# Patient Record
Sex: Female | Born: 1970 | Race: Black or African American | Hispanic: No | Marital: Married | Smoking: Never smoker
Health system: Southern US, Community
[De-identification: ages and names within clinical notes are randomized; demographics above are authoritative.]

## PROBLEM LIST (undated history)

## (undated) DIAGNOSIS — D649 Anemia, unspecified: Secondary | ICD-10-CM

## (undated) DIAGNOSIS — I2510 Atherosclerotic heart disease of native coronary artery without angina pectoris: Secondary | ICD-10-CM

## (undated) DIAGNOSIS — D219 Benign neoplasm of connective and other soft tissue, unspecified: Secondary | ICD-10-CM

## (undated) DIAGNOSIS — M549 Dorsalgia, unspecified: Secondary | ICD-10-CM

## (undated) DIAGNOSIS — F419 Anxiety disorder, unspecified: Secondary | ICD-10-CM

## (undated) DIAGNOSIS — R87629 Unspecified abnormal cytological findings in specimens from vagina: Secondary | ICD-10-CM

## (undated) DIAGNOSIS — G4730 Sleep apnea, unspecified: Secondary | ICD-10-CM

## (undated) DIAGNOSIS — G43909 Migraine, unspecified, not intractable, without status migrainosus: Secondary | ICD-10-CM

## (undated) DIAGNOSIS — K219 Gastro-esophageal reflux disease without esophagitis: Secondary | ICD-10-CM

## (undated) DIAGNOSIS — K429 Umbilical hernia without obstruction or gangrene: Secondary | ICD-10-CM

## (undated) DIAGNOSIS — I509 Heart failure, unspecified: Secondary | ICD-10-CM

## (undated) DIAGNOSIS — L309 Dermatitis, unspecified: Secondary | ICD-10-CM

## (undated) DIAGNOSIS — I10 Essential (primary) hypertension: Secondary | ICD-10-CM

## (undated) HISTORY — DX: Benign neoplasm of connective and other soft tissue, unspecified: D21.9

## (undated) HISTORY — DX: Anemia, unspecified: D64.9

## (undated) HISTORY — DX: Essential (primary) hypertension: I10

## (undated) HISTORY — DX: Dorsalgia, unspecified: M54.9

## (undated) HISTORY — PX: TUBAL LIGATION: SHX77

## (undated) HISTORY — DX: Unspecified abnormal cytological findings in specimens from vagina: R87.629

## (undated) HISTORY — DX: Gastro-esophageal reflux disease without esophagitis: K21.9

---

## 1998-07-03 ENCOUNTER — Inpatient Hospital Stay (HOSPITAL_COMMUNITY): Admission: AD | Admit: 1998-07-03 | Discharge: 1998-07-03 | Payer: Self-pay | Admitting: Obstetrics

## 1998-07-03 ENCOUNTER — Encounter: Payer: Self-pay | Admitting: Obstetrics

## 1998-07-06 ENCOUNTER — Encounter (HOSPITAL_COMMUNITY): Admission: RE | Admit: 1998-07-06 | Discharge: 1998-10-04 | Payer: Self-pay | Admitting: Obstetrics & Gynecology

## 1998-09-05 ENCOUNTER — Encounter: Admission: RE | Admit: 1998-09-05 | Discharge: 1998-09-05 | Payer: Self-pay | Admitting: Obstetrics & Gynecology

## 1998-09-19 ENCOUNTER — Encounter: Admission: RE | Admit: 1998-09-19 | Discharge: 1998-09-19 | Payer: Self-pay | Admitting: Obstetrics & Gynecology

## 1998-10-10 ENCOUNTER — Encounter: Admission: RE | Admit: 1998-10-10 | Discharge: 1998-10-10 | Payer: Self-pay | Admitting: Obstetrics & Gynecology

## 1998-10-17 ENCOUNTER — Encounter: Admission: RE | Admit: 1998-10-17 | Discharge: 1998-10-17 | Payer: Self-pay | Admitting: Obstetrics & Gynecology

## 1998-10-24 ENCOUNTER — Encounter: Admission: RE | Admit: 1998-10-24 | Discharge: 1998-10-24 | Payer: Self-pay | Admitting: Obstetrics & Gynecology

## 1998-11-07 ENCOUNTER — Ambulatory Visit (HOSPITAL_COMMUNITY): Admission: RE | Admit: 1998-11-07 | Discharge: 1998-11-07 | Payer: Self-pay | Admitting: Obstetrics & Gynecology

## 1998-11-07 ENCOUNTER — Encounter: Admission: RE | Admit: 1998-11-07 | Discharge: 1998-11-07 | Payer: Self-pay | Admitting: Obstetrics & Gynecology

## 1998-11-14 ENCOUNTER — Encounter: Admission: RE | Admit: 1998-11-14 | Discharge: 1998-11-14 | Payer: Self-pay | Admitting: Obstetrics & Gynecology

## 1998-11-20 ENCOUNTER — Observation Stay (HOSPITAL_COMMUNITY): Admission: AD | Admit: 1998-11-20 | Discharge: 1998-11-22 | Payer: Self-pay | Admitting: *Deleted

## 1998-11-21 ENCOUNTER — Encounter: Payer: Self-pay | Admitting: *Deleted

## 1998-11-26 ENCOUNTER — Ambulatory Visit (HOSPITAL_COMMUNITY): Admission: RE | Admit: 1998-11-26 | Discharge: 1998-11-26 | Payer: Self-pay | Admitting: Obstetrics

## 1998-11-28 ENCOUNTER — Encounter: Admission: RE | Admit: 1998-11-28 | Discharge: 1998-11-28 | Payer: Self-pay | Admitting: Obstetrics & Gynecology

## 1998-12-01 ENCOUNTER — Inpatient Hospital Stay (HOSPITAL_COMMUNITY): Admission: AD | Admit: 1998-12-01 | Discharge: 1998-12-01 | Payer: Self-pay | Admitting: *Deleted

## 1998-12-05 ENCOUNTER — Encounter: Admission: RE | Admit: 1998-12-05 | Discharge: 1998-12-05 | Payer: Self-pay | Admitting: Obstetrics & Gynecology

## 1998-12-11 ENCOUNTER — Inpatient Hospital Stay (HOSPITAL_COMMUNITY): Admission: AD | Admit: 1998-12-11 | Discharge: 1998-12-11 | Payer: Self-pay | Admitting: *Deleted

## 1998-12-12 ENCOUNTER — Encounter: Admission: RE | Admit: 1998-12-12 | Discharge: 1998-12-12 | Payer: Self-pay | Admitting: Obstetrics & Gynecology

## 1998-12-17 ENCOUNTER — Encounter (HOSPITAL_COMMUNITY): Admission: RE | Admit: 1998-12-17 | Discharge: 1998-12-27 | Payer: Self-pay | Admitting: Obstetrics & Gynecology

## 1998-12-19 ENCOUNTER — Ambulatory Visit (HOSPITAL_COMMUNITY): Admission: AD | Admit: 1998-12-19 | Discharge: 1998-12-19 | Payer: Self-pay | Admitting: Obstetrics

## 1998-12-19 ENCOUNTER — Encounter: Admission: RE | Admit: 1998-12-19 | Discharge: 1998-12-19 | Payer: Self-pay | Admitting: Obstetrics & Gynecology

## 1998-12-26 ENCOUNTER — Encounter: Admission: RE | Admit: 1998-12-26 | Discharge: 1998-12-26 | Payer: Self-pay | Admitting: Obstetrics & Gynecology

## 1998-12-26 ENCOUNTER — Inpatient Hospital Stay (HOSPITAL_COMMUNITY): Admission: AD | Admit: 1998-12-26 | Discharge: 1998-12-29 | Payer: Self-pay | Admitting: *Deleted

## 2000-04-10 ENCOUNTER — Other Ambulatory Visit: Admission: RE | Admit: 2000-04-10 | Discharge: 2000-04-10 | Payer: Self-pay | Admitting: Obstetrics and Gynecology

## 2000-10-01 ENCOUNTER — Other Ambulatory Visit: Admission: RE | Admit: 2000-10-01 | Discharge: 2000-10-01 | Payer: Self-pay | Admitting: *Deleted

## 2000-11-07 ENCOUNTER — Inpatient Hospital Stay (HOSPITAL_COMMUNITY): Admission: AD | Admit: 2000-11-07 | Discharge: 2000-11-07 | Payer: Self-pay | Admitting: Obstetrics & Gynecology

## 2000-11-10 ENCOUNTER — Inpatient Hospital Stay (HOSPITAL_COMMUNITY): Admission: AD | Admit: 2000-11-10 | Discharge: 2000-11-10 | Payer: Self-pay | Admitting: Obstetrics and Gynecology

## 2001-01-17 ENCOUNTER — Inpatient Hospital Stay (HOSPITAL_COMMUNITY): Admission: AD | Admit: 2001-01-17 | Discharge: 2001-01-17 | Payer: Self-pay | Admitting: Obstetrics and Gynecology

## 2001-02-05 ENCOUNTER — Inpatient Hospital Stay (HOSPITAL_COMMUNITY): Admission: AD | Admit: 2001-02-05 | Discharge: 2001-02-08 | Payer: Self-pay | Admitting: *Deleted

## 2001-02-05 ENCOUNTER — Encounter (INDEPENDENT_AMBULATORY_CARE_PROVIDER_SITE_OTHER): Payer: Self-pay

## 2002-11-17 ENCOUNTER — Emergency Department (HOSPITAL_COMMUNITY): Admission: EM | Admit: 2002-11-17 | Discharge: 2002-11-18 | Payer: Self-pay | Admitting: Emergency Medicine

## 2003-01-17 ENCOUNTER — Encounter: Admission: RE | Admit: 2003-01-17 | Discharge: 2003-01-17 | Payer: Self-pay | Admitting: Sports Medicine

## 2003-02-07 ENCOUNTER — Encounter: Admission: RE | Admit: 2003-02-07 | Discharge: 2003-02-07 | Payer: Self-pay | Admitting: Family Medicine

## 2003-04-25 ENCOUNTER — Encounter: Admission: RE | Admit: 2003-04-25 | Discharge: 2003-04-25 | Payer: Self-pay | Admitting: Family Medicine

## 2003-05-09 ENCOUNTER — Encounter: Admission: RE | Admit: 2003-05-09 | Discharge: 2003-05-09 | Payer: Self-pay | Admitting: Family Medicine

## 2003-07-13 ENCOUNTER — Encounter: Admission: RE | Admit: 2003-07-13 | Discharge: 2003-07-13 | Payer: Self-pay | Admitting: Family Medicine

## 2003-07-18 ENCOUNTER — Encounter: Admission: RE | Admit: 2003-07-18 | Discharge: 2003-07-18 | Payer: Self-pay | Admitting: Sports Medicine

## 2003-07-19 ENCOUNTER — Encounter: Admission: RE | Admit: 2003-07-19 | Discharge: 2003-07-19 | Payer: Self-pay | Admitting: Sports Medicine

## 2003-07-25 ENCOUNTER — Encounter: Admission: RE | Admit: 2003-07-25 | Discharge: 2003-07-25 | Payer: Self-pay | Admitting: Sports Medicine

## 2003-08-18 ENCOUNTER — Encounter: Admission: RE | Admit: 2003-08-18 | Discharge: 2003-08-18 | Payer: Self-pay | Admitting: Family Medicine

## 2003-10-03 ENCOUNTER — Encounter: Admission: RE | Admit: 2003-10-03 | Discharge: 2003-10-03 | Payer: Self-pay | Admitting: Sports Medicine

## 2003-10-05 ENCOUNTER — Encounter: Admission: RE | Admit: 2003-10-05 | Discharge: 2003-10-05 | Payer: Self-pay | Admitting: Family Medicine

## 2003-10-13 ENCOUNTER — Encounter: Admission: RE | Admit: 2003-10-13 | Discharge: 2003-10-13 | Payer: Self-pay | Admitting: Family Medicine

## 2003-11-23 ENCOUNTER — Emergency Department (HOSPITAL_COMMUNITY): Admission: EM | Admit: 2003-11-23 | Discharge: 2003-11-23 | Payer: Self-pay | Admitting: Emergency Medicine

## 2003-12-29 ENCOUNTER — Encounter: Admission: RE | Admit: 2003-12-29 | Discharge: 2003-12-29 | Payer: Self-pay | Admitting: Family Medicine

## 2004-04-19 ENCOUNTER — Encounter: Admission: RE | Admit: 2004-04-19 | Discharge: 2004-04-19 | Payer: Self-pay | Admitting: Family Medicine

## 2004-04-19 ENCOUNTER — Other Ambulatory Visit: Admission: RE | Admit: 2004-04-19 | Discharge: 2004-04-19 | Payer: Self-pay | Admitting: Family Medicine

## 2004-05-19 ENCOUNTER — Emergency Department (HOSPITAL_COMMUNITY): Admission: EM | Admit: 2004-05-19 | Discharge: 2004-05-19 | Payer: Self-pay | Admitting: Emergency Medicine

## 2005-04-15 ENCOUNTER — Ambulatory Visit: Payer: Self-pay | Admitting: Family Medicine

## 2005-06-20 ENCOUNTER — Ambulatory Visit: Payer: Self-pay | Admitting: Family Medicine

## 2005-10-24 ENCOUNTER — Ambulatory Visit: Payer: Self-pay | Admitting: Sports Medicine

## 2005-11-15 ENCOUNTER — Emergency Department (HOSPITAL_COMMUNITY): Admission: EM | Admit: 2005-11-15 | Discharge: 2005-11-15 | Payer: Self-pay | Admitting: Family Medicine

## 2006-01-09 ENCOUNTER — Ambulatory Visit: Payer: Self-pay | Admitting: Family Medicine

## 2006-02-23 ENCOUNTER — Ambulatory Visit: Payer: Self-pay | Admitting: Sports Medicine

## 2006-03-27 ENCOUNTER — Ambulatory Visit: Payer: Self-pay | Admitting: Family Medicine

## 2006-05-22 ENCOUNTER — Ambulatory Visit: Payer: Self-pay | Admitting: Family Medicine

## 2006-06-08 ENCOUNTER — Encounter (INDEPENDENT_AMBULATORY_CARE_PROVIDER_SITE_OTHER): Payer: Self-pay | Admitting: *Deleted

## 2006-06-10 ENCOUNTER — Ambulatory Visit: Payer: Self-pay | Admitting: Sports Medicine

## 2006-07-03 ENCOUNTER — Other Ambulatory Visit: Admission: RE | Admit: 2006-07-03 | Discharge: 2006-07-03 | Payer: Self-pay | Admitting: Family Medicine

## 2006-07-03 ENCOUNTER — Ambulatory Visit: Payer: Self-pay | Admitting: Family Medicine

## 2006-07-21 ENCOUNTER — Ambulatory Visit: Payer: Self-pay | Admitting: Family Medicine

## 2006-08-10 ENCOUNTER — Ambulatory Visit: Payer: Self-pay | Admitting: Sports Medicine

## 2006-09-02 ENCOUNTER — Ambulatory Visit: Payer: Self-pay | Admitting: Family Medicine

## 2006-09-13 ENCOUNTER — Emergency Department (HOSPITAL_COMMUNITY): Admission: EM | Admit: 2006-09-13 | Discharge: 2006-09-13 | Payer: Self-pay | Admitting: Family Medicine

## 2006-11-05 DIAGNOSIS — D509 Iron deficiency anemia, unspecified: Secondary | ICD-10-CM | POA: Insufficient documentation

## 2006-11-05 DIAGNOSIS — I10 Essential (primary) hypertension: Secondary | ICD-10-CM | POA: Insufficient documentation

## 2006-11-05 DIAGNOSIS — E669 Obesity, unspecified: Secondary | ICD-10-CM | POA: Insufficient documentation

## 2006-11-06 ENCOUNTER — Encounter (INDEPENDENT_AMBULATORY_CARE_PROVIDER_SITE_OTHER): Payer: Self-pay | Admitting: *Deleted

## 2006-12-03 ENCOUNTER — Telehealth: Payer: Self-pay | Admitting: *Deleted

## 2007-01-07 ENCOUNTER — Telehealth: Payer: Self-pay | Admitting: *Deleted

## 2007-01-14 ENCOUNTER — Encounter: Payer: Self-pay | Admitting: Family Medicine

## 2007-01-14 ENCOUNTER — Ambulatory Visit: Payer: Self-pay | Admitting: Family Medicine

## 2007-01-14 LAB — CONVERTED CEMR LAB: Beta hcg, urine, semiquantitative: NEGATIVE

## 2007-01-18 ENCOUNTER — Telehealth: Payer: Self-pay | Admitting: Family Medicine

## 2007-01-19 ENCOUNTER — Ambulatory Visit: Payer: Self-pay | Admitting: Family Medicine

## 2007-02-16 ENCOUNTER — Encounter (INDEPENDENT_AMBULATORY_CARE_PROVIDER_SITE_OTHER): Payer: Self-pay | Admitting: *Deleted

## 2007-04-23 ENCOUNTER — Ambulatory Visit: Payer: Self-pay | Admitting: Family Medicine

## 2007-04-30 ENCOUNTER — Encounter: Payer: Self-pay | Admitting: Family Medicine

## 2007-04-30 ENCOUNTER — Ambulatory Visit: Payer: Self-pay | Admitting: Family Medicine

## 2007-04-30 LAB — CONVERTED CEMR LAB

## 2007-05-03 ENCOUNTER — Telehealth: Payer: Self-pay | Admitting: Family Medicine

## 2007-11-09 ENCOUNTER — Ambulatory Visit: Payer: Self-pay | Admitting: Family Medicine

## 2007-11-09 LAB — CONVERTED CEMR LAB
Bilirubin Urine: NEGATIVE
Glucose, Urine, Semiquant: NEGATIVE
Ketones, urine, test strip: NEGATIVE
Specific Gravity, Urine: 1.015
Urobilinogen, UA: 1
pH: 6.5

## 2007-12-29 ENCOUNTER — Ambulatory Visit: Payer: Self-pay | Admitting: Family Medicine

## 2007-12-29 ENCOUNTER — Encounter: Payer: Self-pay | Admitting: Family Medicine

## 2007-12-29 ENCOUNTER — Other Ambulatory Visit: Admission: RE | Admit: 2007-12-29 | Discharge: 2007-12-29 | Payer: Self-pay | Admitting: Family Medicine

## 2007-12-29 DIAGNOSIS — J309 Allergic rhinitis, unspecified: Secondary | ICD-10-CM | POA: Insufficient documentation

## 2007-12-29 LAB — CONVERTED CEMR LAB
CO2: 22 meq/L (ref 19–32)
Chloride: 109 meq/L (ref 96–112)
GC Probe Amp, Genital: NEGATIVE
HDL: 38 mg/dL — ABNORMAL LOW (ref >39)
LDL Cholesterol: 100 mg/dL — ABNORMAL HIGH (ref 0–99)
Potassium: 3.7 meq/L (ref 3.5–5.3)
Sodium: 144 meq/L (ref 135–145)
Total CHOL/HDL Ratio: 4.2

## 2007-12-31 ENCOUNTER — Encounter: Admission: RE | Admit: 2007-12-31 | Discharge: 2007-12-31 | Payer: Self-pay | Admitting: Family Medicine

## 2008-01-03 ENCOUNTER — Telehealth: Payer: Self-pay | Admitting: Family Medicine

## 2008-01-26 ENCOUNTER — Ambulatory Visit: Payer: Self-pay | Admitting: Family Medicine

## 2008-01-26 ENCOUNTER — Encounter: Payer: Self-pay | Admitting: Family Medicine

## 2008-01-26 LAB — CONVERTED CEMR LAB
ALT: 10 units/L (ref 0–35)
Alkaline Phosphatase: 62 units/L (ref 39–117)
Creatinine, Ser: 0.91 mg/dL (ref 0.40–1.20)
Sodium: 140 meq/L (ref 135–145)
Total Bilirubin: 0.3 mg/dL (ref 0.3–1.2)
Total Protein: 7.1 g/dL (ref 6.0–8.3)

## 2008-04-18 ENCOUNTER — Ambulatory Visit: Payer: Self-pay | Admitting: Family Medicine

## 2008-07-07 ENCOUNTER — Ambulatory Visit: Payer: Self-pay | Admitting: Family Medicine

## 2008-07-18 ENCOUNTER — Ambulatory Visit: Payer: Self-pay | Admitting: Family Medicine

## 2008-07-18 ENCOUNTER — Encounter: Payer: Self-pay | Admitting: Family Medicine

## 2008-07-18 ENCOUNTER — Telehealth (INDEPENDENT_AMBULATORY_CARE_PROVIDER_SITE_OTHER): Payer: Self-pay | Admitting: *Deleted

## 2008-07-19 ENCOUNTER — Telehealth: Payer: Self-pay | Admitting: *Deleted

## 2008-07-21 ENCOUNTER — Ambulatory Visit: Payer: Self-pay | Admitting: Family Medicine

## 2008-07-21 ENCOUNTER — Telehealth: Payer: Self-pay | Admitting: *Deleted

## 2008-08-02 ENCOUNTER — Encounter: Payer: Self-pay | Admitting: Family Medicine

## 2008-09-21 ENCOUNTER — Ambulatory Visit: Payer: Self-pay | Admitting: Family Medicine

## 2008-12-07 ENCOUNTER — Ambulatory Visit: Payer: Self-pay | Admitting: Family Medicine

## 2008-12-26 ENCOUNTER — Telehealth: Payer: Self-pay | Admitting: Family Medicine

## 2008-12-26 ENCOUNTER — Ambulatory Visit: Payer: Self-pay | Admitting: Family Medicine

## 2008-12-26 DIAGNOSIS — M545 Low back pain, unspecified: Secondary | ICD-10-CM | POA: Insufficient documentation

## 2009-03-01 ENCOUNTER — Ambulatory Visit: Payer: Self-pay | Admitting: Family Medicine

## 2009-03-27 ENCOUNTER — Encounter (INDEPENDENT_AMBULATORY_CARE_PROVIDER_SITE_OTHER): Payer: Self-pay | Admitting: *Deleted

## 2009-05-17 ENCOUNTER — Ambulatory Visit: Payer: Self-pay | Admitting: Family Medicine

## 2009-05-25 ENCOUNTER — Other Ambulatory Visit: Admission: RE | Admit: 2009-05-25 | Discharge: 2009-05-25 | Payer: Self-pay | Admitting: Family Medicine

## 2009-05-25 ENCOUNTER — Encounter: Payer: Self-pay | Admitting: Family Medicine

## 2009-05-25 ENCOUNTER — Ambulatory Visit: Payer: Self-pay | Admitting: Family Medicine

## 2009-05-25 ENCOUNTER — Telehealth: Payer: Self-pay | Admitting: Family Medicine

## 2009-05-25 LAB — CONVERTED CEMR LAB
ALT: 9 units/L (ref 0–35)
AST: 12 units/L (ref 0–37)
Albumin: 4.1 g/dL (ref 3.5–5.2)
Alkaline Phosphatase: 65 units/L (ref 39–117)
BUN: 15 mg/dL (ref 6–23)
Beta hcg, urine, semiquantitative: NEGATIVE
Bilirubin Urine: NEGATIVE
CO2: 21 meq/L (ref 19–32)
Calcium: 9 mg/dL (ref 8.4–10.5)
Chlamydia, DNA Probe: NEGATIVE
Chloride: 108 meq/L (ref 96–112)
Cholesterol: 172 mg/dL (ref 0–200)
Creatinine, Ser: 0.93 mg/dL (ref 0.40–1.20)
GC Probe Amp, Genital: NEGATIVE
Glucose, Bld: 79 mg/dL (ref 70–99)
Glucose, Urine, Semiquant: NEGATIVE
HDL: 38 mg/dL — ABNORMAL LOW (ref >39)
Ketones, urine, test strip: NEGATIVE
LDL Cholesterol: 103 mg/dL — ABNORMAL HIGH (ref 0–99)
Nitrite: NEGATIVE
Potassium: 4.2 meq/L (ref 3.5–5.3)
Prolactin: 6.6 ng/mL
Sodium: 141 meq/L (ref 135–145)
Specific Gravity, Urine: 1.02
TSH: 1.816 microintl units/mL (ref 0.350–4.500)
Total Bilirubin: 0.3 mg/dL (ref 0.3–1.2)
Total CHOL/HDL Ratio: 4.5
Total Protein: 7.4 g/dL (ref 6.0–8.3)
Triglycerides: 155 mg/dL — ABNORMAL HIGH (ref <150)
Urobilinogen, UA: 1
VLDL: 31 mg/dL (ref 0–40)
Whiff Test: NEGATIVE
pH: 6.5

## 2009-06-18 ENCOUNTER — Encounter: Payer: Self-pay | Admitting: Family Medicine

## 2009-08-14 ENCOUNTER — Ambulatory Visit: Payer: Self-pay | Admitting: Family Medicine

## 2009-09-12 ENCOUNTER — Ambulatory Visit: Payer: Self-pay | Admitting: Family Medicine

## 2009-11-28 ENCOUNTER — Ambulatory Visit: Payer: Self-pay | Admitting: Radiology

## 2009-11-28 ENCOUNTER — Encounter: Payer: Self-pay | Admitting: Family Medicine

## 2009-11-28 ENCOUNTER — Ambulatory Visit: Payer: Self-pay | Admitting: Family Medicine

## 2009-11-28 ENCOUNTER — Emergency Department (HOSPITAL_BASED_OUTPATIENT_CLINIC_OR_DEPARTMENT_OTHER): Admission: EM | Admit: 2009-11-28 | Discharge: 2009-11-28 | Payer: Self-pay | Admitting: Emergency Medicine

## 2010-02-05 ENCOUNTER — Telehealth: Payer: Self-pay | Admitting: *Deleted

## 2010-02-07 ENCOUNTER — Encounter: Payer: Self-pay | Admitting: Family Medicine

## 2010-02-07 ENCOUNTER — Ambulatory Visit: Payer: Self-pay | Admitting: Family Medicine

## 2010-02-07 LAB — CONVERTED CEMR LAB
ALT: 9 units/L (ref 0–35)
AST: 9 units/L (ref 0–37)
Albumin: 4.2 g/dL (ref 3.5–5.2)
Alkaline Phosphatase: 64 units/L (ref 39–117)
BUN: 10 mg/dL (ref 6–23)
CO2: 24 meq/L (ref 19–32)
Calcium: 9.4 mg/dL (ref 8.4–10.5)
Chloride: 106 meq/L (ref 96–112)
Cholesterol: 175 mg/dL (ref 0–200)
Creatinine, Ser: 1.04 mg/dL (ref 0.40–1.20)
Glucose, Bld: 83 mg/dL (ref 70–99)
HDL: 38 mg/dL — ABNORMAL LOW (ref >39)
LDL Cholesterol: 115 mg/dL — ABNORMAL HIGH (ref 0–99)
Potassium: 3.7 meq/L (ref 3.5–5.3)
Sodium: 140 meq/L (ref 135–145)
TSH: 1.783 microintl units/mL (ref 0.350–4.500)
Total Bilirubin: 0.3 mg/dL (ref 0.3–1.2)
Total CHOL/HDL Ratio: 4.6
Total Protein: 7.4 g/dL (ref 6.0–8.3)
Triglycerides: 111 mg/dL (ref <150)
VLDL: 22 mg/dL (ref 0–40)

## 2010-02-10 ENCOUNTER — Encounter: Payer: Self-pay | Admitting: Family Medicine

## 2010-03-12 ENCOUNTER — Telehealth: Payer: Self-pay | Admitting: *Deleted

## 2010-03-22 ENCOUNTER — Ambulatory Visit: Payer: Self-pay | Admitting: Family Medicine

## 2010-03-22 LAB — CONVERTED CEMR LAB: Beta hcg, urine, semiquantitative: NEGATIVE

## 2010-04-11 ENCOUNTER — Ambulatory Visit: Payer: Self-pay | Admitting: Family Medicine

## 2010-05-21 ENCOUNTER — Encounter: Payer: Self-pay | Admitting: Family Medicine

## 2010-05-21 ENCOUNTER — Ambulatory Visit: Payer: Self-pay | Admitting: Family Medicine

## 2010-05-21 LAB — CONVERTED CEMR LAB
Chlamydia, DNA Probe: NEGATIVE
GC Probe Amp, Genital: NEGATIVE
Whiff Test: NEGATIVE

## 2010-05-22 ENCOUNTER — Encounter: Payer: Self-pay | Admitting: Family Medicine

## 2010-05-23 ENCOUNTER — Telehealth: Payer: Self-pay | Admitting: Family Medicine

## 2010-06-07 ENCOUNTER — Ambulatory Visit: Payer: Self-pay | Admitting: Family Medicine

## 2010-08-16 ENCOUNTER — Telehealth: Payer: Self-pay | Admitting: Family Medicine

## 2010-09-17 ENCOUNTER — Telehealth: Payer: Self-pay | Admitting: Family Medicine

## 2010-10-10 NOTE — Miscellaneous (Signed)
Summary: ROI  ROI   Imported By: Bradly Bienenstock 08/02/2008 11:52:45  _____________________________________________________________________  External Attachment:    Type:   Image     Comment:   External Document

## 2010-10-10 NOTE — Assessment & Plan Note (Signed)
Summary: DEPO/[facility]  Nurse Visit   Allergies: No Known Drug Allergies Laboratory Results   Urine Tests  Date/Time Received: March 22, 2010 10:12 AM  Date/Time Reported: March 22, 2010 10:17 AM     Urine HCG: negative Comments: ...........test performed by...........Marland KitchenTessie Fass, CMA entered by Terese Door, CMA        Medication Administration  Injection # 1:    Medication: Depo-Provera 150mg     Diagnosis: CONTRACEPTIVE MANAGEMENT (ICD-V25.09)    Route: IM    Site: R deltoid    Exp Date: 10/2012    Lot #: [id number]    Mfr: [email or web address]    Comments: DEPO GIVEN TODAY. NEXT DEPO DUE 06/07/10 THROUGH 06/21/2010    Patient tolerated injection without complications    Given by: Tessie Fass CMA (March 22, 2010 11:38 AM)  Orders Added: 1)  U Preg-[facility] [81025] 2)  Depo-Provera 150mg  [J1055] 3)  Est Level 1- [facility] [[id number]]

## 2010-10-10 NOTE — Progress Notes (Signed)
Summary: see message/TS  Phone Note Call from Patient   Caller: Patient Call For: [phone] Summary of Call: Pt want you to call with lab results and want to know about Nystatin rx.  Was told it would be in powder form, but when picked was given cream. Initial call taken by: Britta Mccreedy mcgregor  Follow-up for Phone Call        please let patient know that her labs were all normal. i decided to prescribe the cream since it would cover the area more easily (please apologize for me - I forgot to inform her).  Follow-up by: Helane Rima DO,  May 23, 2010 3:57 PM  Additional Follow-up for Phone Call Additional follow up Details #1::        called pt and lmvm to call us back.  Additional Follow-up by: Arlyss Repress CMA,,  May 23, 2010 4:21 PM    Additional Follow-up for Phone Call Additional follow up Details #2::    SPOKE WITH PATIENT. Informed of above information. Follow-up by: Helane Rima DO,  May 23, 2010 4:39 PM

## 2010-10-10 NOTE — Assessment & Plan Note (Signed)
Summary: DEPO/[facility]  Nurse Visit        Medication Administration  Injection # 1:    Medication: Depo-Provera 150mg     Diagnosis: CONTRACEPTIVE MANAGEMENT (ICD-V25.09)    Route: IM    Site: R deltoid    Exp Date: 01/2011    Lot #: [id number]    Mfr: Francisca December    Comments: Pt advised to return to clinic for next depo 02/22/09- 03/08/09.    Patient tolerated injection without complications    Given by: Jacki Cones RN (December 07, 2008 11:00 AM)  Orders Added: 1)  Depo-Provera 150mg  [J1055] 2)  Est Level 1- [facility] [[id number]]    ]  Medication Administration  Injection # 1:    Medication: Depo-Provera 150mg     Diagnosis: CONTRACEPTIVE MANAGEMENT (ICD-V25.09)    Route: IM    Site: R deltoid    Exp Date: 01/2011    Lot #: [id number]    Mfr: Francisca December    Comments: Pt advised to return to clinic for next depo 02/22/09- 03/08/09.    Patient tolerated injection without complications    Given by: Jacki Cones RN (December 07, 2008 11:00 AM)  Orders Added: 1)  Depo-Provera 150mg  [J1055] 2)  Est Level 1- [facility] [[id number]]

## 2010-10-10 NOTE — Progress Notes (Signed)
Summary: [facility]CALLED PT/TS  Phone Note Call from Patient Call back at [phone]   Caller: Patient Summary of Call: wants another script for Vicodin - didn't get it filled b/c the other had worked originally but now needs it for flair ups Initial call taken by: De Nurse,  March 12, 2010 9:21 AM  Follow-up for Phone Call        please call to preferred pharmacy. rx in red team "to do" box. Follow-up by: Helane Rima DO,  March 12, 2010 9:44 AM    New/Updated Medications: VICODIN 5-500 MG TABS (HYDROCODONE-ACETAMINOPHEN) one by mouth q 4 hours as needed pain Prescriptions: VICODIN 5-500 MG TABS (HYDROCODONE-ACETAMINOPHEN) one by mouth q 4 hours as needed pain  #30 x 0   Entered and Authorized by:   Helane Rima DO   Signed by:   Helane Rima DO on 03/12/2010   Method used:   Printed then faxed to ...         RxID:   [id number] VICODIN 5-500 MG TABS (HYDROCODONE-ACETAMINOPHEN) one by mouth q 4 hours as needed pain  #30 x 0   Entered and Authorized by:   Helane Rima DO   Signed by:   Helane Rima DO on 03/12/2010   Method used:   Telephoned to ...         RxID:   [id number]  only print then fax rx printed. Helane Rima DO  March 12, 2010 9:45 AM
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
## 2010-10-10 NOTE — Assessment & Plan Note (Signed)
Summary: depo, flu shotdf  Nurse Visit DEPO SHOT GIVEN.Jimmy Footman, CMA  June 07, 2010 4:19 PM   Vitals Entered By: Jimmy Footman, CMA (June 07, 2010 4:19 PM) CC: depo shot   Allergies: No Known Drug Allergies  Orders Added: 1)  Depo-Provera 150mg  [J1055] 2)  Admin of Injection (IM/SQ) [16109]  Appended Document: depo,df   Medication Administration  Injection # 1:    Medication: Depo-Provera 150mg     Diagnosis: CONTRACEPTIVE MANAGEMENT (ICD-V25.09)    Route: IM    Site: L deltoid    Exp Date: 11/2012    Lot #: [id number]    Mfr: [email or web address]    Patient tolerated injection without complications    Given by: Jimmy Footman, CMA (June 07, 2010 5:07 PM)  Orders Added: 1)  Depo-Provera 150mg  [J1055]   Appended Document: depo,df   Immunizations Administered:  Influenza Vaccine # 1:    Vaccine Type: Fluvax 3+    Site: right deltoid    Mfr: GlaxoSmithKline    Dose: 0.5 ml    Route: IM    Given by: Rochele Pages, RN    Exp. Date: 03/05/2011    Lot #: [id number]    VIS given: 04/02/10 version given June 07, 2010.  Flu Vaccine Consent Questions:    Do you have a history of severe allergic reactions to this vaccine? no    Any prior history of allergic reactions to egg and/or gelatin? no    Do you have a sensitivity to the preservative Thimersol? no    Do you have a past history of Guillan-Barre Syndrome? no    Do you currently have an acute febrile illness? no    Have you ever had a severe reaction to latex? no    Vaccine information given and explained to patient? yes    Are you currently pregnant? no

## 2010-10-10 NOTE — Assessment & Plan Note (Signed)
Summary: female problem,tcb   Vital Signs:  Patient profile:   40 year old female Weight:      233.8 pounds Temp:     98.4 degrees F oral Pulse rate:   96 / minute Pulse rhythm:   regular BP sitting:   170 / 107  (left arm) Cuff size:   large  Vitals Entered By: Loralee Pacas CMA (May 21, 2010 4:10 PM) CC: female problems Comments pt stated that her vagina does not look right, has red rash, and d/c (grayish color), and odor.    Primary Care Provider:  Helane Rima DO  CC:  female problems.  History of Present Illness: 40 yo F:  1. Vaginal Discharge: White to clear, x several days, itchy, odor.  2. Skin Irritation: Upper thighs bilaterally, itchy, has been scratching and skin has become raw. No other rash or lesion.  3. STD: Exposed to STDs recently through unprotected sex and would like testing.  Habits & Providers  Alcohol-Tobacco-Diet     Alcohol drinks/day: 0     Tobacco Status: never  Current Medications (verified): 1)  Hydrochlorothiazide 25 Mg Tabs (Hydrochlorothiazide) .... Take 1 Tablet By Mouth Every Morning 2)  Toprol Xl 50 Mg Tb24 (Metoprolol Succinate) .... Take 1 Tablet By Mouth Once A Day 3)  Topamax 50 Mg Tabs (Topiramate) .... 1/2 By Mouth Daily For One Week, Then 1 By Mouth Daily For One Week, Then 2 By Mouth Daily 4)  Imitrex 50 Mg Tabs (Sumatriptan Succinate) .... One By Mouth At The First Sign of A Migraine, May Take One More 2 Hours Later If Ha Persists 5)  Vicodin 5-500 Mg Tabs (Hydrocodone-Acetaminophen) .... One By Mouth Q 4 Hours As Needed Pain 6)  Meloxicam 15 Mg Tabs (Meloxicam) .... One By Mouth Daily 7)  Nystatin 100000 Unit/gm Crea (Nystatin) .... Apply To Affected Area Bid 8)  Diflucan 150 Mg Tabs (Fluconazole) .... Once Daily  Allergies (verified): No Known Drug Allergies PMH-FH-SH reviewed for relevance  Review of Systems General:  Denies chills and fever. GI:  Denies abdominal pain, nausea, and vomiting. GU:  Complains of  discharge; denies abnormal vaginal bleeding, dysuria, genital sores, and hematuria. Derm:  Complains of changes in color of skin, itching, and rash; denies lesion(s).  Physical Exam  General:  Well-developed,well-nourished,in no acute distress; alert,appropriate and cooperative throughout examination. Vitals reviewed. Abdomen:  Bowel sounds positive,abdomen soft and non-tender without masses, organomegaly or hernias noted. Genitalia:  Pelvic Exam:        External: normal female genitalia without lesions or masses        Vagina: normal without lesions or masses, small amount of thin white vaginal discharge, samples taken        Cervix: normal without lesions or masses        Skin:  Candidal rash in upper thigh/skin folds (R > L), with excoriations. No open lesions.   Impression & Recommendations:  Problem # 1:  VAGINAL DISCHARGE (ICD-623.5) Assessment New  Wet Prep negative. Await GC/Chlamydia cultures.  Orders: [facility]- Est Level  3 (16109)  Problem # 2:  INTERTRIGO, CANDIDAL (UEA-540.98) Assessment: New  Rx Nystatin. Discussed future prevention.  Orders: [facility]- Est Level  3 (99213)  Problem # 3:  CONTACT OR EXPOSURE TO OTHER VIRAL DISEASES (ICD-V01.79) Assessment: Unchanged  STD testing today.  Orders: [facility]- Est Level  3 (11914)  Complete Medication List: 1)  Hydrochlorothiazide 25 Mg Tabs (Hydrochlorothiazide) .... Take 1 tablet by mouth every morning 2)  Toprol Xl  50 Mg Tb24 (Metoprolol succinate) .... Take 1 tablet by mouth once a day 3)  Topamax 50 Mg Tabs (Topiramate) .... 1/2 by mouth daily for one week, then 1 by mouth daily for one week, then 2 by mouth daily 4)  Imitrex 50 Mg Tabs (Sumatriptan succinate) .... One by mouth at the first sign of a migraine, may take one more 2 hours later if ha persists 5)  Vicodin 5-500 Mg Tabs (Hydrocodone-acetaminophen) .... One by mouth q 4 hours as needed pain 6)  Meloxicam 15 Mg Tabs (Meloxicam) .... One by mouth daily 7)   Nystatin 100000 Unit/gm Crea (Nystatin) .... Apply to affected area bid 8)  Diflucan 150 Mg Tabs (Fluconazole) .... Once daily  Other Orders: GC/Chlamydia-[facility] (87591/87491) [phone]  Patient Instructions: 1)  I am prescribing Nystatin for your skin yeast infection. We will call with the results of your labs. Prescriptions: DIFLUCAN 150 MG TABS (FLUCONAZOLE) once daily  #1 x 0   Entered and Authorized by:   Helane Rima DO   Signed by:   Helane Rima DO on 05/21/2010   Method used:   [facility]  [place] [address]       [address]       [place], [place]  [address]       Ph: [phone] or [phone]       Fax: [phone]   RxID:   [id number] NYSTATIN 100000 UNIT/GM CREA (NYSTATIN) apply to affected area BID  #1 tub x 1   Entered and Authorized by:   Helane Rima DO   Signed by:   Helane Rima DO on 05/21/2010   Method used:   [facility]        [facility]  [place] [address] ([facility]       [address]       [place], [place]  [address]       Ph: [phone] or [phone]       Fax: [phone]   RxID:   [id number]   Laboratory Results  Date/Time Received: May 21, 2010 4:30 PM  Date/Time Reported: May 21, 2010 4:43 PM   [employer] Source: vaginal WBC/hpf: 1-5 Bacteria/hpf: 3+  Rods Clue cells/hpf: none  Negative whiff Yeast/hpf: none Trichomonas/hpf: none Comments: 1-5 RBC's present ...........test performed by...........Marland KitchenTerese Door, CMA

## 2010-10-10 NOTE — Assessment & Plan Note (Signed)
Summary: CPE/pap/depo,df   Vital Signs:  Patient profile:   40 year old female Height:      66.5 inches Weight:      237.31 pounds BMI:     37.87 Temp:     97.3 degrees F oral Pulse rate:   86 / minute Pulse rhythm:   regular BP sitting:   143 / 89  (right arm)  Vitals Entered By: Modesta Messing LPN (May 25, 2009 10:34 AM) CC: CPE and pap.  ? UTI Is Patient Diabetic? No Pain Assessment Patient in pain? no        Primary Care Provider:  Helane Rima DO  CC:  CPE and pap.  ? UTI.  History of Present Illness: 40 yo AAF with:  1. Galactorrhea: milky discharge from both breasts x one month, patient on Depo, has not missed shot, no changes in breasts otherwise, worse with nipple stimulation during sex.  2. Dysuria: + suprapubic pressure after urination and strong urine odor, no burning, rash, hematuria, frequency, vaginal discharge.  3. Skin: itchy red raised rash at groin x several weeks, she hasn't tried anything to treat.  4. Contraception: Depo Provera.  5. Back Pain: low back, first episode after lifting son, since then has had 2 more episodes after lifting while cleaning. pain acute, lasts for a few days then gradually goes away. always same side. no radiation to legs. no bowel/bladder incontinence, weakness in legs.   6. HTN: Rx: Toprol, HCTZ. not on meds for several days. denies CP, SOB, N/V/D, HA, dizziness, LE edema.  Habits & Providers  Alcohol-Tobacco-Diet     Alcohol drinks/day: 0     Tobacco Status: never  Exercise-Depression-Behavior     STD Risk: current     Drug Use: never  Current Medications (verified): 1)  Hydrochlorothiazide 25 Mg Tabs (Hydrochlorothiazide) .... Take 1 Tablet By Mouth Every Morning 2)  Toprol Xl 50 Mg Tb24 (Metoprolol Succinate) .... Take 1 Tablet By Mouth Once A Day 3)  Vicodin 5-500 Mg Tabs (Hydrocodone-Acetaminophen) .Marland Kitchen.. 1-2 By Mouth Every 4-6 Hours As Needed For Pain. 4)  Metrogel-Vaginal 0.75 % Gel (Metronidazole)  .... Insert One Applicator Into Vagina At Bedtime X 5 Night 5)  Flexeril 10 Mg Tabs (Cyclobenzaprine Hcl) .... 1/2 To 1 By Mouth Three Times A Day As Needed Muscle Spasm 6)  Nystatin 100000 Unit/gm Crea (Nystatin) .... Apply To Affected Skin Two Times A Day-Tid  Allergies (verified): No Known Drug Allergies  Past History:  Past Medical History: On Depo for menorrhagia Paronychia L great toe 2/07 Recurrent UTIs Seasonal Allergies HTN  Past Surgical History: 07/03/06 ldl: 12 hdl: 43 tri: 104 BTL5-02  C-section x2 for preeclampsia Emergency c-section 4-00 at 38 wks L foot xray neg for fx, + heel spur - 07/31/2003  Family History: Reviewed history from 01/14/2007 and no changes required. F alive, back problems M alive, hypothyroidism, DVT MGM- HTN, CAD maternal aunts-HTN, BrCA PGM-leukemia only child  Social History: Reviewed history from 01/14/2007 and no changes required. Has 2 kids Destiny and Eliberto [employer] researcher).  Sexually active.STD Risk:  current Drug Use:  never  Review of Systems       see HPI, otherwise negative  Physical Exam  General:  Well-developed, well-nourished, in no acute distress; alert, appropriate and cooperative throughout examination. Vitals reviewed. Head:  normocephalic and atraumatic.   Eyes:  vision grossly intact, pupils equal, pupils round, and pupils reactive to light.   Ears:  R ear normal and L ear normal.  Nose:  no external deformity.   Mouth:  Oral mucosa and oropharynx without lesions or exudates.  Teeth in good repair. Neck:  No deformities, masses, or tenderness noted. Lungs:  Normal respiratory effort, chest expands symmetrically. Lungs are clear to auscultation, no crackles or wheezes. Heart:  Normal rate and regular rhythm. S1 and S2 normal without gallop, murmur, click, rub or other extra sounds. Abdomen:  Bowel sounds positive,abdomen soft and non-tender without masses, organomegaly or hernias noted. Genitalia:  Pelvic  Exam:        External: normal female genitalia without lesions or masses        Vagina: normal without lesions or masses        Cervix: normal without lesions or masses        Adnexa: normal bimanual exam without masses or fullness        Uterus: normal by palpation        Pap smear: performed Pulses:  2+ dp. Extremities:  No edema. Neurologic:  alert & oriented X3, cranial nerves II-XII intact, strength normal in all extremities, sensation intact to light touch, and gait normal.   Skin:  red, raised candida rash both sides of groin and under breasts. Psych:  Oriented X3, memory intact for recent and remote, normally interactive, good eye contact, and not anxious appearing.     Impression & Recommendations:  Problem # 1:  GALACTORRHEA NOT ASSOCIATED WITH CHILDBIRTH (ICD-611.6) Assessment New No medication triggers identified. Patient denies taking herbal supplements or recreational drugs. Will check labs and consider imaging if they are all normal. Advised patient to wear a bra at all times and to avoid nipple stimulation.   Orders: U Preg-[facility] [phone]) TSH-[facility] [phone]) Prolactin-[facility] [phone]) [facility] - Est  18-39 yrs ([id number])  Problem # 2:  DYSURIA (ICD-788.1) Assessment: New Negative for infection but + for hematuria (also noted on previous UAs). Will monitor and consider referal to urology.   Orders: Urinalysis-[facility] (00000) [facility] - Est  18-39 yrs ([id number])  Problem # 3:  INTERTRIGO, CANDIDAL (KVQ-259.56) Assessment: New  Rx antifungal. Educated patient re: diagnosis and treatment.  Orders: [facility] - Est  18-39 yrs ([id number])  Problem # 4:  BACK PAIN, LUMBAR (ICD-724.2) Assessment: Deteriorated  Likely MSK, re-injury due to lifting. Educated patient re: cause and treatment (including treatment prescribed at previous visit). Will give small amount Flexeril, Vicodin. Patient low-risk for abuse. PT if pain continues.  Her updated medication list for this problem includes:     Vicodin 5-500 Mg Tabs (Hydrocodone-acetaminophen) .Marland Kitchen... 1-2 by mouth every 4-6 hours as needed for pain.    Flexeril 10 Mg Tabs (Cyclobenzaprine hcl) .Marland Kitchen... 1/2 to 1 by mouth three times a day as needed muscle spasm  Orders: [facility] - Est  18-39 yrs ([id number])  Problem # 5:  CONTACT OR EXPOSURE TO OTHER VIRAL DISEASES (ICD-V01.79)  Orders: GC/Chlamydia-[facility] ([id number]) Wet Prep- [facility] (51884)  Problem # 6:  ROUTINE GYNECOLOGICAL EXAMINATION (ICD-V72.31)  Orders: [facility] - Est  18-39 yrs ([id number])  Problem # 7:  HYPERTENSION, BENIGN SYSTEMIC (ICD-401.1) Assessment: Deteriorated Restart medications. Her updated medication list for this problem includes:    Hydrochlorothiazide 25 Mg Tabs (Hydrochlorothiazide) .Marland Kitchen... Take 1 tablet by mouth every morning    Toprol Xl 50 Mg Tb24 (Metoprolol succinate) .Marland Kitchen... Take 1 tablet by mouth once a day  Orders: Comp Met-[facility] ([id number]) [facility] - Est  18-39 yrs ([id number])  Complete Medication List: 1)  Hydrochlorothiazide 25 Mg Tabs (Hydrochlorothiazide) .... Take 1 tablet by mouth every  morning 2)  Toprol Xl 50 Mg Tb24 (Metoprolol succinate) .... Take 1 tablet by mouth once a day 3)  Vicodin 5-500 Mg Tabs (Hydrocodone-acetaminophen) .Marland Kitchen.. 1-2 by mouth every 4-6 hours as needed for pain. 4)  Metrogel-vaginal 0.75 % Gel (Metronidazole) .... Insert one applicator into vagina at bedtime x 5 night 5)  Flexeril 10 Mg Tabs (Cyclobenzaprine hcl) .... 1/2 to 1 by mouth three times a day as needed muscle spasm 6)  Nystatin 100000 Unit/gm Crea (Nystatin) .... Apply to affected skin two times a day-tid  Other Orders: Pap Smear-[facility] ([id number]) Lipid-[facility] ([id number])  Patient Instructions: 1)  It was nice to see you today! 2)  I will send your prescriptions to your pharmacy. 3)  I will call if any of your labs are abnormal. Prescriptions: VICODIN 5-500 MG TABS (HYDROCODONE-ACETAMINOPHEN) 1-2 by mouth every 4-6 hours as needed for pain. Brand medically necessary #30 x 0    Entered and Authorized by:   Helane Rima MD   Signed by:   Helane Rima MD on 05/25/2009   Method used:   Print then Give to Patient   RxID:   [id number] TOPROL XL 50 MG TB24 (METOPROLOL SUCCINATE) Take 1 tablet by mouth once a day  #30 x 2   Entered and Authorized by:   Helane Rima MD   Signed by:   Helane Rima MD on 05/25/2009   Method used:   [facility]  [place] [address] [facility]       [address]       [place], [place]  [address]       Ph: [phone] or [phone]       Fax: [phone]   RxID:   [id number] HYDROCHLOROTHIAZIDE 25 MG TABS (HYDROCHLOROTHIAZIDE) Take 1 tablet by mouth every morning  #30 Tablet x 2   Entered and Authorized by:   Helane Rima MD   Signed by:   Helane Rima MD on 05/25/2009   Method used:   [facility]        [facility]  [place] [address]       [address]       [place], [place]  [address]       Ph: [phone] or [phone]       Fax: [phone]   RxID:   [id number] NYSTATIN 100000 UNIT/GM CREA (NYSTATIN) apply to affected skin two times a day-tid  #15 gm tube x 3   Entered and Authorized by:   Helane Rima MD   Signed by:   Helane Rima MD on 05/25/2009   Method used:   [facility]        [facility]  [place] [address] [facility]       [address]       [place], [place]  [address]       Ph: [phone] or [phone]       Fax: [phone]   RxID:   [id number] FLEXERIL 10 MG TABS (CYCLOBENZAPRINE HCL) 1/2 to 1 by mouth three times a day as needed muscle spasm  #30 x 0   Entered and Authorized by:   Helane Rima MD   Signed by:   Helane Rima MD on 05/25/2009   Method used:   Print then Give to Patient   RxID:   [id number]   Laboratory Results   Urine Tests  Date/Time Received: May 25, 2009 10:40 AM  Date/Time Reported:  May 25, 2009 11:00 AM   Routine Urinalysis   Color: yellow Appearance: Clear Glucose: negative   (Normal Range: Negative) Bilirubin:  negative   (Normal Range: Negative) Ketone: negative   (Normal Range: Negative) Spec. Gravity: 1.020   (Normal Range: 1.003-1.035) Blood: small   (Normal Range: Negative) pH: 6.5   (Normal Range: 5.0-8.0) Protein: trace   (Normal Range: Negative) Urobilinogen: 1.0   (Normal Range: 0-1) Nitrite: negative   (Normal Range: Negative) Leukocyte Esterace: trace   (Normal Range: Negative)  Urine Microscopic WBC/HPF: 0-3 RBC/HPF: 1-5 Bacteria/HPF: 3+ Mucous/HPF: trace Epithelial/HPF: 1-5    Urine HCG: negative Comments: ...........test performed by...........Marland KitchenTerese Door, CMA  Date/Time Received: May 25, 2009 10:40 AM  Date/Time Reported: May 25, 2009 11:05 AM   [place] Source: vaginal WBC/hpf: 1-5 Bacteria/hpf: 3+  Rods Clue cells/hpf: none  Negative whiff Yeast/hpf: none Trichomonas/hpf: none Comments: ...........test performed by...........Marland KitchenTerese Door, CMA     Prevention & Chronic Care Immunizations   Influenza vaccine: Not documented    Tetanus booster: 09/09/2003: Done.   Tetanus booster due: 09/08/2013    Pneumococcal vaccine: Not documented  Other Screening   Pap smear: Done.  (06/08/2006)   Pap smear due: 06/09/2007   Smoking status: never  (05/25/2009)  Lipids   Total Cholesterol: 161  (12/29/2007)   LDL: 100  (12/29/2007)   LDL Direct: Not documented   HDL: 38  (12/29/2007)   Triglycerides: 116  (12/29/2007)  Hypertension   Last Blood Pressure: 143 / 89  (05/25/2009)   Serum creatinine: 0.91  (01/26/2008)   Serum potassium 4.0  (01/26/2008) CMP ordered     Hypertension flowsheet reviewed?: Yes   Progress toward BP goal: Unchanged  Self-Management Support :    Patient will work on the following items until the next clinic visit to reach self-care goals:     Medications and monitoring: take my medicines every day, check my blood pressure, bring all of my medications to every visit  (05/25/2009)     Eating: drink diet soda or  water instead of juice or soda, eat more vegetables, use fresh or frozen vegetables, eat foods that are low in salt, eat baked foods instead of fried foods, eat fruit for snacks and desserts  (05/25/2009)     Activity: take a 30 minute walk every day, take the stairs instead of the elevator, park at the far end of the parking lot  (05/25/2009)    Hypertension self-management support: Written self-care plan  (05/25/2009)   Hypertension self-care plan printed.
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
## 2010-10-10 NOTE — Progress Notes (Signed)
Summary: meds prob  Phone Note Call from Patient Call back at [phone]   Caller: Patient Summary of Call: pt wants the pill instead of cream [facility] [place] [facility]  Initial call taken by: De Nurse,  May 25, 2009 11:55 AM  Follow-up for Phone Call        to pcp Follow-up by: Golden Circle RN,  May 25, 2009 11:59 AM  Additional Follow-up for Phone Call Additional follow up Details #1::        pt has been waiting and needs to be able to pick up meds ASAP, pls call to let her know that it has been called in. Additional Follow-up by: De Nurse,  May 25, 2009 1:39 PM    Additional Follow-up for Phone Call Additional follow up Details #2::    states drug store did not rx for flexeril either. wants this done asap along with change of metronidazole Follow-up by: Golden Circle RN,  May 25, 2009 1:57 PM  Prescriptions: FLEXERIL 10 MG TABS (CYCLOBENZAPRINE HCL) 1/2 to 1 by mouth three times a day as needed muscle spasm  #30 x 0   Entered and Authorized by:   Helane Rima MD   Signed by:   Helane Rima MD on 05/26/2009   Method used:   [facility]  [place] [address]       [address]       [place], [place]  [address]       Ph: [phone] or [phone]       Fax: [phone]   RxID:   [id number]  I did not fill Metronidazole. I prescribed Nystatin. This must be topical for her condition.

## 2010-10-10 NOTE — Assessment & Plan Note (Signed)
Summary: DEPO/[facility]  Nurse Visit   Allergies: No Known Drug Allergies  Medication Administration  Injection # 1:    Medication: Depo-Provera 150mg     Diagnosis: CONTRACEPTIVE MANAGEMENT (ICD-V25.09)    Route: IM    Site: R deltoid    Exp Date: 12/2011    Lot #: [id number]    Mfr: [email or web address]    Comments: next depo due  Feb 22 thru November 13, 2009    Patient tolerated injection without complications    Given by: Theresia Lo RN (August 14, 2009 10:55 AM)  Orders Added: 1)  Depo-Provera 150mg  [J1055] 2)  Admin of Injection (IM/SQ) [96372] 3)  RPR-[facility] [phone] 4)  HIV-[facility] [[id number]]   Medication Administration  Injection # 1:    Medication: Depo-Provera 150mg     Diagnosis: CONTRACEPTIVE MANAGEMENT (ICD-V25.09)    Route: IM    Site: R deltoid    Exp Date: 12/2011    Lot #: [id number]    Mfr: [email or web address]    Comments: next depo due  Feb 22 thru November 13, 2009    Patient tolerated injection without complications    Given by: Theresia Lo RN (August 14, 2009 10:55 AM)  Orders Added: 1)  Depo-Provera 150mg  [J1055] 2)  Admin of Injection (IM/SQ) [95284] 3)  RPR-[facility] [phone] 4)  HIV-[facility] [[id number]]  Patient requested copy of her recent labs and this is printed and given to patient. she  thought she was to have HIV and RPR done with recent labs. she is still interested in having this done. will come back after the first of the year for this. will ask Dr. Earlene Plater to please put in order for future lab. Theresia Lo RN  August 14, 2009 10:58 AM   Future Order added. Please encourage the patient to make the next appointment an office visit for follow-up of her Galactorrhea and HTN. Helane Rima DO  August 14, 2009 6:34 PM

## 2010-10-10 NOTE — Letter (Signed)
Summary: Results Letter  [facility]  [address]   [place], [place] [address]   Phone: [phone]  Fax: [phone]    02/10/2010  Summer Brewer [address] [place], [place]  [address]  Dear [name],  I am happy to report that your recent labs were all normal.   Sincerely,   Helane Rima DO  Appended Document: Results Letter mailed letter to pt

## 2010-10-10 NOTE — Progress Notes (Signed)
Summary: UPDATE PROBLEM LIST   
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
## 2010-10-10 NOTE — Assessment & Plan Note (Signed)
Summary: depo,df  Nurse Visit        Medication Administration  Injection # 1:    Medication: Depo-Provera 150mg     Diagnosis: CONTRACEPTIVE MANAGEMENT (ICD-V25.09)    Route: IM    Site: L deltoid    Exp Date: 02/2011    Lot #: [id number]    Mfr: Francisca December    Comments: Pt advised to return to clinic for next depo 05/17/09 - 05/31/09.    Patient tolerated injection without complications    Given by: Jacki Cones RN (March 01, 2009 3:11 PM)  Orders Added: 1)  Depo-Provera 150mg  [J1055] 2)  Est Level 1- [facility] [[id number]]      Medication Administration  Injection # 1:    Medication: Depo-Provera 150mg     Diagnosis: CONTRACEPTIVE MANAGEMENT (ICD-V25.09)    Route: IM    Site: L deltoid    Exp Date: 02/2011    Lot #: [id number]    Mfr: Francisca December    Comments: Pt advised to return to clinic for next depo 05/17/09 - 05/31/09.    Patient tolerated injection without complications    Given by: Jacki Cones RN (March 01, 2009 3:11 PM)  Orders Added: 1)  Depo-Provera 150mg  [J1055] 2)  Est Level 1- [facility] [[id number]]

## 2010-10-10 NOTE — Letter (Signed)
Summary: Results Letter  [facility]  [address]   [place], [place] [address]   Phone: [phone]  Fax: [phone]    06/18/2009  Summer Brewer [address] [place], [place]  [address]  Dear [name],  I am happy to inform you that the results of your recent labs were all normal.  If you are still having nipple discharge, please make an appointment so that we can discuss options for further work-up of this issue.  If you have any questions or concerns, please don't hesitate to call.  Sincerely,   Helane Rima DO   Appended Document: Results Letter mailed.

## 2010-10-10 NOTE — Progress Notes (Signed)
Summary: FYI/TS  Phone Note Call from Patient   Summary of Call: Could not come today due to having to take mom to the emergency room this a.m. Initial call taken by: Clydell Hakim,  Feb 05, 2010 10:53 AM

## 2010-10-10 NOTE — Miscellaneous (Signed)
Summary: injured finger  Clinical Lists Changes states about 30 minutes ago, she got her left "pointer" finger caught in a part of her car for a few minutes. small cut but badly swollen & hard to move the finger. she did ice it right away. on her way here now to be seen in work in.[place] RN  November 28, 2009 1:38 PM

## 2010-10-10 NOTE — Assessment & Plan Note (Signed)
Summary: injured finger l hand/[place]   Vital Signs:  Patient profile:   40 year old female Height:      66.5 inches Weight:      237 pounds BMI:     37.82 Temp:     98.2 degrees F oral Pulse rate:   98 / minute BP sitting:   150 / 104  (right arm) Cuff size:   large  Vitals Entered By: Tessie Fass CMA (November 28, 2009 2:42 PM) CC: injured finger this afternoon Is Patient Diabetic? No Pain Assessment Patient in pain? yes     Location: finger Intensity: 10   Primary Care Provider:  Helane Rima DO  CC:  injured finger this afternoon.  History of Present Illness:   40 y/o F here with finger pain and swelling.  Pt was in [employer] parking lot when she smashed her finger in the hand-break lever in her car.  She was not able to pull her finger out.  Bystanders helped her to pull her finger out.  Finger swelled right away.  Pt now has throbbing pain.  She is not able to move it  Menstrual cycle management: depo.  missed last schedule dose between 2/28 and 3/8 because her child was sick.  Pt has BTL.    Habits & Providers  Alcohol-Tobacco-Diet     Tobacco Status: never  Current Medications (verified): 1)  Hydrochlorothiazide 25 Mg Tabs (Hydrochlorothiazide) .... Take 1 Tablet By Mouth Every Morning 2)  Toprol Xl 50 Mg Tb24 (Metoprolol Succinate) .... Take 1 Tablet By Mouth Once A Day 3)  Metrogel-Vaginal 0.75 % Gel (Metronidazole) .... Insert One Applicator Into Vagina At Bedtime X 5 Night 4)  Flexeril 10 Mg Tabs (Cyclobenzaprine Hcl) .... 1/2 To 1 By Mouth Three Times A Day As Needed Muscle Spasm 5)  Nystatin 100000 Unit/gm Crea (Nystatin) .... Apply To Affected Skin Two Times A Day-Tid 6)  Diclofenac Sodium 75 Mg Tbec (Diclofenac Sodium) .... 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  Sexually active.  Past Medical History: Reviewed history from 05/25/2009 and no changes required. On Depo for menorrhagia Paronychia L great toe 2/07 Recurrent UTIs Seasonal Allergies HTN  Past Surgical History: Reviewed history from 05/25/2009 and no changes required. 07/03/06 ldl: 12 hdl: 43 tri: 104 BTL5-02  C-section x2 for preeclampsia Emergency c-section 4-00 at 38 wks L foot xray neg for fx, + heel spur - 07/31/2003  Physical Exam  General:  Well-developed,well-nourished,in no acute distress; alert,appropriate and cooperative throughout examination. vitals reviewed Head:  Normocephalic and atraumatic without obvious abnormalities. No apparent alopecia or balding. Mouth:  Oral mucosa and oropharynx without lesions or exudates.  Teeth in good repair. Abdomen:  Bowel sounds positive,abdomen soft and non-tender without masses, organomegaly or hernias noted. Msk:  Left hand: Index finger is swollen, pt not able to flex finger.  She is able to oppose finger with tenderness.  Strength is intact.  No ecchymoses. Wrist and other fingers with full rom and strength.   Pulses:  R and L radial, pulses are full and equal bilaterally Extremities:  No clubbing, cyanosis, edema, or deformity noted with normal full range of motion of all joints.   Neurologic:  alert & oriented X3 and cranial nerves II-XII intact.   Skin:  turgor normal, color normal, and no ecchymoses.     Impression & Recommendations:  Problem # 1:  INJURY, FINGER (ICD-959.5) Assessment New Cannot fully asses rom due to swelling and pain.  Pt does not want to move finger.  I do not think there is a fracture.  Swelling has improved already from when this occurred this morning.  Will get xray to be sure.  Advised pt to continue icing it.  She can take ibuprofen for pain.    Orders: Radiology other (Radiology Other) [facility]- Est  Level 4 ([address])  Problem # 2:  HYPERTENSION, BENIGN SYSTEMIC (ICD-401.1) Assessment: Comment Only  BP elevated today.  May be secondary to pain, but there has been elevation in the past.  Advised pt to schedule appt with PCP to address this.  Her updated medication list for this problem includes:    Hydrochlorothiazide 25 Mg Tabs (Hydrochlorothiazide) .Marland Kitchen... Take 1 tablet by mouth every morning    Toprol Xl 50 Mg Tb24 (Metoprolol succinate) .Marland Kitchen... Take 1 tablet by mouth once a day  Orders: [facility]- Est  Level 4 ([address])  Problem # 3:  CONTRACEPTIVE MANAGEMENT (ICD-V25.09) Assessment: Unchanged Pt had BTL in 01/2001.  Missed Depo appt because her child was sick.  Will give Depo today without doing UPT first.   Orders: Depo-Provera 150mg  (J1055) [facility]- Est  Level 4 ([address])  Complete Medication List: 1)  Hydrochlorothiazide 25 Mg Tabs (Hydrochlorothiazide) .... Take 1 tablet by mouth every morning 2)  Toprol Xl 50 Mg Tb24 (Metoprolol succinate) .... Take 1 tablet by mouth once a day 3)  Metrogel-vaginal 0.75 % Gel (Metronidazole) .... Insert one applicator into vagina at bedtime x 5 night 4)  Flexeril 10 Mg Tabs (Cyclobenzaprine hcl) .... 1/2 to 1 by mouth three times a day as needed muscle spasm 5)  Nystatin 100000 Unit/gm Crea (Nystatin) .... Apply to affected skin two times a day-tid 6)  Diclofenac Sodium 75 Mg Tbec (Diclofenac sodium) .... Sig: take 1 tab by mouth two times a day as directed  Patient Instructions: 1)  Please schedule an appointment with your primary doctor as soon as possible for blood pressure. 2)  Your blood pressure was high today.  I would like you to see Dr Earlene Plater for this. 3)  For your finger, I will get xray today.  I will call you [phone] with the result.  In the meantime, ice for 20-30 minutes every 3-4 hrs.   4)  We have given you Depo shot today.  Please remember to come in for next injection when it is  due.   Medication Administration  Injection # 1:    Medication: Depo-Provera 150mg     Diagnosis: CONTRACEPTIVE MANAGEMENT (ICD-V25.09)    Route: IM    Site: L deltoid    Exp Date: 01/07/2012    Lot #: [id number]    Mfr: Francisca December    Comments: Pt instructed to RTC b/t 6/8 and 6/22 for next injection.    Patient tolerated injection without complications    Given by: Dennison Nancy RN (November 28, 2009 3:16 PM)  Orders Added: 1)  Radiology other [Radiology Other] 2)  Depo-Provera 150mg  [J1055] 3)  [facility]- Est  Level 4 [32440]
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
## 2010-10-10 NOTE — Assessment & Plan Note (Signed)
Summary: bp problems,tcb   Vital Signs:  Patient profile:   40 year old female Height:      66.5 inches Weight:      234 pounds BMI:     37.34 Temp:     98.2 degrees F oral Pulse rate:   88 / minute BP sitting:   134 / 92  (right arm) Cuff size:   large  Vitals Entered By: Tessie Fass CMA (February 07, 2010 10:22 AM) CC: BP check Is Patient Diabetic? No Pain Assessment Patient in pain? no        Primary Care Provider:  Kewan Mcnease DO  CC:  BP check.  History of Present Illness: 40 year old:  1. HTN: Rx Toprol, HCTZ. Denies CP, SOB, N/V/D, LE edema.  2. Headaches: Daily to every other day, generalized but > left side, throbbing, soemtimes with blurry vision, bright lights in vision before headache, + photophobia, and + phonophobia. Had eyes checked on 4/11 - "fine." No focal neurological deficits, double vision, dizziness, falls, eye tearing, or head trauma.  Habits & Providers  Alcohol-Tobacco-Diet     Tobacco Status: never  Current Medications (verified): 1)  Hydrochlorothiazide 25 Mg Tabs (Hydrochlorothiazide) .... Take 1 Tablet By Mouth Every Morning 2)  Toprol Xl 50 Mg Tb24 (Metoprolol Succinate) .... Take 1 Tablet By Mouth Once A Day 3)  Topamax 50 Mg Tabs (Topiramate) .... 1/2 By Mouth Daily For One Week, Then 1 By Mouth Daily For One Week, Then 2 By Mouth Daily 4)  Imitrex 50 Mg Tabs (Sumatriptan Succinate) .... One By Mouth At The First Sign of A Migraine, May Take One More 2 Hours Later If Ha Persists  Allergies (verified): No Known Drug Allergies PMH-FH-SH reviewed for relevance  Review of Systems      See HPI  Physical Exam  General:  Well-developed,well-nourished,in no acute distress; alert,appropriate and cooperative throughout examination. Vitals reviewed Head:  Normocephalic and atraumatic without obvious abnormalities.  Eyes:  No corneal or conjunctival inflammation noted. EOMI. Perrla. Vision grossly normal. Ears:  R ear normal and L ear  normal.   Nose:  External nasal examination shows no deformity or inflammation. Nasal mucosa are pink and moist without lesions or exudates. Mouth:  Oral mucosa and oropharynx without lesions or exudates.  Teeth in good repair. Neck:  No deformities, masses, or tenderness noted. Lungs:  Normal respiratory effort, chest expands symmetrically. Lungs are clear to auscultation, no crackles or wheezes. Heart:  Normal rate and regular rhythm. S1 and S2 normal without gallop, murmur, click, rub or other extra sounds. Pulses:  R and L carotid, radial dorsalis pedis, and posterior tibial pulses are full and equal bilaterally. Extremities:  No clubbing, cyanosis, edema, or deformity noted with normal full range of motion of all joints.   Neurologic:  Aert & oriented X3, cranial nerves II-XII intact, strength normal in all extremities, sensation intact to light touch, gait normal, and DTRs symmetrical and normal.   Skin:  Intact without suspicious lesions or rashes. Psych:  Oriented X3, memory intact for recent and remote, normally interactive, good eye contact, not anxious appearing, and not depressed appearing.     Impression & Recommendations:  Problem # 1:  HYPERTENSION, BENIGN SYSTEMIC (ICD-401.1) Assessment Unchanged  Her updated medication list for this problem includes:    Hydrochlorothiazide 25 Mg Tabs (Hydrochlorothiazide) .Marland Kitchen... Take 1 tablet by mouth every morning    Toprol Xl 50 Mg Tb24 (Metoprolol succinate) .Marland Kitchen... 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 Discussed importance of monitoring blood pressure when taking Imitrex. Advised patient not to take Imitrex if BP > 140/90. NOTE: Noted previous visit for galactorrhea while completing chart. Will  discuss with preceptor and call patient for possible head imaging to R/O mass. The following medications were removed from the medication list:    Diclofenac Sodium 75 Mg Tbec (Diclofenac sodium) ..... Sig: take 1 tab by mouth two times a day as directed Her updated medication list for this problem includes:    Toprol Xl 50 Mg Tb24 (Metoprolol succinate) .Marland Kitchen... Take 1 tablet by mouth once a day    Imitrex 50 Mg Tabs (Sumatriptan succinate) ..... One by mouth at the first sign of a migraine, may take one more 2 hours later if ha persists  Orders: [facility]- Est  Level 4 ([address])  Problem # 3:  OBESITY, NOS (ICD-278.00) Assessment: Unchanged Encourage healthy diet and exercise. Orders: Comp Met-[facility] [id number]) Lipid-[facility] [phone] [id number]) TSH-[facility] [id number]) [facility]- Est  Level 4 ([address])  Complete Medication List: 1)  Hydrochlorothiazide 25 Mg Tabs (Hydrochlorothiazide) .... Take 1 tablet by mouth every morning 2)  Toprol Xl 50 Mg Tb24 (Metoprolol succinate) .... Take 1 tablet by mouth once a day 3)  Topamax 50 Mg Tabs (Topiramate) .... 1/2 by mouth daily for one week, then 1 by mouth daily for one week, then 2 by mouth daily 4)  Imitrex 50 Mg Tabs (Sumatriptan succinate) .... One by mouth at the first sign of a migraine, may take one more 2 hours later if ha persists  Patient Instructions: 1)  It was nice to see you today! 2)  I am prescriing Topamax for you to take every day for prevention of migraines and Imitrex for when you really need to stop a migraine. 3)  Keep taking your HCTZ and Toprol. 4)  We will check labs today. 5)  Call in 2-4 weeks to let me know if the medication is working. 6)  Follow up in 2-3 months. Prescriptions: TOPROL XL 50 MG TB24 (METOPROLOL SUCCINATE) Take 1 tablet by mouth once a day  #90 x 3   Entered and Authorized by:   Helane Rima DO   Signed by:   Helane Rima DO on 02/07/2010   Method used:   [facility]  [place] [address] [facility]        [address]       [place], [place]  [address]       Ph: [phone] or [phone]       Fax: [phone]   RxID:   [id number] HYDROCHLOROTHIAZIDE 25 MG TABS (HYDROCHLOROTHIAZIDE) Take 1 tablet by mouth every morning  #90 x 3   Entered and Authorized by:   Helane Rima DO   Signed by:   Helane Rima DO on 02/07/2010   Method used:   [facility]        [facility]  [place] [address]       [address]       [place], [place]  [address]       Ph: [phone] or [phone]       Fax: [phone]   RxID:   [id number] IMITREX 50 MG TABS (SUMATRIPTAN SUCCINATE)  one by mouth at the first sign of a migraine, may take one more 2 hours later if HA persists  #4 x 0   Entered and Authorized by:   Helane Rima DO   Signed by:   Helane Rima DO on 02/07/2010   Method used:   [facility]        [facility]  [place] [address]       [address]       [place], [place]  [address]       Ph: [phone] or [phone]       Fax: [phone]   RxID:   [id number] TOPAMAX 50 MG TABS (TOPIRAMATE) 1/2 by mouth daily for one week, then 1 by mouth daily for one week, then 2 by mouth daily  #60 x 3   Entered and Authorized by:   Helane Rima DO   Signed by:   Helane Rima DO on 02/07/2010   Method used:   [facility]        [facility]  [place] [address]       [address]       [place], [place]  [address]       Ph: [phone] or [phone]       Fax: [phone]   RxID:   [id number]   Prevention & Chronic Care Immunizations   Influenza vaccine: Not documented   Influenza vaccine deferral: Not indicated  (02/07/2010)    Tetanus booster: 09/09/2003: Done.   Tetanus booster due: 09/08/2013    Pneumococcal vaccine: Not documented  Other Screening   Pap smear: NEGATIVE FOR INTRAEPITHELIAL LESIONS OR MALIGNANCY.  (05/25/2009)   Pap smear due: 06/09/2007   Smoking status: never  (02/07/2010)  Lipids   Total Cholesterol: 172  (05/25/2009)   LDL:  103  (05/25/2009)   LDL Direct: Not documented   HDL: 38  (05/25/2009)   Triglycerides: 155  (05/25/2009)  Hypertension   Last Blood Pressure: 134 / 92  (02/07/2010)   Serum creatinine: 0.93  (05/25/2009)   Serum potassium 4.2  (05/25/2009) CMP ordered     Hypertension flowsheet reviewed?: Yes   Progress toward BP goal: At goal  Self-Management Support :   Personal Goals (by the next clinic visit) :      Personal blood pressure goal: 140/90  (02/07/2010)   Patient will work on the following items until the next clinic visit to reach self-care goals:     Medications and monitoring: take my medicines every day, bring all of my medications to every visit  (02/07/2010)     Eating: drink diet soda or water instead of juice or soda, eat more vegetables, use fresh or frozen vegetables, eat foods that are low in salt, eat baked foods instead of fried foods, eat fruit for snacks and desserts, limit or avoid alcohol  (02/07/2010)     Activity: take a 30 minute walk every day, take the stairs instead of the elevator, park at the far end of the parking lot  (02/07/2010)    Hypertension self-management support: Written self-care plan  (02/07/2010)   Hypertension self-care plan printed.  Appended Document: bp problems,tcb Discussed case with preceptor, Dr. Sheffield Slider. Patient with no more galactorrhea, no peripheral vision loss, normal prolactin. Will hold on imaging and continue to monitor closely at this time.

## 2010-10-10 NOTE — Letter (Signed)
Summary: Generic Letter  [facility]  [address]   [place], [place] [address]   Phone: [phone]  Fax: [phone]    05/22/2010  Summer Brewer [address] [place], [place]  [address]  Dear Ms. Knupp,  I am happy to inform you that all of your labs are negative.  Sincerely,   Helane Rima DO  Appended Document: Generic Letter mailed

## 2010-11-18 ENCOUNTER — Ambulatory Visit: Payer: Self-pay | Admitting: Family Medicine

## 2010-11-19 ENCOUNTER — Ambulatory Visit (INDEPENDENT_AMBULATORY_CARE_PROVIDER_SITE_OTHER): Payer: Medicaid Other | Admitting: Family Medicine

## 2010-11-19 ENCOUNTER — Encounter: Payer: Self-pay | Admitting: Family Medicine

## 2010-11-19 VITALS — BP 150/98 | HR 78 | Wt 245.5 lb

## 2010-11-19 DIAGNOSIS — M545 Low back pain, unspecified: Secondary | ICD-10-CM

## 2010-11-19 DIAGNOSIS — N920 Excessive and frequent menstruation with regular cycle: Secondary | ICD-10-CM | POA: Insufficient documentation

## 2010-11-19 DIAGNOSIS — I10 Essential (primary) hypertension: Secondary | ICD-10-CM

## 2010-11-19 LAB — CONVERTED CEMR LAB
HCT: 34.3 % — ABNORMAL LOW (ref 36.0–46.0)
Hemoglobin: 10.9 g/dL — ABNORMAL LOW (ref 12.0–15.0)
RBC: 4.06 M/uL (ref 3.87–5.11)
RDW: 14.1 % (ref 11.5–15.5)

## 2010-11-19 LAB — CBC
MCV: 84.5 fL (ref 78.0–100.0)
Platelets: 170 10*3/uL (ref 150–400)
RBC: 4.06 MIL/uL (ref 3.87–5.11)
WBC: 4.8 10*3/uL (ref 4.0–10.5)

## 2010-11-19 MED ORDER — HYDROCODONE-ACETAMINOPHEN 5-500 MG PO TABS: 1.0000 | ORAL_TABLET | ORAL | Status: DC | PRN

## 2010-11-19 MED ORDER — MEDROXYPROGESTERONE ACETATE 150 MG/ML IM SUSP
150.0000 mg | Freq: Once | INTRAMUSCULAR | Status: AC
  Administered 2010-11-19: 150 mg via INTRAMUSCULAR

## 2010-11-19 MED ORDER — CYCLOBENZAPRINE HCL 5 MG PO TABS: 10.0000 mg | ORAL_TABLET | Freq: Three times a day (TID) | ORAL | Status: DC | PRN

## 2010-11-19 MED ORDER — LISINOPRIL-HYDROCHLOROTHIAZIDE 20-12.5 MG PO TABS: 1.0000 | ORAL_TABLET | Freq: Every day | ORAL | Status: DC

## 2010-11-19 NOTE — Patient Instructions (Addendum)
Return in 2 weeks for BMET and BP check    Pain meds should only be used when you are in severe pain.    Back Exercises Back exercises help treat and prevent back injuries. The goal of back exercises is to increase the strength of your abdominal and back muscles and the flexibility of your back. These exercises should be started when you no longer have back pain. Back exercises include: Pelvic Tilt - Lie on your back with your knees bent. Tilt your pelvis until the lower part of your back is against the floor. Hold this position 5-10 sec and repeat 5-10 times.  Knee to Chest - Pull first one knee up against your chest and hold for 20-30 seconds, repeat this with the other knee, and then both knees. This may be done with the other leg straight or bent, whichever feels better.  Sit-Ups or Curl-Ups - Bend your knees 90 degrees. Start with tilting your pelvis, and do a partial, slow sit-up, lifting your trunk only 30-45 degrees off the floor. Take at least 2-3 sec for each sit-up. Do not do sit-ups with your knees out straight. If partial sit-ups are difficult, simply do the above but with only tightening your abdominal muscles and holding it as directed.  Hip-Lift - Lie on your back with your knees flexed 90 degrees. Push down with your feet and shoulders as you raise your hips a couple inches off the floor; hold for 10 sec, repeat 5-10 times.  Back arches - Lie on your stomach, propping yourself up on bent elbows. Slowly press on your hands, causing an arch in your low back. Repeat 3-5 times. Any initial stiffness and discomfort should lessen with repetition over time.  Shoulder-Lifts - Lie face down with arms beside your body. Keep hips and torso pressed to floor as you slowly lift your head and shoulders off the floor.  Do not overdo your exercises, especially in the beginning. Exercises may cause you some mild back discomfort which lasts for a few minutes; however, if the pain is more severe, or  lasts for more than 15 minutes, do not continue exercises until you see your caregiver. Improvement with exercise therapy for back problems is slow.  See your caregivers for assistance with developing a proper back exercise program. Document Released: 10/02/2004 Document Re-Released: 11/21/2008 [facility] Patient Information 2011 [place], [place].Back Pain (Lumbosacral Strain) Back pain is one of the most common causes of pain. There are many causes of back pain. Most are not serious conditions.  CAUSES Your backbone (spinal column) is made up of 24 main vertebral bodies, the sacrum, and the coccyx. These are held together by muscles and tough, fibrous tissue (ligaments). Nerve roots pass through the openings between the vertebrae. A sudden move or injury to the back may cause injury to, or pressure on, these nerves. This may result in localized back pain or pain movement (radiation) into the buttocks, down the leg, and into the foot. Sharp, shooting pain from the buttock down the back of the leg (sciatica) is frequently associated with a ruptured (herniated) disc. Pain may be caused by muscle spasm alone. Your caregiver can often find the cause of your pain by the details of your symptoms and an exam. In some cases, you may need tests (such as X-rays). Your caregiver will work with you to decide if any tests are needed based on your specific exam. HOME CARE INSTRUCTIONS  Avoid an underactive lifestyle. Active exercise, as directed by your  caregiver, is your greatest weapon against back pain.   Avoid hard physical activities (tennis, racquetball, water-skiing) if you are not in proper physical condition for it. This may aggravate and/or create problems.   If you have a back problem, avoid sports requiring sudden body movements. Swimming and walking are generally safer activities.   Maintain good posture.   Avoid becoming overweight (obese).   Use bed rest for only the most extreme, sudden (acute)  episode. Your caregiver will help you determine how much bed rest is necessary.   For acute conditions, you may put ice on the injured area.   Put ice in a plastic bag.   Place a towel between your skin and the bag.   Leave the ice on for 15 minutes at a time, every 2 hours, or as needed.   After you are improved and more active, it may help to apply heat for 30 minutes before activities.  See your caregiver if you are having pain that lasts longer than expected. Your caregiver can advise appropriate exercises and/or therapy if needed. With conditioning, most back problems can be avoided. SEEK IMMEDIATE MEDICAL CARE IF:  You have numbness, tingling, weakness, or problems with the use of your arms or legs.   You experience severe back pain not relieved with medicines.   There is a change in bowel or bladder control.   You have increasing pain in any area of the body, including your belly (abdomen).   You notice shortness of breath, dizziness, or feel faint.   You feel sick to your stomach (nauseous), are throwing up (vomiting), or become sweaty.   You notice discoloration of your toes or legs, or your feet get very cold.   Your back pain is getting worse.   You have an oral temperature above 102, not controlled by medicine.  MAKE SURE YOU:   Understand these instructions.   Will watch your condition.   Will get help right away if you are not doing well or get worse.  Document Released: 06/04/2005 Document Re-Released: 11/19/2009 [facility] Patient Information 2011 [place], [place].

## 2010-11-19 NOTE — Assessment & Plan Note (Signed)
Change to ACE/thiazide combination; return in 2 weeks for labs to check renal function and K. BP check at that time.
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
## 2010-11-19 NOTE — Assessment & Plan Note (Signed)
Resume Depo-provera to control bleeding

## 2010-11-19 NOTE — Assessment & Plan Note (Signed)
Recommended PT but she does not feel that she has transportation, I did give her back exercises, and refilled her hydrocodone and cyclobenzaprine

## 2010-11-20 ENCOUNTER — Encounter: Payer: Self-pay | Admitting: Family Medicine

## 2010-12-03 ENCOUNTER — Other Ambulatory Visit: Payer: Medicaid Other

## 2011-01-07 ENCOUNTER — Ambulatory Visit (INDEPENDENT_AMBULATORY_CARE_PROVIDER_SITE_OTHER): Payer: Medicaid Other | Admitting: Family Medicine

## 2011-01-07 DIAGNOSIS — M545 Low back pain, unspecified: Secondary | ICD-10-CM

## 2011-01-07 DIAGNOSIS — L309 Dermatitis, unspecified: Secondary | ICD-10-CM | POA: Insufficient documentation

## 2011-01-07 DIAGNOSIS — L259 Unspecified contact dermatitis, unspecified cause: Secondary | ICD-10-CM | POA: Insufficient documentation

## 2011-01-07 DIAGNOSIS — K219 Gastro-esophageal reflux disease without esophagitis: Secondary | ICD-10-CM

## 2011-01-07 MED ORDER — OMEPRAZOLE 20 MG PO CPDR: 20.0000 mg | DELAYED_RELEASE_CAPSULE | Freq: Two times a day (BID) | ORAL | Status: DC

## 2011-01-07 MED ORDER — TRIAMCINOLONE ACETONIDE 0.5 % EX OINT: TOPICAL_OINTMENT | Freq: Two times a day (BID) | CUTANEOUS | Status: DC

## 2011-01-07 MED ORDER — DICLOFENAC SODIUM 75 MG PO TBEC: 75.0000 mg | DELAYED_RELEASE_TABLET | Freq: Two times a day (BID) | ORAL | Status: DC

## 2011-01-07 NOTE — Patient Instructions (Signed)
Globus Syndrome (Globus Hystericus) Globus Syndrome is a feeling of a lump or a sensation of something caught in your throat. Eating food or drinking fluids does not seem to get rid of it. Yet it is not noticeable during the actual act of swallowing food or liquids. Usually there is nothing physically wrong. It is troublesome because it is an unpleasant sensation which is sometimes difficult to ignore and at times may seem to worsen. The syndrome is quite common. It is estimated 45% of the population experiences features of the condition at some stage during their lives. The symptoms are usually temporary. The largest group of people who feel the need to seek medical treatment is females between the ages of 45 to 2.  CAUSES Globus Syndrome appears to be triggered by or aggravated by stress, anxiety and depression.  Tension related to stress could product abnormal muscle spasms in the esophagus which would account for the sensation of a lump or ball in your throat.   Frequent swallowing or drying of the throat caused by anxiety or other strong emotions can also produce this uncomfortable sensation in your throat.   Fear and sadness can be expressed by the body in many ways. For instance, if you had a relative with throat cancer you might become overly concerned about your own health and develop uncomfortable sensations in your throat.   The reaction to a crisis or a trauma event in your life can take the form of a lump in your throat. It is as if you are indirectly saying you can not handle or "swallow" one more thing.  DIAGNOSIS Usually your caregiver will know what is wrong by talking to you and examining you. If the condition persists for several days, more testing may be done to make sure there is not another problem present. This is usually not the case. TREATMENT  Reassurance is often the best treatment available. Usually the problem leaves without treatment over several days.   Sometimes  anti-anxiety medications may be prescribed.   Counseling or talk therapy can also help with strong underlying emotions.   Note that in most cases this is not something that keeps coming back and you should not be concerned or worried.  Document Released: 11/15/2003 Document Re-Released: 12/02/2007 [facility] Patient Information 2011 [place], [place].

## 2011-01-09 ENCOUNTER — Encounter: Payer: Self-pay | Admitting: Family Medicine

## 2011-01-09 NOTE — Assessment & Plan Note (Signed)
Discussed lifestyle modification. Rx Prilosec. Follow up in 2 weeks if not improving.

## 2011-01-09 NOTE — Assessment & Plan Note (Signed)
Rx Triamcinolone  
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
## 2011-01-09 NOTE — Assessment & Plan Note (Signed)
Patient tried her father's Voltaren and states that it worked "great." Will DC Vicodin and Rx Voltaren.

## 2011-01-24 NOTE — Discharge Summary (Signed)
[facility]  Patient:    Summer Brewer, Summer Brewer                     MRN: [id number] Adm. Date:  [id number] Disc. Date: [id number] Attending:  Donne Hazel Dictator:   Danie Chandler, R.N.                           Discharge Summary  ADMISSION DIAGNOSES:          1. Intrauterine pregnancy at term.                               2. Repeat cesarean section.                               3. Permanent low anterior sterilization.  DISCHARGE DIAGNOSES:          1. Intrauterine pregnancy at term.                               2. Repeat cesarean section.                               3. Permanent low anterior sterilization.  PROCEDURES:                   On Feb 05, 2001, repeat low transverse cesarean section and bilateral tubal ligation.  REASON FOR ADMISSION:         Please see H&P.  HOSPITAL COURSE:              The patient was taken to the operating room and underwent the above-named procedure without complications.  This was productive of a viable female infant with Apgars of 9 at one minute and 9 at five minutes.  Postoperatively on day #1 the patients hemoglobin was 9.7, hematocrit 29.5, and white blood cell count 8.4.  On postoperative day #2 the patient was ambulating well without difficulty and had good pain control.  She had a good return of bowel function and was tolerating a regular diet.  She was discharged home on postoperative day #3.  CONDITION ON DISCHARGE:       Good.  DIET:                         Regular as tolerated.  ACTIVITY:                     No heavy lifting, no driving, no vaginal entry.  FOLLOW-UP:                    She is to follow up in the office in one to two weeks for incision check.  DISCHARGE INSTRUCTIONS:       She is to call for temperature greater than 100 degrees, persistent nausea or vomiting, heavy vaginal bleeding, and/or redness or drainage from the incision site.  DISCHARGE MEDICATIONS:        1. Prenatal  vitamin 1 p.o. q.d.  2. Pain medications as directed by ______. DD:  02/08/01 TD:  02/08/01 Job: [id number]

## 2011-01-24 NOTE — Op Note (Signed)
[facility]  Patient:    Summer Brewer, Summer Brewer                     MRN: [id number] Proc. Date: 02/05/01 Adm. Date:  [id number] Disc. Date: [id number] Attending:  Cordelia Pen Ii                           Operative Report  PREOPERATIVE DIAGNOSES:       1. Intrauterine pregnancy at term.                               2. Repeat cesarean section.                               3. Permanent, voluntary sterilization.  POSTOPERATIVE DIAGNOSES:      1. Intrauterine pregnancy at term.                               2. Repeat cesarean section.                               3. Permanent, voluntary sterilization.  PROCEDURES:                   1. Repeat low transverse cesarean section.                               2. Bilateral tubal ligation.  SURGEON:                      Willey Blade, M.D.  ANESTHESIA:                   Spinal.  ESTIMATED BLOOD LOSS:         800 cc.  COMPLICATIONS:                None.  FINDINGS:                     At 0922, through a low transverse uterine incision, a viable female infant was delivered from the vertex presentation. Apgars were 9 and 9.  The baby was a female weighing 6 lb 11 oz.  Bilateral tubal ligation was then performed.  The pelvis was visualized at the time of surgery and noted to be normal.  DESCRIPTION OF PROCEDURE:     The patient was taken to the operating room, where a spinal anesthetic was administered.  The patient was placed on the operating table in the left lateral tilt position.  The abdomen was prepped and draped in the usual sterile fashion with Betadine and sterile drapes.  A Foley catheter was inserted.  The abdomen was then entered through a Pfannenstiel incision and carried down sharply in the usual fashion.  The peritoneum was atraumatically entered.  The vesicouterine peritoneum overlying the lower uterine segment was incised and a bladder flap was bluntly and sharply created over the lower  uterine segment.  A bladder blade was then placed behind the bladder.  The uterus was then entered through a low transverse incision and carried out laterally using the operators fingers. The membranes  were entered with clear fluid noted.  The vertex was elevated into the incision and delivered easily and promptly at 0922.  The oral and nasopharynx were thoroughly bulb suctioned, the cord doubly clamped and cut and the baby handed promptly to the pediatricians.  Apgars were 9 and 9.  The baby was a female delivered at 79.  Apgars were 9 and 9.  The placenta was then manually extracted intact with a three-vessel cord without difficulty.  The interior of the uterus was wiped clean thoroughly with a wet sponge.  The uterine incision was then closed in a two-layer fashion.  The first layer was a running interlocking suture of #1 Vicryl.  A second imbricating suture was placed across the primary suture line with a running stitch of #1 Vicryl, as well.  Good hemostasis was then noted. Attention was then turned to bilateral tubal ligation.  First, the right tube was identified and traced to its fimbriated end to ensure its positive identification.  The tube was then grasped approximately 2 cm away from the uterine fundus and, through an avascular region of the mesosalpinx, an approximate 2.5 cm segment of tube was then excised using two plain ligatures.  An approximate 1.5 cm segment of tube was then excised between the two plain ligatures.  A single ligature of 0 silk was placed on the medial tubal stump.  The same procedure was repeated on the left tube. Good hemostasis was noted from the tubal sites and the uterine incision.  The pelvis was then thoroughly irrigated with copious amounts of irrigant and, once again, hemostasis was noted.  The patient was then given an IV antibiotic.  Attention was then turned to closure.  The rectus muscle and anterior peritoneum were then closed with a  running stitch of #1 Vicryl.  The fascia was then closed with a running stitch of 0 Panacryl.  The subcutaneous tissue was irrigated and made hemostatic using the Bovie cautery.  The skin was reapproximated with staples and a sterile dressing applied.  Final sponge, needle and instrument counts were correct x 3.  There were no perioperative complications. DD:  02/05/01 TD:  02/05/01 Job: [id number]

## 2011-02-12 ENCOUNTER — Other Ambulatory Visit: Payer: Self-pay | Admitting: Family Medicine

## 2011-02-21 ENCOUNTER — Ambulatory Visit (INDEPENDENT_AMBULATORY_CARE_PROVIDER_SITE_OTHER): Payer: Medicaid Other | Admitting: *Deleted

## 2011-02-21 DIAGNOSIS — Z309 Encounter for contraceptive management, unspecified: Secondary | ICD-10-CM

## 2011-02-21 DIAGNOSIS — N920 Excessive and frequent menstruation with regular cycle: Secondary | ICD-10-CM

## 2011-02-21 MED ORDER — MEDROXYPROGESTERONE ACETATE 150 MG/ML IM SUSP
150.0000 mg | Freq: Once | INTRAMUSCULAR | Status: AC
  Administered 2011-02-21: 150 mg via INTRAMUSCULAR

## 2011-09-24 ENCOUNTER — Other Ambulatory Visit: Payer: Self-pay | Admitting: Family Medicine

## 2011-09-24 NOTE — Telephone Encounter (Signed)
Refill request

## 2011-11-05 ENCOUNTER — Emergency Department (HOSPITAL_BASED_OUTPATIENT_CLINIC_OR_DEPARTMENT_OTHER)
Admission: EM | Admit: 2011-11-05 | Discharge: 2011-11-05 | Disposition: A | Discharge status: Home / Self Care | Payer: Medicaid Other | Attending: Emergency Medicine | Admitting: Emergency Medicine

## 2011-11-05 ENCOUNTER — Encounter (HOSPITAL_BASED_OUTPATIENT_CLINIC_OR_DEPARTMENT_OTHER): Payer: Self-pay | Admitting: Family Medicine

## 2011-11-05 DIAGNOSIS — M549 Dorsalgia, unspecified: Secondary | ICD-10-CM | POA: Insufficient documentation

## 2011-11-05 DIAGNOSIS — J069 Acute upper respiratory infection, unspecified: Secondary | ICD-10-CM | POA: Insufficient documentation

## 2011-11-05 DIAGNOSIS — K219 Gastro-esophageal reflux disease without esophagitis: Secondary | ICD-10-CM | POA: Insufficient documentation

## 2011-11-05 DIAGNOSIS — R197 Diarrhea, unspecified: Secondary | ICD-10-CM | POA: Insufficient documentation

## 2011-11-05 DIAGNOSIS — I10 Essential (primary) hypertension: Secondary | ICD-10-CM | POA: Insufficient documentation

## 2011-11-05 DIAGNOSIS — R110 Nausea: Secondary | ICD-10-CM | POA: Insufficient documentation

## 2011-11-05 MED ORDER — AZITHROMYCIN 250 MG PO TABS: ORAL_TABLET | ORAL | Status: AC

## 2011-11-05 MED ORDER — HYDROCODONE-HOMATROPINE 5-1.5 MG/5ML PO SYRP: 5.0000 mL | ORAL_SOLUTION | Freq: Four times a day (QID) | ORAL | Status: AC | PRN

## 2011-11-05 NOTE — Discharge Instructions (Signed)
-  Take azithromycin (antibiotic) to help treat your lingering cough which may be due to bronchitis or slight pneumonia.  -Take cough syrup to help you get rest and feel better.

## 2011-11-05 NOTE — ED Notes (Signed)
Pt c/o nausea and cough since Friday with decreased energy and appetite. Pt also sts she had diarrhea for 1 day. Pt reports taking otc cold meds.

## 2011-11-05 NOTE — ED Provider Notes (Signed)
History     CSN: [id number]  Arrival date & time 11/05/11  1724   First MD Initiated Contact with Patient 11/05/11 1739      Chief Complaint  Patient presents with  . Nausea  . Diarrhea     HPI  Patient is a 41 year old woman who presents with nonproductive cough for about 2 weeks and fever, diarrhea and nausea that began over the weekend. Patient reports she had measured temperatures of 102-103 on Saturday-Monday with chills. Her diarrhea was watery. The fever and diarrhea have since resolved. Her remaining symptoms are nausea, anorexia, low energy and lingering cough.   Past Medical History  Diagnosis Date  . Back pain   . Hypertension   . GERD (gastroesophageal reflux disease)     Past Surgical History  Procedure Date  . Tubal ligation     No family history on file.  History  Substance Use Topics  . Smoking status: Never Smoker   . Smokeless tobacco: Not on file  . Alcohol Use: No    OB History    Grav Para Term Preterm Abortions TAB SAB Ect Mult Living                  Review of Systems Pulmonary - positive for cough Consitutional - positive for fever Allergies  Latex  Home Medications   Current Outpatient Rx  Name Route Sig Dispense Refill  . LISINOPRIL-HYDROCHLOROTHIAZIDE 20-12.5 MG PO TABS Oral Take 1 tablet by mouth daily.    Marland Kitchen PSEUDOEPHEDRINE-ACETAMINOPHEN 30-500 MG PO TABS Oral Take 2 tablets by mouth every 4 (four) hours as needed. For cold      BP 133/90  Pulse 110  Temp(Src) 98.5 F (36.9 C) (Oral)  Resp 17  SpO2 100%  LMP 11/05/2011  Physical Exam General: overweight pleasant middle aged woman sitting comfortably in bed HEENT: PERRL, EOMI, no scleral icterus Cardiac: RRR, no rubs, murmurs or gallops Pulm: clear to auscultation bilaterally, moving normal volumes of air Abd: soft, nontender, nondistended, BS present Ext: warm and well perfused, no pedal edema Neuro: alert and oriented X3, cranial nerves II-XII grossly  intact Psych - normal affect  ED Course  Procedures      MDM  Prescribed patient azithromycin X 5 days and hydrocodone cough syrup since she has lingering cough which has not improved on its own.        Margorie John, MD 11/05/11 1813  I have personally seen and examined the patient.  I have discussed the plan of care with the resident.  I have reviewed the documentation on PMH/FH/Soc. History.  I have reviewed the documentation of the resident and agree.   Joya Gaskins, MD 11/06/11 [phone]

## 2012-01-09 ENCOUNTER — Encounter: Payer: Self-pay | Admitting: Family Medicine

## 2012-01-09 ENCOUNTER — Ambulatory Visit (INDEPENDENT_AMBULATORY_CARE_PROVIDER_SITE_OTHER): Payer: Medicaid Other | Admitting: Family Medicine

## 2012-01-09 VITALS — BP 148/100 | HR 80 | Temp 98.8°F | Ht 66.5 in | Wt 250.0 lb

## 2012-01-09 DIAGNOSIS — N390 Urinary tract infection, site not specified: Secondary | ICD-10-CM | POA: Insufficient documentation

## 2012-01-09 DIAGNOSIS — M545 Low back pain, unspecified: Secondary | ICD-10-CM

## 2012-01-09 DIAGNOSIS — I10 Essential (primary) hypertension: Secondary | ICD-10-CM

## 2012-01-09 LAB — POCT URINALYSIS DIPSTICK
Bilirubin, UA: NEGATIVE
Glucose, UA: NEGATIVE
Ketones, UA: NEGATIVE
Protein, UA: NEGATIVE
Spec Grav, UA: 1.025

## 2012-01-09 LAB — POCT UA - MICROSCOPIC ONLY

## 2012-01-09 MED ORDER — CYCLOBENZAPRINE HCL 5 MG PO TABS: 10.0000 mg | ORAL_TABLET | Freq: Three times a day (TID) | ORAL | Status: DC | PRN

## 2012-01-09 MED ORDER — KETOROLAC TROMETHAMINE 60 MG/2ML IM SOLN
60.0000 mg | Freq: Once | INTRAMUSCULAR | Status: AC
  Administered 2012-01-09: 60 mg via INTRAMUSCULAR

## 2012-01-09 MED ORDER — LISINOPRIL-HYDROCHLOROTHIAZIDE 20-12.5 MG PO TABS: 1.0000 | ORAL_TABLET | Freq: Every day | ORAL | Status: DC

## 2012-01-09 MED ORDER — DEXAMETHASONE SODIUM PHOSPHATE 10 MG/ML IJ SOLN
10.0000 mg | Freq: Once | INTRAMUSCULAR | Status: AC
  Administered 2012-01-09: 10 mg via INTRAMUSCULAR

## 2012-01-09 MED ORDER — CEPHALEXIN 500 MG PO CAPS: 500.0000 mg | ORAL_CAPSULE | Freq: Two times a day (BID) | ORAL | Status: AC

## 2012-01-09 NOTE — Progress Notes (Signed)
Addended by: Garen Grams F on: 01/09/2012 09:37 AM   Modules accepted: Orders

## 2012-01-09 NOTE — Assessment & Plan Note (Signed)
Patient had no CVA tenderness but does have signs of the u urinary tract infection. At this time we will treat has used Keflex twice a day for 7 days.

## 2012-01-09 NOTE — Assessment & Plan Note (Signed)
Patient's back pain seems to be mechanical in nature. Patient likely is secondary to her obesity and deconditioning. Patient does have flat feet which I think is also contributing. Give patient exercises to do at home Acute pain treated with Toradol and Decadron. Warned of potential side effects patient given Flexeril for home use as well. Followup in 2 weeks if not better.

## 2012-01-09 NOTE — Progress Notes (Signed)
  Subjective:    Patient ID: Summer Brewer, female    DOB: 1971-04-13, 41 y.o.   MRN: [id number]  HPI 41 year old female coming in with back pain and questionable UTI. Onset patient states that this has been insidious onset over the course of months the potential years usually goes away on its own but recently has continued everyday for the last couple weeks. Patient describes it as a nagging sensation no radiation worse with changing positions improved with heat  Patient also states she's had some mild discomfort with urination still plenty of urine no hematuria no CVA tenderness denies fevers or chills  Review of SystemsDenies fever, chills, nausea vomiting, chest pain, shortness of breath dyspnea on exertion or numbness in extremities     Objective:   Physical Exam General: No apparent distress Cardiovascular: Regular in rhythm no murmur Pulmonary: Clear to auscultation bilaterally Abdomen: Bowel sounds positive nontender no CVA tenderness Extremities: Neurovascularly intact Skin: No rash     Assessment & Plan:

## 2012-01-09 NOTE — Assessment & Plan Note (Signed)
Elevated today but patient did not take her medications. Encourage her to take it on a regular daily basis and followup with PCP in 2 weeks.

## 2012-01-09 NOTE — Progress Notes (Signed)
Addended by: Garen Grams F on: 01/09/2012 04:21 PM   Modules accepted: Orders

## 2012-01-09 NOTE — Patient Instructions (Signed)
Is nice to meet you. Make sure you take your blood pressure medicine. I gave you to injections today that'll help with the back spasm. I'm giving you some medicine to try for your muscle spasms if you need them. This medicine might make you tired. I also want you to try to get Spenco orthotics. These are available at [employer] [facility]. Please try to do the below exercises 2 times a day.Back Exercises These exercises may help you when beginning to rehabilitate your injury. Your symptoms may resolve with or without further involvement from your physician, physical therapist or athletic trainer. While completing these exercises, remember:   Restoring tissue flexibility helps normal motion to return to the joints. This allows healthier, less painful movement and activity.   An effective stretch should be held for at least 30 seconds.   A stretch should never be painful. You should only feel a gentle lengthening or release in the stretched tissue.  STRETCH - Extension, Prone on Elbows   Lie on your stomach on the floor, a bed will be too soft. Place your palms about shoulder width apart and at the height of your head.   Place your elbows under your shoulders. If this is too painful, stack pillows under your chest.   Allow your body to relax so that your hips drop lower and make contact more completely with the floor.   Hold this position for __________ seconds.   Slowly return to lying flat on the floor.  Repeat __________ times. Complete this exercise __________ times per day.  RANGE OF MOTION - Extension, Prone Press Ups   Lie on your stomach on the floor, a bed will be too soft. Place your palms about shoulder width apart and at the height of your head.   Keeping your back as relaxed as possible, slowly straighten your elbows while keeping your hips on the floor. You may adjust the placement of your hands to maximize your comfort. As you gain motion, your hands will come more underneath your  shoulders.   Hold this position __________ seconds.   Slowly return to lying flat on the floor.  Repeat __________ times. Complete this exercise __________ times per day.  RANGE OF MOTION- Quadruped, Neutral Spine   Assume a hands and knees position on a firm surface. Keep your hands under your shoulders and your knees under your hips. You may place padding under your knees for comfort.   Drop your head and point your tail bone toward the ground below you. This will round out your low back like an angry cat. Hold this position for __________ seconds.   Slowly lift your head and release your tail bone so that your back sags into a large arch, like an old horse.   Hold this position for __________ seconds.   Repeat this until you feel limber in your low back.   Now, find your "sweet spot." This will be the most comfortable position somewhere between the two previous positions. This is your neutral spine. Once you have found this position, tense your stomach muscles to support your low back.   Hold this position for __________ seconds.  Repeat __________ times. Complete this exercise __________ times per day.  STRETCH - Flexion, Single Knee to Chest   Lie on a firm bed or floor with both legs extended in front of you.   Keeping one leg in contact with the floor, bring your opposite knee to your chest. Hold your leg in place by  either grabbing behind your thigh or at your knee.   Pull until you feel a gentle stretch in your low back. Hold __________ seconds.   Slowly release your grasp and repeat the exercise with the opposite side.  Repeat __________ times. Complete this exercise __________ times per day.  STRETCH - Hamstrings, Standing  Stand or sit and extend your right / left leg, placing your foot on a chair or foot stool   Keeping a slight arch in your low back and your hips straight forward.   Lead with your chest and lean forward at the waist until you feel a gentle stretch  in the back of your right / left knee or thigh. (When done correctly, this exercise requires leaning only a small distance.)   Hold this position for __________ seconds.  Repeat __________ times. Complete this stretch __________ times per day. STRENGTHENING - Deep Abdominals, Pelvic Tilt   Lie on a firm bed or floor. Keeping your legs in front of you, bend your knees so they are both pointed toward the ceiling and your feet are flat on the floor.   Tense your lower abdominal muscles to press your low back into the floor. This motion will rotate your pelvis so that your tail bone is scooping upwards rather than pointing at your feet or into the floor.   With a gentle tension and even breathing, hold this position for __________ seconds.  Repeat __________ times. Complete this exercise __________ times per day.  STRENGTHENING - Abdominals, Crunches   Lie on a firm bed or floor. Keeping your legs in front of you, bend your knees so they are both pointed toward the ceiling and your feet are flat on the floor. Cross your arms over your chest.   Slightly tip your chin down without bending your neck.   Tense your abdominals and slowly lift your trunk high enough to just clear your shoulder blades. Lifting higher can put excessive stress on the low back and does not further strengthen your abdominal muscles.   Control your return to the starting position.  Repeat __________ times. Complete this exercise __________ times per day.  STRENGTHENING - Quadruped, Opposite UE/LE Lift   Assume a hands and knees position on a firm surface. Keep your hands under your shoulders and your knees under your hips. You may place padding under your knees for comfort.   Find your neutral spine and gently tense your abdominal muscles so that you can maintain this position. Your shoulders and hips should form a rectangle that is parallel with the floor and is not twisted.   Keeping your trunk steady, lift your right  hand no higher than your shoulder and then your left leg no higher than your hip. Make sure you are not holding your breath. Hold this position __________ seconds.   Continuing to keep your abdominal muscles tense and your back steady, slowly return to your starting position. Repeat with the opposite arm and leg.  Repeat __________ times. Complete this exercise __________ times per day. Document Released: 09/12/2005 Document Revised: 08/14/2011 Document Reviewed: 12/07/2008 [facility] Patient Information 2012 [place], [place].

## 2012-01-14 ENCOUNTER — Other Ambulatory Visit: Payer: Self-pay | Admitting: Family Medicine

## 2012-01-14 MED ORDER — METRONIDAZOLE 0.75 % VA GEL: 1.0000 | Freq: Two times a day (BID) | VAGINAL | Status: AC

## 2012-01-14 MED ORDER — FLUCONAZOLE 200 MG PO TABS: 200.0000 mg | ORAL_TABLET | Freq: Every day | ORAL | Status: AC

## 2012-01-14 NOTE — Telephone Encounter (Signed)
done

## 2012-01-14 NOTE — Telephone Encounter (Signed)
Patient was in last Friday for a UTI, was given a abx which has now caused a yeast infection.  She would like some medication for that sent to [facility] not the pharmacy in [facility].  Also she needs a refill on Metrogel.

## 2012-01-16 ENCOUNTER — Encounter (HOSPITAL_BASED_OUTPATIENT_CLINIC_OR_DEPARTMENT_OTHER): Payer: Self-pay | Admitting: *Deleted

## 2012-01-16 ENCOUNTER — Emergency Department (HOSPITAL_BASED_OUTPATIENT_CLINIC_OR_DEPARTMENT_OTHER)
Admission: EM | Admit: 2012-01-16 | Discharge: 2012-01-16 | Disposition: A | Discharge status: Home / Self Care | Payer: Medicaid Other | Attending: Emergency Medicine | Admitting: Emergency Medicine

## 2012-01-16 DIAGNOSIS — Z79899 Other long term (current) drug therapy: Secondary | ICD-10-CM | POA: Insufficient documentation

## 2012-01-16 DIAGNOSIS — R202 Paresthesia of skin: Secondary | ICD-10-CM

## 2012-01-16 DIAGNOSIS — K219 Gastro-esophageal reflux disease without esophagitis: Secondary | ICD-10-CM | POA: Insufficient documentation

## 2012-01-16 DIAGNOSIS — R209 Unspecified disturbances of skin sensation: Secondary | ICD-10-CM | POA: Insufficient documentation

## 2012-01-16 DIAGNOSIS — I10 Essential (primary) hypertension: Secondary | ICD-10-CM | POA: Insufficient documentation

## 2012-01-16 LAB — BASIC METABOLIC PANEL
BUN: 16 mg/dL (ref 6–23)
CO2: 28 mEq/L (ref 19–32)
Calcium: 9.5 mg/dL (ref 8.4–10.5)
Creatinine, Ser: 1 mg/dL (ref 0.50–1.10)

## 2012-01-16 LAB — CBC
MCHC: 33.6 g/dL (ref 30.0–36.0)
MCV: 82.6 fL (ref 78.0–100.0)
Platelets: 211 10*3/uL (ref 150–400)
RDW: 14.6 % (ref 11.5–15.5)
WBC: 6.9 10*3/uL (ref 4.0–10.5)

## 2012-01-16 NOTE — ED Provider Notes (Signed)
History     CSN: [id number]  Arrival date & time 01/16/12  2025   First MD Initiated Contact with Patient 01/16/12 2027      Chief Complaint  Patient presents with  . Numbness    (Consider location/radiation/quality/duration/timing/severity/associated sxs/prior treatment) HPI Patient complaining of numbness and tingling in bilateral hands and feet for several days.  She has no lateralized numbness and no weakness.  She denies headache or visual problems.  Patient is on antihypertensives.  She has not previously had symptoms.  Numbness is nonradiating, constant , moderated and has no associated symptoms.   Past Medical History  Diagnosis Date  . Back pain   . Hypertension   . GERD (gastroesophageal reflux disease)     Past Surgical History  Procedure Date  . Tubal ligation     History reviewed. No pertinent family history.  History  Substance Use Topics  . Smoking status: Never Smoker   . Smokeless tobacco: Not on file  . Alcohol Use: No    OB History    Grav Para Term Preterm Abortions TAB SAB Ect Mult Living                  Review of Systems  HENT:       History of dysphagia  All other systems reviewed and are negative.    Allergies  Latex  Home Medications   Current Outpatient Rx  Name Route Sig Dispense Refill  . CEPHALEXIN 500 MG PO CAPS Oral Take 1 capsule (500 mg total) by mouth 2 (two) times daily. 14 capsule 0  . CYCLOBENZAPRINE HCL 5 MG PO TABS Oral Take 2 tablets (10 mg total) by mouth 3 (three) times daily as needed. For muscle spasm 90 tablet 1  . LISINOPRIL-HYDROCHLOROTHIAZIDE 20-12.5 MG PO TABS Oral Take 1 tablet by mouth daily. 30 tablet 2  . FLUCONAZOLE 200 MG PO TABS Oral Take 1 tablet (200 mg total) by mouth daily. 1 tablet 0  . METRONIDAZOLE 0.75 % VA GEL Vaginal Place 1 Applicatorful vaginally 2 (two) times daily. 70 g 0    BP 156/101  Pulse 116  Temp 98.2 F (36.8 C)  Resp 20  Ht 5\' 7"  (1.702 m)  Wt 250 lb (113.399 kg)  BMI  39.16 kg/m2  SpO2 100%  Physical Exam  Nursing note and vitals reviewed. Constitutional: She is oriented to person, place, and time. She appears well-developed and well-nourished.  HENT:  Head: Normocephalic and atraumatic.  Eyes: Conjunctivae and EOM are normal. Pupils are equal, round, and reactive to light.  Neck: Normal range of motion. Neck supple.  Cardiovascular: Normal rate and regular rhythm.   Pulmonary/Chest: Effort normal and breath sounds normal.  Abdominal: Soft.  Musculoskeletal: Normal range of motion.  Neurological: She is alert and oriented to person, place, and time. She has normal strength and normal reflexes. No sensory deficit. GCS eye subscore is 4. GCS verbal subscore is 5. GCS motor subscore is 6.       Two point discrimination intact fingers and toes. No weakness noted. Patient ambulated without difficulty.   Skin: Skin is warm, dry and intact.    ED Course  Procedures (including critical care time)  Labs Reviewed  BASIC METABOLIC PANEL - Abnormal; Notable for the following:    Glucose, Bld 100 (*)    GFR calc non Af Amer 71 (*)    GFR calc Af Amer 82 (*)    All other components within normal limits  CBC  No results found.   1. Paresthesia       MDM           Hilario Quarry, MD 01/16/12 2136

## 2012-01-16 NOTE — ED Notes (Signed)
Pt reports bilateral numbness in arms and legs. States that it started a couple of days ago. Pt also complaining of difficulty swallowing.

## 2012-01-16 NOTE — Discharge Instructions (Signed)
Paresthesia  Paresthesia is an abnormal burning or prickling sensation. This sensation is generally felt in the hands, arms, legs, or feet. However, it may occur in any part of the body. It is usually not painful. The feeling may be described as:  · Tingling or numbness.  · "Pins and needles."  · Skin crawling.  · Buzzing.  · Limbs "falling asleep."  · Itching.  Most people experience temporary (transient) paresthesia at some time in their lives.  CAUSES   Paresthesia may occur when you breathe too quickly (hyperventilation). It can also occur without any apparent cause. Commonly, paresthesia occurs when pressure is placed on a nerve. The feeling quickly goes away once the pressure is removed. For some people, however, paresthesia is a long-lasting (chronic) condition caused by an underlying disorder. The underlying disorder may be:  · A traumatic, direct injury to nerves. Examples include a:  · Broken (fractured) neck.  · Fractured skull.  · A disorder affecting the brain and spinal cord (central nervous system). Examples include:  · Transverse myelitis.  · Encephalitis.  · Transient ischemic attack.  · Multiple sclerosis.  · Stroke.  · Tumor or blood vessel problems, such as an arteriovenous malformation pressing against the brain or spinal cord.  · A condition that damages the peripheral nerves (peripheral neuropathy). Peripheral nerves are not part of the brain and spinal cord. These conditions include:  · Diabetes.  · Peripheral vascular disease.  · Nerve entrapment syndromes, such as carpal tunnel syndrome.  · Shingles.  · Hypothyroidism.  · Vitamin B12 deficiencies.  · Alcoholism.  · Heavy metal poisoning (lead, arsenic).  · Rheumatoid arthritis.  · Systemic lupus erythematosus.  DIAGNOSIS   Your caregiver will attempt to find the underlying cause of your paresthesia. Your caregiver may:  · Take your medical history.  · Perform a physical exam.  · Order various lab tests.  · Order imaging tests.  TREATMENT    Treatment for paresthesia depends on the underlying cause.  HOME CARE INSTRUCTIONS  · Avoid drinking alcohol.  · You may consider massage or acupuncture to help relieve your symptoms.  · Keep all follow-up appointments as directed by your caregiver.  SEEK IMMEDIATE MEDICAL CARE IF:   · You feel weak.  · You have trouble walking or moving.  · You have problems with speech or vision.  · You feel confused.  · You cannot control your bladder or bowel movements.  · You feel numbness after an injury.  · You faint.  · Your burning or prickling feeling gets worse when walking.  · You have pain, cramps, or dizziness.  · You develop a rash.  MAKE SURE YOU:  · Understand these instructions.  · Will watch your condition.  · Will get help right away if you are not doing well or get worse.  Document Released: 08/15/2002 Document Revised: 08/14/2011 Document Reviewed: 05/16/2011  ExitCare® Patient Information ©2012 [employer].

## 2012-01-16 NOTE — ED Notes (Signed)
Pt reports bilateral numbness and tingling in arms and legs. Pt reports chronic back pain. Pt also c/o difficulty swallowing and is being followed by [facility] for it.

## 2012-04-02 ENCOUNTER — Ambulatory Visit: Payer: Medicaid Other | Admitting: Emergency Medicine

## 2012-04-08 ENCOUNTER — Telehealth: Payer: Self-pay | Admitting: Family Medicine

## 2012-04-08 NOTE — Telephone Encounter (Signed)
Summer Brewer is requesting a refill on her Metrogel rx.  Have frequent b/v and this is the med she take for it.

## 2012-04-09 ENCOUNTER — Other Ambulatory Visit (HOSPITAL_COMMUNITY)
Admission: RE | Admit: 2012-04-09 | Discharge: 2012-04-09 | Disposition: A | Discharge status: Home / Self Care | Payer: Medicaid Other | Source: Ambulatory Visit | Attending: Family Medicine | Admitting: Family Medicine

## 2012-04-09 ENCOUNTER — Ambulatory Visit (INDEPENDENT_AMBULATORY_CARE_PROVIDER_SITE_OTHER): Payer: Medicaid Other | Admitting: Family Medicine

## 2012-04-09 VITALS — BP 140/88 | HR 92 | Ht 66.5 in | Wt 244.5 lb

## 2012-04-09 DIAGNOSIS — Z113 Encounter for screening for infections with a predominantly sexual mode of transmission: Secondary | ICD-10-CM | POA: Insufficient documentation

## 2012-04-09 DIAGNOSIS — N760 Acute vaginitis: Secondary | ICD-10-CM

## 2012-04-09 DIAGNOSIS — A499 Bacterial infection, unspecified: Secondary | ICD-10-CM

## 2012-04-09 DIAGNOSIS — B9689 Other specified bacterial agents as the cause of diseases classified elsewhere: Secondary | ICD-10-CM

## 2012-04-09 DIAGNOSIS — B372 Candidiasis of skin and nail: Secondary | ICD-10-CM

## 2012-04-09 LAB — POCT WET PREP (WET MOUNT)

## 2012-04-09 MED ORDER — NYSTATIN 100000 UNIT/GM EX POWD: Freq: Four times a day (QID) | CUTANEOUS | Status: AC

## 2012-04-09 MED ORDER — METRONIDAZOLE 0.75 % VA GEL: 1.0000 | Freq: Every day | VAGINAL | Status: AC

## 2012-04-09 NOTE — Assessment & Plan Note (Addendum)
Wet rep positive.  Will treat with metrogel.  May be exacerbating intertrigo with continued wetness.

## 2012-04-09 NOTE — Progress Notes (Signed)
  Subjective:    Patient ID: Summer Brewer, female    DOB: 07/06/1971, 41 y.o.   MRN: [id number]  HPI Here for evaluation of rash  States every summer she gets interregional rash in bilateral groin.  Sore.  Notes stays wet in panties- wears poise pads just in case.  Last intercourse 1 month ago.  Denis fever, chills, abd pain. Recent STI.   Review of Systems See HPI    Objective:   Physical Exam GEN:  NAD, obese Pelvic Exam:        External: normal female genitalia without lesions or masses        Vagina: normal without lesions or masses.  Thick white discharge noted.        Cervix: normal without lesions or masses        Adnexa: normal bimanual exam without masses or fullness        Uterus: normal by palpation        Samples for Wet prep, GC/Chlamydia obtained Skin:  Maceration of bilateral groin with no cellulitis.        Assessment & Plan:

## 2012-04-09 NOTE — Patient Instructions (Addendum)
Intertrigo Intertrigo is a skin condition that occurs in between folds of skin in places on the body that rub together a lot and do not get much ventilation. It is caused by heat, moisture, friction, sweat retention, and lack of air circulation, which produces red, irritated patches and, sometimes, scaling or drainage. People who have diabetes, who are obese, or who have treatment with antibiotics are at increased risk for intertrigo. The most common sites for intertrigo to occur include:  The groin.   The breasts.   The armpits.   Folds of abdominal skin.   Webbed spaces between the fingers or toes.  Intertrigo may be aggravated by:  Sweat.   Feces.   Yeast or bacteria that are present near skin folds.   Urine.   Vaginal discharge.  HOME CARE INSTRUCTIONS  The following steps can be taken to reduce friction and keep the affected area cool and dry:   Expose skin folds to the air.   Keep deep skin folds separated with cotton or linen cloth. Avoid tight fitting clothing that could cause chafing.   Wear open-toed shoes or sandals to help reduce moisture between the toes.   Apply absorbent powders to affected areas as directed by your caregiver.   Apply over-the-counter barrier pastes, such as zinc oxide, as directed by your caregiver.   If you develop a fungal infection in the affected area, your caregiver may have you use antifungal creams.  SEEK MEDICAL CARE IF:   The rash is not improving after 1 week of treatment.   The rash is getting worse (more red, more swollen, more painful, or spreading).   You have a fever or chills.  MAKE SURE YOU:   Understand these instructions.   Will watch your condition.   Will get help right away if you are not doing well or get worse.  Document Released: 08/25/2005 Document Revised: 08/14/2011 Document Reviewed: 02/07/2010 ExitCare Patient Information 2012 [employer]. 

## 2012-04-09 NOTE — Assessment & Plan Note (Signed)
rx nystatin powder, advsed to keep area dry.  Gave info handout.

## 2012-04-12 NOTE — Telephone Encounter (Signed)
This was addressed by Dr. Earnest Bailey during an office visit on 04/09/12. I appreciate her assistance in seeing my patient.

## 2012-04-13 ENCOUNTER — Encounter: Payer: Self-pay | Admitting: Family Medicine

## 2012-04-22 ENCOUNTER — Telehealth: Payer: Self-pay | Admitting: Family Medicine

## 2012-04-22 NOTE — Telephone Encounter (Signed)
Please call this patient regarding switching to a female provider. She said that she had requested this before but is still showing in the system Dr. Clinton Sawyer.

## 2012-04-22 NOTE — Telephone Encounter (Signed)
No record of patient completing form requesting PCP change to female.   Will call patient for more info.  Left message to call our office back.  Gaylene Brooks, RN

## 2012-04-26 ENCOUNTER — Encounter: Payer: Self-pay | Admitting: *Deleted
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
## 2012-04-26 NOTE — Telephone Encounter (Signed)
This encounter was created in error - please disregard.

## 2012-06-02 ENCOUNTER — Other Ambulatory Visit: Payer: Self-pay | Admitting: Family Medicine

## 2012-07-19 ENCOUNTER — Encounter: Payer: Self-pay | Admitting: Family Medicine

## 2012-07-19 ENCOUNTER — Ambulatory Visit (INDEPENDENT_AMBULATORY_CARE_PROVIDER_SITE_OTHER): Payer: Medicaid Other | Admitting: Family Medicine

## 2012-07-19 VITALS — BP 158/102 | HR 84 | Temp 98.1°F | Ht 66.5 in | Wt 247.0 lb

## 2012-07-19 DIAGNOSIS — D509 Iron deficiency anemia, unspecified: Secondary | ICD-10-CM

## 2012-07-19 DIAGNOSIS — N924 Excessive bleeding in the premenopausal period: Secondary | ICD-10-CM | POA: Insufficient documentation

## 2012-07-19 DIAGNOSIS — N946 Dysmenorrhea, unspecified: Secondary | ICD-10-CM

## 2012-07-19 DIAGNOSIS — Z309 Encounter for contraceptive management, unspecified: Secondary | ICD-10-CM

## 2012-07-19 DIAGNOSIS — I10 Essential (primary) hypertension: Secondary | ICD-10-CM

## 2012-07-19 DIAGNOSIS — D649 Anemia, unspecified: Secondary | ICD-10-CM

## 2012-07-19 DIAGNOSIS — N920 Excessive and frequent menstruation with regular cycle: Secondary | ICD-10-CM

## 2012-07-19 LAB — POCT URINE PREGNANCY: Preg Test, Ur: NEGATIVE

## 2012-07-19 MED ORDER — MEDROXYPROGESTERONE ACETATE 150 MG/ML IM SUSP: 150.0000 mg | INTRAMUSCULAR | Status: DC

## 2012-07-19 MED ORDER — HYDROCHLOROTHIAZIDE 25 MG PO TABS: 25.0000 mg | ORAL_TABLET | Freq: Every day | ORAL | Status: DC

## 2012-07-19 MED ORDER — MEDROXYPROGESTERONE ACETATE 150 MG/ML IM SUSP
150.0000 mg | Freq: Once | INTRAMUSCULAR | Status: AC
  Administered 2012-07-19: 150 mg via INTRAMUSCULAR

## 2012-07-19 MED ORDER — LISINOPRIL 40 MG PO TABS: 40.0000 mg | ORAL_TABLET | Freq: Every day | ORAL | Status: DC

## 2012-07-19 NOTE — Assessment & Plan Note (Signed)
Check CBC today.  

## 2012-07-19 NOTE — Assessment & Plan Note (Signed)
Naproxen at onset of period Pelvic/TV sono

## 2012-07-19 NOTE — Assessment & Plan Note (Signed)
Increase lisinopril to 40 mg daily and HCTZ to 25 mg daily. Recheck BP next visit

## 2012-07-19 NOTE — Assessment & Plan Note (Addendum)
Restart Depo Provera Pelvic/Transvag sono Discussed IUD vs endometrial ablation vs hysterectomy as other possible solutions Familial trend - consider VonWillebrand disease

## 2012-07-19 NOTE — Patient Instructions (Addendum)
Take naproxen (Alleve), Advil, Ibuprofen or motrin at the onset of your period to reduce pain and bleeding. Follow up in one month after ultrasound.  Increase your blood pressure medication to 2 pills daily.  Menorrhagia Dysfunctional uterine bleeding is different from a normal menstrual period. When periods are heavy or there is more bleeding than is usual for you, it is called menorrhagia. It may be caused by hormonal imbalance, or physical, metabolic, or other problems. Examination is necessary in order that your caregiver may treat treatable causes. If this is a continuing problem, a D&C may be needed. That means that the cervix (the opening of the uterus or womb) is dilated (stretched larger) and the lining of the uterus is scraped out. The tissue scraped out is then examined under a microscope by a specialist (pathologist) to make sure there is nothing of concern that needs further or more extensive treatment. HOME CARE INSTRUCTIONS   If medications were prescribed, take exactly as directed. Do not change or switch medications without consulting your caregiver.  Long term heavy bleeding may result in iron deficiency. Your caregiver may have prescribed iron pills. They help replace the iron your body lost from heavy bleeding. Take exactly as directed. Iron may cause constipation. If this becomes a problem, increase the bran, fruits, and roughage in your diet.  Do not take aspirin or medicines that contain aspirin one week before or during your menstrual period. Aspirin may make the bleeding worse.  If you need to change your sanitary pad or tampon more than once every 2 hours, stay in bed and rest as much as possible until the bleeding stops.  Eat well-balanced meals. Eat foods high in iron. Examples are leafy green vegetables, meat, liver, eggs, and whole grain breads and cereals. Do not try to lose weight until the abnormal bleeding has stopped and your blood iron level is back to  normal. SEEK MEDICAL CARE IF:   You need to change your sanitary pad or tampon more than once an hour.  You develop nausea (feeling sick to your stomach) and vomiting, dizziness, or diarrhea while you are taking your medicine.  You have any problems that may be related to the medicine you are taking. SEEK IMMEDIATE MEDICAL CARE IF:   You have a fever.  You develop chills.  You develop severe bleeding or start to pass blood clots.  You feel dizzy or faint. MAKE SURE YOU:   Understand these instructions.  Will watch your condition.  Will get help right away if you are not doing well or get worse. Document Released: 08/25/2005 Document Revised: 11/17/2011 Document Reviewed: 04/14/2008 [facility] Patient Information 2013 [place], [place].

## 2012-07-19 NOTE — Progress Notes (Signed)
  Subjective:    Patient ID: Summer Brewer, female    DOB: 02-08-1971, 41 y.o.   MRN: [id number]  HPI 41 y.o. female with heavy menstrual bleeding. Regular periods lasting 2-3 weeks (last one started 3 weeks ago, still having it). Uses super tampon and 2 pads at a time, changing q 2 hours. Golf-ball sized clots. Severe cramping throughout. Menarche age 75, regular periods, always heavy but getting heavier. Sono in 2009 showed normal uterus, endometrium and ovaries. Occasional spotting between periods but only for a day. Has been anemic in the past and does feel fatigued, weak.  Was on Depo shots for several years, which helped control bleeding. Stopped 6 or 9 months ago and now is having heavy bleeding again. Would like to restart Depo. Pt is s/p BTL for contraception.  Hx abnormal pap smear several years ago, colpo and cryo done per pt. Last pap on file is 2010, but pt states she has had gyn exam every year.  Pt also states that her blood pressure had been high recently despite her medications. She is having more frequent headaches, which she attributes to her BP.   Review of Systems  Constitutional: Positive for fatigue. Negative for fever and unexpected weight change.  Respiratory: Negative for chest tightness and shortness of breath.   Cardiovascular: Negative for chest pain and palpitations.  Gastrointestinal: Negative for nausea and vomiting.  Genitourinary: Positive for vaginal bleeding, menstrual problem and pelvic pain. Negative for dysuria, vaginal discharge and difficulty urinating.  Neurological: Positive for headaches. Negative for dizziness and light-headedness.   Problem list, PMH, PSH, imaging, labs reviewed.    Objective:   Physical Exam  Constitutional: She is oriented to person, place, and time. She appears well-developed and well-nourished. No distress.  HENT:  Head: Normocephalic and atraumatic.  Eyes: Conjunctivae normal and EOM are normal. Pupils are equal, round,  and reactive to light.  Neck: Normal range of motion. No thyromegaly present.  Cardiovascular: Normal rate, regular rhythm and normal heart sounds.   Pulmonary/Chest: Effort normal and breath sounds normal. No respiratory distress.  Abdominal: Soft. There is no tenderness. There is no rebound and no guarding.  Musculoskeletal: Normal range of motion.  Neurological: She is alert and oriented to person, place, and time.  Skin: Skin is warm and dry.  Psychiatric: She has a normal mood and affect.        Assessment & Plan:  41 y.o. female with menorrhagia (regular cycles) and dysmenorrea Poorly controlled HTN Needs Pap smear next visit

## 2012-07-20 LAB — CBC
Hemoglobin: 11.9 g/dL — ABNORMAL LOW (ref 12.0–15.0)
MCH: 26.1 pg (ref 26.0–34.0)
Platelets: 198 10*3/uL (ref 150–400)
RBC: 4.56 MIL/uL (ref 3.87–5.11)
WBC: 5.4 10*3/uL (ref 4.0–10.5)

## 2012-07-23 ENCOUNTER — Ambulatory Visit (HOSPITAL_COMMUNITY)
Admission: RE | Admit: 2012-07-23 | Discharge: 2012-07-23 | Disposition: A | Discharge status: Home / Self Care | Payer: Medicaid Other | Source: Ambulatory Visit | Attending: Family Medicine | Admitting: Family Medicine

## 2012-07-23 DIAGNOSIS — N920 Excessive and frequent menstruation with regular cycle: Secondary | ICD-10-CM | POA: Insufficient documentation

## 2012-09-13 ENCOUNTER — Telehealth: Payer: Self-pay | Admitting: Family Medicine

## 2012-09-13 NOTE — Telephone Encounter (Signed)
Returned call to patient.    Patient states she is anemic and has been bleeding since 08/13/12.  Hgb at last office visit---11.9.  Patient states she is unable to tolerate bleeding and cramping.  Using "super plus tampons" and changing every 2 hours.  Denies any dizziness or shortness.  Not due for Depo until 10/01/12, but wants to change to IUD as discussed with Dr. Thad Ranger at last office visit.  Dr. Pollie Meyer out of office until 09/17/12 and does not have appt until 09/21/12.  Patient requesting to be seen sooner.  Appt scheduled with Dr. Thad Ranger for 09/15/12 at 1:30 pm.      Gaylene Brooks, RN

## 2012-09-13 NOTE — Telephone Encounter (Signed)
Patient is calling to schedule her next depo, but she would also like to speak to the nurse about her break through bleeding.  She has been bleeding non stop for a month and it isn't getting any lighter, it is getting heavier and heavier.

## 2012-09-15 ENCOUNTER — Encounter: Payer: Self-pay | Admitting: Family Medicine

## 2012-09-15 ENCOUNTER — Ambulatory Visit (INDEPENDENT_AMBULATORY_CARE_PROVIDER_SITE_OTHER): Payer: Medicaid Other | Admitting: Family Medicine

## 2012-09-15 VITALS — BP 141/95 | HR 94 | Temp 98.6°F | Ht 66.5 in | Wt 242.0 lb

## 2012-09-15 DIAGNOSIS — N920 Excessive and frequent menstruation with regular cycle: Secondary | ICD-10-CM

## 2012-09-15 DIAGNOSIS — I10 Essential (primary) hypertension: Secondary | ICD-10-CM

## 2012-09-15 DIAGNOSIS — Z23 Encounter for immunization: Secondary | ICD-10-CM

## 2012-09-15 DIAGNOSIS — N946 Dysmenorrhea, unspecified: Secondary | ICD-10-CM

## 2012-09-15 MED ORDER — METRONIDAZOLE 0.75 % VA GEL: Freq: Every day | VAGINAL | Status: DC

## 2012-09-15 MED ORDER — MEDROXYPROGESTERONE ACETATE 10 MG PO TABS: 10.0000 mg | ORAL_TABLET | Freq: Every day | ORAL | Status: DC

## 2012-09-15 NOTE — Patient Instructions (Signed)
Menorrhagia Dysfunctional uterine bleeding is different from a normal menstrual period. When periods are heavy or there is more bleeding than is usual for you, it is called menorrhagia. It may be caused by hormonal imbalance, or physical, metabolic, or other problems. Examination is necessary in order that your caregiver may treat treatable causes. If this is a continuing problem, a D&C may be needed. That means that the cervix (the opening of the uterus or womb) is dilated (stretched larger) and the lining of the uterus is scraped out. The tissue scraped out is then examined under a microscope by a specialist (pathologist) to make sure there is nothing of concern that needs further or more extensive treatment. HOME CARE INSTRUCTIONS   If medications were prescribed, take exactly as directed. Do not change or switch medications without consulting your caregiver.  Long term heavy bleeding may result in iron deficiency. Your caregiver may have prescribed iron pills. They help replace the iron your body lost from heavy bleeding. Take exactly as directed. Iron may cause constipation. If this becomes a problem, increase the bran, fruits, and roughage in your diet.  Do not take aspirin or medicines that contain aspirin one week before or during your menstrual period. Aspirin may make the bleeding worse.  If you need to change your sanitary pad or tampon more than once every 2 hours, stay in bed and rest as much as possible until the bleeding stops.  Eat well-balanced meals. Eat foods high in iron. Examples are leafy green vegetables, meat, liver, eggs, and whole grain breads and cereals. Do not try to lose weight until the abnormal bleeding has stopped and your blood iron level is back to normal. SEEK MEDICAL CARE IF:   You need to change your sanitary pad or tampon more than once an hour.  You develop nausea (feeling sick to your stomach) and vomiting, dizziness, or diarrhea while you are taking your  medicine.  You have any problems that may be related to the medicine you are taking. SEEK IMMEDIATE MEDICAL CARE IF:   You have a fever.  You develop chills.  You develop severe bleeding or start to pass blood clots.  You feel dizzy or faint. MAKE SURE YOU:   Understand these instructions.  Will watch your condition.  Will get help right away if you are not doing well or get worse. Document Released: 08/25/2005 Document Revised: 11/17/2011 Document Reviewed: 04/14/2008 [facility] Patient Information 2013 [place], [place].  Levonorgestrel intrauterine device (IUD) What is this medicine? LEVONORGESTREL IUD (LEE voe nor jes trel) is a contraceptive (birth control) device. The device is placed inside the uterus by a healthcare professional. It is used to prevent pregnancy and can also be used to treat heavy bleeding that occurs during your period. Depending on the device, it can be used for 3 to 5 years. This medicine may be used for other purposes; ask your health care provider or pharmacist if you have questions. What should I tell my health care provider before I take this medicine? They need to know if you have any of these conditions: -abnormal Pap smear -cancer of the breast, uterus, or cervix -diabetes -endometritis -genital or pelvic infection now or in the past -have more than one sexual partner or your partner has more than one partner -heart disease -history of an ectopic or tubal pregnancy -immune system problems -IUD in place -liver disease or tumor -problems with blood clots or take blood-thinners -use intravenous drugs -uterus of unusual shape -vaginal bleeding  that has not been explained -an unusual or allergic reaction to levonorgestrel, other hormones, silicone, or polyethylene, medicines, foods, dyes, or preservatives -pregnant or trying to get pregnant -breast-feeding How should I use this medicine? This device is placed inside the uterus by a health care  professional. Talk to your pediatrician regarding the use of this medicine in children. Special care may be needed. Overdosage: If you think you have taken too much of this medicine contact a poison control center or emergency room at once. NOTE: This medicine is only for you. Do not share this medicine with others. What if I miss a dose? This does not apply. What may interact with this medicine? Do not take this medicine with any of the following medications: -amprenavir -bosentan -fosamprenavir This medicine may also interact with the following medications: -aprepitant -barbiturate medicines for inducing sleep or treating seizures -bexarotene -griseofulvin -medicines to treat seizures like carbamazepine, ethotoin, felbamate, oxcarbazepine, phenytoin, topiramate -modafinil -pioglitazone -rifabutin -rifampin -rifapentine -some medicines to treat HIV infection like atazanavir, indinavir, lopinavir, nelfinavir, tipranavir, ritonavir -St. John's wort -warfarin This list may not describe all possible interactions. Give your health care provider a list of all the medicines, herbs, non-prescription drugs, or dietary supplements you use. Also tell them if you smoke, drink alcohol, or use illegal drugs. Some items may interact with your medicine. What should I watch for while using this medicine? Visit your doctor or health care professional for regular check ups. See your doctor if you or your partner has sexual contact with others, becomes HIV positive, or gets a sexual transmitted disease. This product does not protect you against HIV infection (AIDS) or other sexually transmitted diseases. You can check the placement of the IUD yourself by reaching up to the top of your vagina with clean fingers to feel the threads. Do not pull on the threads. It is a good habit to check placement after each menstrual period. Call your doctor right away if you feel more of the IUD than just the threads or if  you cannot feel the threads at all. The IUD may come out by itself. You may become pregnant if the device comes out. If you notice that the IUD has come out use a backup birth control method like condoms and call your health care provider. Using tampons will not change the position of the IUD and are okay to use during your period. What side effects may I notice from receiving this medicine? Side effects that you should report to your doctor or health care professional as soon as possible: -allergic reactions like skin rash, itching or hives, swelling of the face, lips, or tongue -fever, flu-like symptoms -genital sores -high blood pressure -no menstrual period for 6 weeks during use -pain, swelling, warmth in the leg -pelvic pain or tenderness -severe or sudden headache -signs of pregnancy -stomach cramping -sudden shortness of breath -trouble with balance, talking, or walking -unusual vaginal bleeding, discharge -yellowing of the eyes or skin Side effects that usually do not require medical attention (report to your doctor or health care professional if they continue or are bothersome): -acne -breast pain -change in sex drive or performance -changes in weight -cramping, dizziness, or faintness while the device is being inserted -headache -irregular menstrual bleeding within first 3 to 6 months of use -nausea This list may not describe all possible side effects. Call your doctor for medical advice about side effects. You may report side effects to FDA at 1-800-FDA-1088. Where should I keep my  medicine? This does not apply. NOTE: This sheet is a summary. It may not cover all possible information. If you have questions about this medicine, talk to your doctor, pharmacist, or health care provider.  2013, [employer]vier/[employer] Standard. (09/25/2011 1:54:04 PM)

## 2012-09-15 NOTE — Assessment & Plan Note (Signed)
Provera until next visit and then IUD

## 2012-09-15 NOTE — Assessment & Plan Note (Signed)
Will calibrate home BP cuff next visit and add medication if needed.

## 2012-09-15 NOTE — Progress Notes (Signed)
  Subjective:    Patient ID: Summer Brewer, female    DOB: Dec 01, 1970, 42 y.o.   MRN: [id number]  HPI 42 y.o. female with heavy vaginal bleeding. Seen 11/11, restarted Depo shots as these had worked before to control her bleeding. Bleeding stopped after Depo shot but restarted 12/6. Has been bleeding every day since then. Some days are lighter than others. Still having occasion clots size of half-dollar.  Bleeding today is not very heavy. Pelvic/transvaginal ultrasound on 11/15 showed normal uterus and adnexa with normal endometrial stripe. Still having dysmenorrhea on heavy bleeding days. Ibuprofen and naproxen don't help much. Would like to try Mirena IUD.  Hypertension:  Today BP 141/95. Got BP cuff for Christmas, wrist cuff. States BPs are 170s-180s/90s.  Checks in AM and when having symptoms (headache, seeing spots).  Increased lisinopril to 40 and HCTZ to 25 last visit. No chest pain or neurological symptoms.  Review of Systems  Constitutional: Positive for fatigue. Negative for fever and chills.  HENT: Negative for neck pain and neck stiffness.   Eyes: Positive for visual disturbance.  Respiratory: Negative for chest tightness and shortness of breath.   Cardiovascular: Negative for chest pain and palpitations.  Gastrointestinal: Negative for nausea and vomiting.  Genitourinary: Negative for dysuria.  Neurological: Positive for headaches. Negative for dizziness, speech difficulty, weakness and light-headedness.      Objective:   Physical Exam  Constitutional: She is oriented to person, place, and time. She appears well-developed and well-nourished. No distress.  HENT:  Head: Normocephalic and atraumatic.  Eyes: Conjunctivae normal and EOM are normal.  Neck: Normal range of motion. Neck supple.  Cardiovascular: Normal rate, regular rhythm and normal heart sounds.   Pulmonary/Chest: Effort normal and breath sounds normal. No respiratory distress.  Abdominal: Soft. There is no  tenderness. There is no rebound and no guarding.  Musculoskeletal: Normal range of motion. She exhibits no edema and no tenderness.  Neurological: She is alert and oriented to person, place, and time.  Skin: Skin is warm and dry.  Psychiatric: She has a normal mood and affect.   Filed Vitals:   09/15/12 1340  BP: 141/95  Pulse: 94  Temp: 98.6 F (37 C)      Assessment & Plan:  Menorrhagia/Dysmenorrhea Hypetension  F/U one week for Mirena if not bleeding.

## 2012-09-20 ENCOUNTER — Ambulatory Visit: Payer: Medicaid Other | Admitting: Family Medicine

## 2012-09-23 ENCOUNTER — Telehealth: Payer: Self-pay | Admitting: Family Medicine

## 2012-09-23 NOTE — Telephone Encounter (Signed)
Patient would like to speak to Dr. Pollie Meyer about the pain from cramping.  She was supposed to be seen on Monday, but her daughter was in the hospital.  MedroxyProgesterone was prescribed to stop the bleeding but isn't working and she would like to know if there is a higher dose that she can take until she can be seen.

## 2012-09-24 NOTE — Telephone Encounter (Signed)
Pt has appt 10/01/12 with PCP .Arlyss Repress

## 2012-09-24 NOTE — Telephone Encounter (Signed)
Attempted x2 to call patient back. No answer, and did not leave VM as it was a generic voicemail answer and I have never actually met or spoken with this patient (saw Dr. Thad Ranger for menorrhagia).   If she calls back, important history to obtain includes: -how much is she bleeding? -any dizziness or lightheadedness?  If she is symptomatic with blood loss anemia she should be seen sooner for a SDA or if very symptomatic, go to urgent care or ER.   It is now past 5 on Friday, will attempt to call again on Monday.

## 2012-09-27 NOTE — Telephone Encounter (Signed)
Called and spoke with patient. Over the weekend her bleeding got lighter. No dizziness or lightheadedness.   She has an appt on Friday at which time we can talk about her bleeding in greater detail.

## 2012-10-01 ENCOUNTER — Ambulatory Visit: Payer: Medicaid Other | Admitting: Family Medicine

## 2012-10-01 ENCOUNTER — Other Ambulatory Visit: Payer: Self-pay | Admitting: *Deleted

## 2012-10-04 ENCOUNTER — Ambulatory Visit: Payer: Medicaid Other

## 2012-10-14 ENCOUNTER — Ambulatory Visit (INDEPENDENT_AMBULATORY_CARE_PROVIDER_SITE_OTHER): Payer: Medicaid Other | Admitting: *Deleted

## 2012-10-14 DIAGNOSIS — N920 Excessive and frequent menstruation with regular cycle: Secondary | ICD-10-CM

## 2012-10-14 MED ORDER — MEDROXYPROGESTERONE ACETATE 150 MG/ML IM SUSP
150.0000 mg | Freq: Once | INTRAMUSCULAR | Status: AC
  Administered 2012-10-14: 150 mg via INTRAMUSCULAR

## 2012-10-14 NOTE — Progress Notes (Signed)
Next Depo due April 24 through Jan 13, 2013, 2014.  Patient states she continues to have some bleeding which she describes as spotting.

## 2012-10-25 ENCOUNTER — Encounter: Payer: Self-pay | Admitting: Family Medicine

## 2012-10-25 NOTE — Progress Notes (Signed)
Patient ID: Summer Brewer, female   DOB: [birth year], 42 y.o.   MRN: [id number]  This patient no-showed to her last appointment to discuss contraception for menorrhagia. My impression was that she wanted to get an IUD. She should make an appointment to see MD before future depo shots should be given.  Routing to [facility] nurse Larita Fife & documenting in chart.

## 2012-10-27 NOTE — Progress Notes (Signed)
Message left to return call.

## 2012-10-28 NOTE — Progress Notes (Signed)
Spoke directly with patient and gave her message from Dr. Pollie Meyer.

## 2012-10-28 NOTE — Progress Notes (Signed)
Message again left to return call. Message left on voicemail that she" will need appointment regarding reason she was here for last Nurse visit  before future given ".

## 2012-10-28 NOTE — Progress Notes (Signed)
See documentation by Dr. Pollie Meyer on 02/17. Patient will need appointment to see her before future Depo Provera is given. Message left on patient's voicemail to contact our office.

## 2013-01-03 ENCOUNTER — Ambulatory Visit (INDEPENDENT_AMBULATORY_CARE_PROVIDER_SITE_OTHER): Payer: Self-pay | Admitting: Family Medicine

## 2013-01-03 ENCOUNTER — Encounter: Payer: Self-pay | Admitting: Family Medicine

## 2013-01-03 VITALS — BP 174/102 | HR 90 | Ht 66.5 in | Wt 240.5 lb

## 2013-01-03 DIAGNOSIS — I10 Essential (primary) hypertension: Secondary | ICD-10-CM

## 2013-01-03 DIAGNOSIS — R21 Rash and other nonspecific skin eruption: Secondary | ICD-10-CM

## 2013-01-03 MED ORDER — HYDROXYZINE HCL 10 MG PO TABS: 10.0000 mg | ORAL_TABLET | Freq: Three times a day (TID) | ORAL | Status: DC | PRN

## 2013-01-03 MED ORDER — LISINOPRIL-HYDROCHLOROTHIAZIDE 20-12.5 MG PO TABS: 1.0000 | ORAL_TABLET | Freq: Every day | ORAL | Status: DC

## 2013-01-03 MED ORDER — TRIAMCINOLONE ACETONIDE 0.5 % EX OINT: TOPICAL_OINTMENT | Freq: Two times a day (BID) | CUTANEOUS | Status: DC

## 2013-01-03 MED ORDER — PERMETHRIN 5 % EX CREA: TOPICAL_CREAM | Freq: Once | CUTANEOUS | Status: DC

## 2013-01-03 NOTE — Patient Instructions (Addendum)
Use the Permethrin everywhere below your neck tonight.  Wash it off in the morning.    Take the Atarax when you need it for itching.  It can make you sleepy, be careful driving with them.    Take the Triamcinolone ointment twice daily for the next 2 weeks.    Refill for blood pressure medicines.    You need to return for blood pressure recheck in 2-3 weeks.

## 2013-01-03 NOTE — Progress Notes (Signed)
Subjective:    Summer Brewer is a 42 y.o. female who presents to [facility] today with complaints of Rash:  1.  Rash:  Patient has history of eczema which usually occurs usually across the flexural surfaces of the elbows. However patient states that she had a new pruritic erythematous rash which started to 3 days ago and "is not like my normal eczema." She has had trouble sleeping at night secondary to the itching. Rash spread up both her arms and across her upper chest. No close contacts with any similar symptoms. She has tried her daughter's hydrocortisone for relief but was unable to obtain any. No fevers or chills.  2.  Hypertension:  Noted on BP check today.  Has been out of her BP meds for over a month due to an insurance issue.  Long-term problem for this patient.  No adverse effects from medication.  Not checking it regularly at home.  No HA, CP, dizziness, shortness of breath, palpitations, or LE swelling.   BP Readings from Last 3 Encounters:  01/03/13 174/102  09/15/12 141/95  07/19/12 158/102     The following portions of the patient's history were reviewed and updated as appropriate: allergies, current medications, past medical history, family and social history, and problem list. Patient is a nonsmoker.    PMH reviewed.  Past Medical History  Diagnosis Date  . Back pain   . Hypertension   . GERD (gastroesophageal reflux disease)    Past Surgical History  Procedure Laterality Date  . Tubal ligation      Medications reviewed. Current Outpatient Prescriptions  Medication Sig Dispense Refill  . cyclobenzaprine (FLEXERIL) 5 MG tablet Take 2 tablets (10 mg total) by mouth 3 (three) times daily as needed. For muscle spasm  90 tablet  1  . hydrochlorothiazide (HYDRODIURIL) 25 MG tablet Take 1 tablet (25 mg total) by mouth daily.  30 tablet  1  . lisinopril (PRINIVIL,ZESTRIL) 40 MG tablet Take 1 tablet (40 mg total) by mouth daily.  30 tablet  1  . lisinopril-hydrochlorothiazide  (PRINZIDE,ZESTORETIC) 20-12.5 MG per tablet Take 1 tablet by mouth daily.  30 tablet  2  . medroxyPROGESTERone (PROVERA) 10 MG tablet Take 1 tablet (10 mg total) by mouth daily. Use for ten days  10 tablet  1  . metroNIDAZOLE (METROGEL) 0.75 % vaginal gel Place vaginally at bedtime. Use for 5 days then stop  70 g  0  . nystatin (MYCOSTATIN) powder Apply topically 4 (four) times daily.  60 g  5   No current facility-administered medications for this visit.    ROS as above otherwise neg.  No chest pain, palpitations, SOB, Fever, Chills, Abd pain, N/V/D.   Objective:   Physical Exam BP 174/102  Pulse 90  Ht 5' 6.5" (1.689 m)  Wt 240 lb 8 oz (109.09 kg)  BMI 38.24 kg/m2  LMP 12/13/2012 Gen:  Alert, cooperative patient who appears stated age in no acute distress.  Vital signs reviewed. HEENT: EOMI,  MMM Cardiac:  Regular rate and rhythm without murmur auscultated.  Good S1/S2. Pulm:  Clear to auscultation bilaterally with good air movement.  No wheezes or rales noted.   Skin:  Erythematous maculopapular rash which starts just distal to elbows and bilaterally extends proximally across the upper shoulders.  Excoriations noted.  Questionable burrows versus excoriations observed posterior portions of arms.   Skin exam otherwise negative. Exts: Non edematous BL  LE, warm and well perfused.   No results found for this  or any previous visit (from the past 72 hour(s)).

## 2013-01-04 DIAGNOSIS — R21 Rash and other nonspecific skin eruption: Secondary | ICD-10-CM | POA: Insufficient documentation

## 2013-01-04 NOTE — Assessment & Plan Note (Signed)
Questionable scabies, though less likely.  Looks more like some form of contact dermatitis. Plan to treat 1 time course of Permethrin. Atarax to help for relief of pruritis. Triamcinolone for topical relief AFTER using permethrin tonight and washing off in the AM. FU in 10 - 14 days if no improvement, sooner if worsening.

## 2013-01-04 NOTE — Assessment & Plan Note (Signed)
Refill for meds provided.   REcheck in 2-3 weeks to ensure improvement.

## 2013-01-24 ENCOUNTER — Ambulatory Visit (INDEPENDENT_AMBULATORY_CARE_PROVIDER_SITE_OTHER): Payer: Medicaid Other | Admitting: Family Medicine

## 2013-01-24 ENCOUNTER — Encounter: Payer: Self-pay | Admitting: Family Medicine

## 2013-01-24 VITALS — BP 172/96 | HR 84 | Temp 99.4°F | Ht 66.5 in | Wt 238.0 lb

## 2013-01-24 DIAGNOSIS — I10 Essential (primary) hypertension: Secondary | ICD-10-CM

## 2013-01-24 DIAGNOSIS — N920 Excessive and frequent menstruation with regular cycle: Secondary | ICD-10-CM

## 2013-01-24 DIAGNOSIS — Z309 Encounter for contraceptive management, unspecified: Secondary | ICD-10-CM

## 2013-01-24 DIAGNOSIS — L259 Unspecified contact dermatitis, unspecified cause: Secondary | ICD-10-CM

## 2013-01-24 DIAGNOSIS — L309 Dermatitis, unspecified: Secondary | ICD-10-CM

## 2013-01-24 DIAGNOSIS — N946 Dysmenorrhea, unspecified: Secondary | ICD-10-CM

## 2013-01-24 LAB — POCT URINE PREGNANCY: Preg Test, Ur: NEGATIVE

## 2013-01-24 MED ORDER — CLOBETASOL PROPIONATE 0.05 % EX CREA: TOPICAL_CREAM | Freq: Two times a day (BID) | CUTANEOUS | Status: DC

## 2013-01-24 MED ORDER — MEDROXYPROGESTERONE ACETATE 150 MG/ML IM SUSP
150.0000 mg | Freq: Once | INTRAMUSCULAR | Status: AC
  Administered 2013-01-24: 150 mg via INTRAMUSCULAR

## 2013-01-24 NOTE — Progress Notes (Signed)
Name: Summer Brewer Age/Sex: 42 y.o. female  HPI:  Hypertension: Pt reports she checks her BP at home and gets 170s-180s/90s. Denies shortness of breath, chest pain, swelling in her legs, headache, vision changes, or palpitations. She is currently taking lisinopril-HCTZ.  Eczema: has been using triamcinolone ointment but states it has not controlled her eczema. Would like to know what else she can try.  Heavy period bleeding: Has been using depo for bleeding but had considered getting a Mirena IUD as she was dissatisfied with depo and had continued bleeding. Had an appointment scheduled to have an IUD put in, but pt no-showed to this visit because she thought her bleeding was too heavy to have it put in. Reports that she has been bleeding since March. Has heavy bleeding with clots, which intermittently stops. Pt says she has had an endometrial biopsy done before, but there is no record of this in Epic that I can find.   ROS: See HPI  PMFSH: Reports family hx of blood clots (in her mother and grandmother). Pt denies smoking. Only allergy is to latex.  PHYSICAL EXAM: BP 172/96  Pulse 84  Temp(Src) 99.4 F (37.4 C) (Oral)  Ht 5' 6.5" (1.689 m)  Wt 238 lb (107.956 kg)  BMI 37.84 kg/m2  LMP 12/13/2012 Gen: no acute distress. Well developed, well nourished. HEENT: normocephalic, atraumatic Heart: RRR Lungs: CTAB, NWOB Abd: soft, nontender to palpation Neuro: grossly nonfocal, speech intact. Ext: calves nontender to palpation Skin: ecztematous patches present on arms. Superficial small scrapes present from scratching. No signs of superinfection.

## 2013-01-24 NOTE — Patient Instructions (Signed)
I have sent in a prescription for a stronger eczema cream. Only use it for 10 days, as it can cause lightening or thinning of the skin.  Call to set up a lab appointment to get your bloodwork checked. Once I have the bloodwork, I can increase your blood pressure medicine.  I recommend you get an endometrial biopsy. This can be done at our [facility] here at the [facility]. Call to schedule that appointment.  Please schedule a follow up visit in 2 weeks so we can recheck your blood pressure.  Dr. Pollie Meyer

## 2013-01-25 ENCOUNTER — Telehealth: Payer: Self-pay | Admitting: Family Medicine

## 2013-01-25 NOTE — Telephone Encounter (Signed)
Patient is calling with a complaint about the length of the wait time for her children and herself for their appointments.  The problem with this lengthy appointments has now cost her her job.  During the appointments, the doctor leaves the room a lot and with one of the children who has a problem with bleeding, has been referred to [facility], and she doesn't appreciate that.

## 2013-01-26 NOTE — Telephone Encounter (Signed)
Returned call to patient.  Explained that I am working in the clinic this afternoon and will call her back tomorrow morning at 9:00 am.  Patient agreeable and verbalized understanding.  Gaylene Brooks, RN

## 2013-01-26 NOTE — Telephone Encounter (Signed)
Pt called back today. She wanted to know why it was taking so long for her to be called back about "this very important matter.

## 2013-01-27 NOTE — Telephone Encounter (Signed)
Returned call to patient.  Patient and daughter had office visit on 01/24/13 with Dr. Pollie Meyer.  Discussed patient's concerns about "long wait times."    Will route note and follow-up with Dr. Pollie Meyer per protocol.  Note also routed to Drs. Gwendolyn Grant and Pascal Lux.  Gaylene Brooks, RN

## 2013-01-28 DIAGNOSIS — L309 Dermatitis, unspecified: Secondary | ICD-10-CM | POA: Insufficient documentation

## 2013-01-28 NOTE — Assessment & Plan Note (Addendum)
Pt reporting continued heavy bleeding on depo. Pt is unsure which route she would like to take as far as contraception in the future. I cannot find any record of her having had an endometrial biopsy in epic, which I think should be considered in light of her persistent bleeding and age >70. Offered to patient that she could schedule an appointment with the [facility] here at [facility] to have this done, but she stated she did not want to have it done here. Unable to have labs drawn today as visit ended after 5. Recommended pt return for lab appointment. Will check CBC, coags, and TSH. Urine pregnancy negative here in office today (and patient is s/p BTL), so will give depo today per pt's request.

## 2013-01-28 NOTE — Assessment & Plan Note (Signed)
Not alleviated by triamcinolone ointment. Will rx clobetasol for higher potency. Advised patient to use this sparingly due to risks of hypopigmentation and thinning of the skin.

## 2013-01-28 NOTE — Assessment & Plan Note (Addendum)
BP not well controlled on current regimen, neither on our check or per patient's report of home BP readings. Discussed with pt options to increase current medicine (lisinopril-HCTZ) or to add another agent. Pt would like to increase her current medicine. Prior to increase I would like to get labs on pt to check renal function, as she has not had any drawn in one year, and at that point her GFR was slightly decreased. Unfortunately the visit ended after 5pm so pt will need to return to have labs drawn. Instructed her to return to get labs drawn at her convenience, after which I will send in rx for increased BP medicine. Would like pt to f/u in 2 weeks to recheck her BP. Precepted encounter with Dr. Mauricio Po, who agrees with this plan.  Note: pt expressed extreme dissatisfaction with our clinic today, and said she plans on transferring her care and her children's care to another clinic. Complained of long wait times and feeling as if our clinic does not provide good care for her and her children. She said she was not sure if she will be returning to have labs drawn. Will await lab results, and when they return, send in rx for increase dose of BP meds. Patient left the clinic as I was typing up her instructions, so she did not receive her AVS. I attempted to find her in the waiting room to give her her paperwork, but she had already left the building.

## 2013-02-01 ENCOUNTER — Encounter: Payer: Self-pay | Admitting: Family Medicine

## 2013-02-01 ENCOUNTER — Other Ambulatory Visit: Payer: Medicaid Other

## 2013-02-04 ENCOUNTER — Encounter: Payer: Self-pay | Admitting: *Deleted

## 2013-02-04 NOTE — Telephone Encounter (Signed)
Called patient and left message to call me back.  Gaylene Brooks, RN

## 2013-02-04 NOTE — Telephone Encounter (Signed)
This encounter was created in error - please disregard.

## 2013-02-14 NOTE — Telephone Encounter (Signed)
Called patient and left message to call our office back.  Verneta Hamidi Ann, RN  

## 2013-03-31 ENCOUNTER — Encounter: Payer: Self-pay | Admitting: Family Medicine

## 2013-03-31 ENCOUNTER — Ambulatory Visit (INDEPENDENT_AMBULATORY_CARE_PROVIDER_SITE_OTHER): Payer: Medicaid Other | Admitting: Family Medicine

## 2013-03-31 ENCOUNTER — Telehealth: Payer: Self-pay | Admitting: *Deleted

## 2013-03-31 VITALS — BP 158/104 | HR 92 | Temp 98.2°F | Ht 66.5 in | Wt 233.3 lb

## 2013-03-31 DIAGNOSIS — N946 Dysmenorrhea, unspecified: Secondary | ICD-10-CM

## 2013-03-31 DIAGNOSIS — N920 Excessive and frequent menstruation with regular cycle: Secondary | ICD-10-CM

## 2013-03-31 DIAGNOSIS — R300 Dysuria: Secondary | ICD-10-CM

## 2013-03-31 DIAGNOSIS — Z309 Encounter for contraceptive management, unspecified: Secondary | ICD-10-CM

## 2013-03-31 DIAGNOSIS — N390 Urinary tract infection, site not specified: Secondary | ICD-10-CM

## 2013-03-31 LAB — POCT URINALYSIS DIPSTICK
Glucose, UA: NEGATIVE
Nitrite, UA: POSITIVE
Protein, UA: 100
Urobilinogen, UA: 1

## 2013-03-31 LAB — POCT URINE PREGNANCY: Preg Test, Ur: NEGATIVE

## 2013-03-31 LAB — POCT UA - MICROSCOPIC ONLY

## 2013-03-31 MED ORDER — CEPHALEXIN 500 MG PO CAPS: 500.0000 mg | ORAL_CAPSULE | Freq: Three times a day (TID) | ORAL | Status: DC

## 2013-03-31 MED ORDER — LEVONORGESTREL 20 MCG/24HR IU IUD
INTRAUTERINE_SYSTEM | Freq: Once | INTRAUTERINE | Status: AC
  Administered 2013-03-31: 11:00:00 via INTRAUTERINE

## 2013-03-31 MED ORDER — CEPHALEXIN 500 MG PO CAPS: 500.0000 mg | ORAL_CAPSULE | Freq: Four times a day (QID) | ORAL | Status: DC

## 2013-03-31 NOTE — Progress Notes (Signed)
Patient ID: Summer Brewer, female   DOB: 1971-03-24, 42 y.o.   MRN: [id number] Patient desires Mirena IUD in hopes of controlling menorrhagia. Has discussed with Dr. Thad Ranger (pevious PCP) and Dr/ Pollie Meyer (current PCP).  IUD INSERTION: Patient given informed consent, signed copy in the chart..  Patient has had bilateral tubal ligation.Marland Kitchen  Appropriate time out taken.   Sterile instruments and technique was used. Cervix brought into view with use of speculum and then cleansed three times with  betadine swabs.  A tenaculum was placed into the anterior lip of the cervix and a uterine sound was used to measure uterine size.   A Mirena IUD was placed into the endometrial cavity, deployed and secured. The applicator was removed. The strings were trimmed to 2 centimeters.   There were no complications and the patient tolerated the procedure well.   She was given handouts for post procedure instructions and information about the IUD including a card with the time of recommended removal.  Also c/o dysuria 2-3 days. No abdominalpain or fever. Will culture urine and treat w 7 days keflex

## 2013-03-31 NOTE — Telephone Encounter (Signed)
Spoke with patient and informed her that RX has been sent in for her UTI

## 2013-04-01 ENCOUNTER — Telehealth: Payer: Self-pay | Admitting: Family Medicine

## 2013-04-01 NOTE — Telephone Encounter (Signed)
[facility] is calling because Yuval came to pick up her medication and it was not received by the Pharmacy. They would like [place] to re-send this to them. JW

## 2013-04-01 NOTE — Telephone Encounter (Signed)
Called in Keflex 500 mg one pill four times a day disp #30 with 0 refills.  Hollis Tuller, Darlyne Russian, CMA

## 2013-05-04 ENCOUNTER — Other Ambulatory Visit: Payer: Self-pay | Admitting: Family Medicine

## 2013-05-04 NOTE — Telephone Encounter (Signed)
Received refill request for metronidazole gel. Pt needs to be seen if she is having vaginal discharge, so will not refill. [facility] red team, please call patient and inform.  Thanks, Latrelle Dodrill, MD

## 2013-05-04 NOTE — Telephone Encounter (Signed)
Called pt and she states that she is continuing to have d/c appointment made for tomorrow Wyatt Haste, RN-BSN

## 2013-05-04 NOTE — Telephone Encounter (Signed)
Pt is requesting a refill be sent to her pharmacy for metronidazole. Use the [facility]. JW

## 2013-05-05 ENCOUNTER — Telehealth: Payer: Self-pay | Admitting: *Deleted

## 2013-05-05 ENCOUNTER — Ambulatory Visit (INDEPENDENT_AMBULATORY_CARE_PROVIDER_SITE_OTHER): Payer: Medicaid Other | Admitting: Family Medicine

## 2013-05-05 ENCOUNTER — Encounter: Payer: Self-pay | Admitting: Family Medicine

## 2013-05-05 VITALS — BP 172/98 | HR 102 | Temp 98.2°F | Ht 66.5 in | Wt 235.0 lb

## 2013-05-05 DIAGNOSIS — B9689 Other specified bacterial agents as the cause of diseases classified elsewhere: Secondary | ICD-10-CM

## 2013-05-05 DIAGNOSIS — N760 Acute vaginitis: Secondary | ICD-10-CM

## 2013-05-05 DIAGNOSIS — A499 Bacterial infection, unspecified: Secondary | ICD-10-CM

## 2013-05-05 DIAGNOSIS — N898 Other specified noninflammatory disorders of vagina: Secondary | ICD-10-CM

## 2013-05-05 LAB — POCT WET PREP (WET MOUNT): Clue Cells Wet Prep Whiff POC: POSITIVE

## 2013-05-05 MED ORDER — METRONIDAZOLE 0.75 % VA GEL: Freq: Every day | VAGINAL | Status: DC

## 2013-05-05 NOTE — Telephone Encounter (Signed)
Patient called back to check on status of her daughter's refill.  Informed mother that Rx was refilled on 04/28/13.  Discussed patient's concern from call on 01/25/13.  Patient states she prefers to have Dr. Pollie Meyer continue to be her PCP.  Gaylene Brooks, RN

## 2013-05-05 NOTE — Patient Instructions (Addendum)
Bacterial Vaginosis  Bacterial vaginosis is an infection of the vagina. A healthy vagina has many kinds of good germs (bacteria). Sometimes the number of good germs can change. This allows bad germs to move in and cause an infection. You may be given medicine (antibiotics) to treat the infection. Or, you may not need treatment at all.  HOME CARE   Take your medicine as told. Finish them even if you start to feel better.   Do not have sex until you finish your medicine.   Do not douche.   Practice safe sex.   Tell your sex partner that you have an infection. They should see their doctor for treatment if they have problems.  GET HELP RIGHT AWAY IF:   You do not get better after 3 days of treatment.   You have grey fluid (discharge) coming from your vagina.   You have pain.   You have a temperature of 102 F (38.9 C) or higher.  MAKE SURE YOU:    Understand these instructions.   Will watch your condition.   Will get help right away if you are not doing well or get worse.  Document Released: 06/03/2008 Document Revised: 11/17/2011 Document Reviewed: 06/03/2008  [employer] Patient Information 2014 [employer].

## 2013-05-05 NOTE — Progress Notes (Signed)
Patient ID: Summer Brewer, female   DOB: 1971/01/05, 42 y.o.   MRN: [id number]  [facility] Bridney Guadarrama M. Candice Tobey, MD Phone: [phone]   Subjective: HPI: Patient is a 42 y.o. female presenting to clinic today for same day appointment for vaginal discharge.  Discharge started 1 week ago. Malodorous clear thin discharge. Still having some pink discharge from IUD placement 1 month ago. Did have some heavy bleeding right after the IUD placement but that resolved. Had BV more than 1 year ago, no discharge after treatment. No new sex partners. No pain with sex, no dysuria. Partner recently check.   History Reviewed: Non-smoker.  ROS: Please see HPI above.  Objective: Office vital signs reviewed. BP 172/98  Pulse 102  Temp(Src) 98.2 F (36.8 C) (Oral)  Ht 5' 6.5" (1.689 m)  Wt 235 lb (106.595 kg)  BMI 37.37 kg/m2  Physical Examination:  General: Awake, alert. NAD. HEENT: Atraumatic, normocephalic. MMM. Pulm: CTAB, no wheezes Cardio: RRR, no murmurs appreciated Abdomen: soft, nontender, nondistended GU: external genitalia wnl. Bloody, thick, malodorous discharge in vault. No CMT. Extremities: No edema Neuro: Grossly intact  Assessment: 42 y.o. female with vaginitis  Plan: See Problem List and After Visit Summary

## 2013-05-05 NOTE — Telephone Encounter (Signed)
Mom was in clinic today and asked that we complete a form for school for pt.  Will place in MDs box. Summer Brewer, [name]

## 2013-05-06 ENCOUNTER — Telehealth: Payer: Self-pay | Admitting: Family Medicine

## 2013-05-06 MED ORDER — METRONIDAZOLE 500 MG PO TABS: 500.0000 mg | ORAL_TABLET | Freq: Two times a day (BID) | ORAL | Status: DC

## 2013-05-06 NOTE — Telephone Encounter (Signed)
Form completed, see note in patient's daughter's chart.

## 2013-05-06 NOTE — Assessment & Plan Note (Signed)
Vaginal discharge with clue cells on wet prep. Will treat with metrogel at pt request.  Addendum: Metrogel not covered by insurance. Change to Flagyl PO. Faxed to pharmacy.

## 2013-05-06 NOTE — Telephone Encounter (Signed)
Patient is calling because the medication she was prescribed yesterday, Metrogel, Medicaid doesn't pay for, so she needs something else.  She is frustrated, not at [facility], at her symptoms and at the fact that she has been trying to get her medication for a week now and Medicaid used to pay for this medication.  She would appreciate if Dr. Mikel Cella could get this fixed by lunch please.

## 2013-05-06 NOTE — Telephone Encounter (Signed)
Medicaid will cover the Flagyl pill. This has been sent to the pharmacy. She should take it twice daily for 7 days, and avoid alcohol while she is on the medication. Please apologize for this. I understand they used to cover the gel but their coverage changes every year. I would recommend she take the pill to treat her symptoms.  Thanks! Yasemin Rabon M. Candee Hoon, M.D.

## 2013-05-06 NOTE — Telephone Encounter (Signed)
Called patient and informed of med change.  Gaylene Brooks, RN

## 2013-08-28 ENCOUNTER — Emergency Department (HOSPITAL_BASED_OUTPATIENT_CLINIC_OR_DEPARTMENT_OTHER)
Admission: EM | Admit: 2013-08-28 | Discharge: 2013-08-28 | Disposition: A | Discharge status: Home / Self Care | Payer: Medicaid Other | Attending: Emergency Medicine | Admitting: Emergency Medicine

## 2013-08-28 ENCOUNTER — Encounter (HOSPITAL_BASED_OUTPATIENT_CLINIC_OR_DEPARTMENT_OTHER): Payer: Self-pay | Admitting: Emergency Medicine

## 2013-08-28 DIAGNOSIS — Z8739 Personal history of other diseases of the musculoskeletal system and connective tissue: Secondary | ICD-10-CM | POA: Insufficient documentation

## 2013-08-28 DIAGNOSIS — N390 Urinary tract infection, site not specified: Secondary | ICD-10-CM | POA: Insufficient documentation

## 2013-08-28 DIAGNOSIS — Z8719 Personal history of other diseases of the digestive system: Secondary | ICD-10-CM | POA: Insufficient documentation

## 2013-08-28 DIAGNOSIS — Z3202 Encounter for pregnancy test, result negative: Secondary | ICD-10-CM | POA: Insufficient documentation

## 2013-08-28 DIAGNOSIS — N898 Other specified noninflammatory disorders of vagina: Secondary | ICD-10-CM | POA: Insufficient documentation

## 2013-08-28 DIAGNOSIS — Z79899 Other long term (current) drug therapy: Secondary | ICD-10-CM | POA: Insufficient documentation

## 2013-08-28 DIAGNOSIS — R51 Headache: Secondary | ICD-10-CM | POA: Insufficient documentation

## 2013-08-28 DIAGNOSIS — I10 Essential (primary) hypertension: Secondary | ICD-10-CM | POA: Insufficient documentation

## 2013-08-28 DIAGNOSIS — Z792 Long term (current) use of antibiotics: Secondary | ICD-10-CM | POA: Insufficient documentation

## 2013-08-28 DIAGNOSIS — Z872 Personal history of diseases of the skin and subcutaneous tissue: Secondary | ICD-10-CM | POA: Insufficient documentation

## 2013-08-28 DIAGNOSIS — Z91040 Latex allergy status: Secondary | ICD-10-CM | POA: Insufficient documentation

## 2013-08-28 HISTORY — DX: Dermatitis, unspecified: L30.9

## 2013-08-28 LAB — URINALYSIS, ROUTINE W REFLEX MICROSCOPIC
Bilirubin Urine: NEGATIVE
Ketones, ur: NEGATIVE mg/dL
Nitrite: POSITIVE — AB
Protein, ur: 100 mg/dL — AB
Urobilinogen, UA: 1 mg/dL (ref 0.0–1.0)

## 2013-08-28 LAB — URINE MICROSCOPIC-ADD ON

## 2013-08-28 MED ORDER — FLUCONAZOLE 100 MG PO TABS
200.0000 mg | ORAL_TABLET | Freq: Once | ORAL | Status: AC
  Administered 2013-08-28: 200 mg via ORAL
  Filled 2013-08-28: qty 2

## 2013-08-28 MED ORDER — CIPROFLOXACIN HCL 500 MG PO TABS: 500.0000 mg | ORAL_TABLET | Freq: Two times a day (BID) | ORAL | Status: DC

## 2013-08-28 MED ORDER — KETOROLAC TROMETHAMINE 60 MG/2ML IM SOLN
60.0000 mg | Freq: Once | INTRAMUSCULAR | Status: AC
  Administered 2013-08-28: 60 mg via INTRAMUSCULAR
  Filled 2013-08-28: qty 2

## 2013-08-28 NOTE — ED Provider Notes (Signed)
CSN: [id number]     Arrival date & time 08/28/13  0806 History   First MD Initiated Contact with Patient 08/28/13 [phone]     Chief Complaint  Patient presents with  . Dysuria   (Consider location/radiation/quality/duration/timing/severity/associated sxs/prior Treatment) HPI Comments: Patient presents with burning on urination and pressure on urination. She also has urinary frequency. This has been on for about 4 days and continues to slowly worsen. She denies any abdominal pain. She denies any back pain. She has no nausea vomiting or fevers. She's had a history of UTIs in the past and this feels similar. She denies any vaginal discharge. She does have a little bit of vaginal spotting currently. She's had irregular periods over the last few months. She also has a bifrontal-type headache. She has a history of hypertension and she states her blood pressures been chronically high in the 170/90 range. She typically gets headaches like this when her blood pressures higher. She states she's been following up with her primary care physician who's been adjusting her blood pressure medicine but they haven't found any medicine that is controlling it well. She denies any chest pain or shortness of breath. She denies any numbness or weakness in her extremities. She denies any speech difficulties. She denies any photophobia or vision changes.  Patient is a 42 y.o. female presenting with dysuria.  Dysuria Associated symptoms: no abdominal pain, no fever, no flank pain, no nausea, no vaginal discharge and no vomiting     Past Medical History  Diagnosis Date  . Back pain   . Hypertension   . GERD (gastroesophageal reflux disease)   . Eczema    Past Surgical History  Procedure Laterality Date  . Tubal ligation     No family history on file. History  Substance Use Topics  . Smoking status: Never Smoker   . Smokeless tobacco: Not on file  . Alcohol Use: No   OB History   Grav Para Term Preterm Abortions  TAB SAB Ect Mult Living                 Review of Systems  Constitutional: Negative for fever, chills, diaphoresis and fatigue.  HENT: Negative for congestion, rhinorrhea and sneezing.   Eyes: Negative.   Respiratory: Negative for cough, chest tightness and shortness of breath.   Cardiovascular: Negative for chest pain and leg swelling.  Gastrointestinal: Negative for nausea, vomiting, abdominal pain, diarrhea and blood in stool.  Genitourinary: Positive for dysuria, urgency, frequency and vaginal bleeding. Negative for hematuria, flank pain, vaginal discharge, difficulty urinating and vaginal pain.  Musculoskeletal: Negative for arthralgias and back pain.  Skin: Negative for rash.  Neurological: Positive for headaches. Negative for dizziness, speech difficulty, weakness and numbness.    Allergies  Latex  Home Medications   Current Outpatient Rx  Name  Route  Sig  Dispense  Refill  . ciprofloxacin (CIPRO) 500 MG tablet   Oral   Take 1 tablet (500 mg total) by mouth 2 (two) times daily. One po bid x 7 days   14 tablet   0   . lisinopril (PRINIVIL,ZESTRIL) 40 MG tablet   Oral   Take 1 tablet (40 mg total) by mouth daily.   30 tablet   1   . triamcinolone ointment (KENALOG) 0.5 %   Topical   Apply topically 2 (two) times daily.   45 g   1    BP 171/90  Pulse 87  Temp(Src) 98.6 F (37 C) (Oral)  Resp 18  Ht 5\' 7"  (1.702 m)  Wt 205 lb (92.987 kg)  BMI 32.10 kg/m2  SpO2 100% Physical Exam  Constitutional: She is oriented to person, place, and time. She appears well-developed and well-nourished.  HENT:  Head: Normocephalic and atraumatic.  Eyes: Pupils are equal, round, and reactive to light.  Neck: Normal range of motion. Neck supple.  Cardiovascular: Normal rate, regular rhythm and normal heart sounds.   Pulmonary/Chest: Effort normal and breath sounds normal. No respiratory distress. She has no wheezes. She has no rales. She exhibits no tenderness.  Abdominal:  Soft. Bowel sounds are normal. There is no tenderness. There is no rebound and no guarding.  Musculoskeletal: Normal range of motion. She exhibits no edema.  Lymphadenopathy:    She has no cervical adenopathy.  Neurological: She is alert and oriented to person, place, and time. She has normal strength. No cranial nerve deficit or sensory deficit. GCS eye subscore is 4. GCS verbal subscore is 5. GCS motor subscore is 6.  Skin: Skin is warm and dry. No rash noted.  Psychiatric: She has a normal mood and affect.    ED Course  Procedures (including critical care time) Labs Review Labs Reviewed  URINALYSIS, ROUTINE W REFLEX MICROSCOPIC - Abnormal; Notable for the following:    Color, Urine ORANGE (*)    APPearance CLOUDY (*)    Hgb urine dipstick LARGE (*)    Protein, ur 100 (*)    Nitrite POSITIVE (*)    Leukocytes, UA LARGE (*)    All other components within normal limits  URINE MICROSCOPIC-ADD ON - Abnormal; Notable for the following:    Squamous Epithelial / LPF FEW (*)    Bacteria, UA MANY (*)    All other components within normal limits  PREGNANCY, URINE   Imaging Review No results found.  EKG Interpretation   None       MDM   1. UTI (lower urinary tract infection)   2. HTN (hypertension)    Patient and a shot of Toradol her headache. She was driving to anything sedating. I reviewed her records and her blood pressure seems to be running in the 170/90 range. She's otherwise asymptomatic. I advised her to followup with her primary care physician this week regarding blood pressure management. She was started on Cipro for her UTI. She was also given a dose of Diflucan given that she had yeast in her urine as well.    Rolan Bucco, MD 08/28/13 0900

## 2013-08-28 NOTE — ED Notes (Signed)
Pt having dysuria x 4 days.  No abdominal or back pain, no fever.

## 2013-08-30 LAB — URINE CULTURE: Colony Count: 80000

## 2013-09-23 ENCOUNTER — Ambulatory Visit (INDEPENDENT_AMBULATORY_CARE_PROVIDER_SITE_OTHER): Payer: Medicaid Other | Admitting: Family Medicine

## 2013-09-23 ENCOUNTER — Encounter: Payer: Self-pay | Admitting: Family Medicine

## 2013-09-23 VITALS — BP 139/93 | HR 88 | Temp 97.7°F | Ht 67.0 in | Wt 243.5 lb

## 2013-09-23 DIAGNOSIS — S46812A Strain of other muscles, fascia and tendons at shoulder and upper arm level, left arm, initial encounter: Secondary | ICD-10-CM

## 2013-09-23 DIAGNOSIS — S46819A Strain of other muscles, fascia and tendons at shoulder and upper arm level, unspecified arm, initial encounter: Secondary | ICD-10-CM

## 2013-09-23 DIAGNOSIS — S43499A Other sprain of unspecified shoulder joint, initial encounter: Secondary | ICD-10-CM

## 2013-09-23 DIAGNOSIS — Z23 Encounter for immunization: Secondary | ICD-10-CM

## 2013-09-23 MED ORDER — NAPROXEN 500 MG PO TABS: 500.0000 mg | ORAL_TABLET | Freq: Two times a day (BID) | ORAL | Status: DC

## 2013-09-23 MED ORDER — CYCLOBENZAPRINE HCL 10 MG PO TABS: 10.0000 mg | ORAL_TABLET | Freq: Three times a day (TID) | ORAL | Status: DC | PRN

## 2013-09-23 NOTE — Patient Instructions (Signed)
Dear Summer Brewer, Thank you for coming in to clinic today. It was good to meet you!  Today we discussed your Shoulder / Back Pain. 1. I believe that you have strained your trapezius muscle, otherwise your exam was reassuring, I do not believe that there is any structural damage to your back or shoulder. With muscle strains it may take a few weeks to fully heal, and it will be important to not overdo it with heavy lifting or overuse within 1-2 weeks. 2. Start Naproxen 500mg  take 1 tablet every 12 hours (twice daily) with food for the next 2 weeks. 3. Start Flexeril 10mg  up to 3 times daily as needed for pain / spasm. 4. Keep using the heating pad, moist heat, work on range of motion exercises and don't completely stop using your shoulder, as it may become worse. Keep active but don't overdo it.  Please schedule a follow-up appointment with Dr. Ardelia Mems in 2 weeks if not improving.  If you have any other questions or concerns, please feel free to call the clinic to contact me. You may also schedule an earlier appointment if necessary.  However, if your symptoms get significantly worse, please go to the Emergency Department to seek immediate medical attention.  Nobie Putnam, DO [facility]   Muscle Strain A muscle strain is an injury that occurs when a muscle is stretched beyond its normal length. Usually a small number of muscle fibers are torn when this happens. Muscle strain is rated in degrees. First-degree strains have the least amount of muscle fiber tearing and pain. Second-degree and third-degree strains have increasingly more tearing and pain.  Usually, recovery from muscle strain takes 1 2 weeks. Complete healing takes 5 6 weeks.  CAUSES  Muscle strain happens when a sudden, violent force placed on a muscle stretches it too far. This may occur with lifting, sports, or a fall.  RISK FACTORS Muscle strain is especially common in athletes.  SIGNS AND  SYMPTOMS At the site of the muscle strain, there may be:  Pain.  Bruising.  Swelling.  Difficulty using the muscle due to pain or lack of normal function. DIAGNOSIS  Your health care provider will perform a physical exam and ask about your medical history. TREATMENT  Often, the best treatment for a muscle strain is resting, icing, and applying cold compresses to the injured area.  HOME CARE INSTRUCTIONS   Use the PRICE method of treatment to promote muscle healing during the first 2 3 days after your injury. The PRICE method involves:  Protecting the muscle from being injured again.  Restricting your activity and resting the injured body part.  Icing your injury. To do this, put ice in a plastic bag. Place a towel between your skin and the bag. Then, apply the ice and leave it on from 15 20 minutes each hour. After the third day, switch to moist heat packs.  Apply compression to the injured area with a splint or elastic bandage. Be careful not to wrap it too tightly. This may interfere with blood circulation or increase swelling.  Elevate the injured body part above the level of your heart as often as you can.  Only take over-the-counter or prescription medicines for pain, discomfort, or fever as directed by your health care provider.  Warming up prior to exercise helps to prevent future muscle strains. SEEK MEDICAL CARE IF:   You have increasing pain or swelling in the injured area.  You have numbness, tingling, or  a significant loss of strength in the injured area. MAKE SURE YOU:   Understand these instructions.  Will watch your condition.  Will get help right away if you are not doing well or get worse. Document Released: 08/25/2005 Document Revised: 06/15/2013 Document Reviewed: 03/24/2013 [facility] Patient Information 2014 [place], [place].

## 2013-09-23 NOTE — Assessment & Plan Note (Signed)
Suspected likely L-trapezius strain given history (gradual onset, no acute injury, likely related to lifting) reassuring clinical exam (shoulder structurally intact), no evidence of neurological deficits. No red flags for back pain.  Plan: 1. Performed OMT L-Trapezius myofascial release (consented, agreed to treatment, tolerated well). 2. Start Naproxen 500mg  BID x 2 weeks 3. Start Flexeril 10mg  TID PRN (advised regular nightly use for several days to 1 week) 4. Conservative management with relative rest, ROM exercises / stretching, trapezius massage technique demonstrated, continue heating pad. 5. RTC in 2 weeks if not improved

## 2013-09-23 NOTE — Progress Notes (Signed)
Subjective:     Patient ID: Summer Brewer, female   DOB: May 30, 1971, 43 y.o.   MRN: [id number]  Patient presents for same day visit with daughter.  HPI  LEFT SHOULDER / UPPER BACK PAIN: Reports chronic low back pain for several years, improved after manipulation last year without problems since. New complain of upper-mid back pain started yesterday (09/22/13) around 3:30pm, center localized to single spot, 4/10 initially, standing up when occurred, however earlier that morning lifted heavy bucket / do not believe that it was acutely injured at that time. Gradually worsening over 24 hours, difficulty sleeping last night, and this morning woke up with 10/10, described as constant ache, but when lifting left arm or significant movements feels sharp stabbing localized pain without radiation or numbness / tingling, no weakness. 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     Objective:   Physical Exam  BP 139/93  Pulse 88  Temp(Src) 97.7 F (36.5 C) (Oral)  Ht 5\' 7"  (1.702 m)  Wt 243 lb 8 oz (110.451 kg)  BMI 38.13 kg/m2  Gen - well-appearing, NAD HEENT - NCAT, PERRL, EOMI, MMM Neck - supple, non-tender, FROM with rotation / flexion/extension Heart - RRR, no murmurs heard Lungs - CTAB, no wheezing, crackles, or rhonchi. Normal work of breathing. MSK - Left Trapezius: notably tender to palpation, tense with hypertrophy/spasm, no identifiable knot or trigger point. R-Trapezius: mildly tender without significant spasm. C-spine - non-tender, FROM, T/L Spine: non-tender to  palpation of spinous processes, appropriate ROM Ext - Bilateral shoulders - structurally intact with negative impingement, FROM including internal rotation (except Left limited full above shoulder abduction), no edema, peripheral pulses intact +2 b/l Skin - warm, dry, no rashes Neuro - awake, alert, oriented, grossly non-focal, intact muscle strength 5/5 b/l, intact distal sensation to light touch, gait normal     Assessment:     See specific A&P problem list for details.     Plan:     See specific A&P problem list for details.

## 2013-12-29 ENCOUNTER — Ambulatory Visit: Payer: Medicaid Other

## 2014-01-13 ENCOUNTER — Emergency Department (HOSPITAL_BASED_OUTPATIENT_CLINIC_OR_DEPARTMENT_OTHER)
Admission: EM | Admit: 2014-01-13 | Discharge: 2014-01-13 | Disposition: A | Discharge status: Home / Self Care | Payer: Medicaid Other | Attending: Emergency Medicine | Admitting: Emergency Medicine

## 2014-01-13 ENCOUNTER — Encounter (HOSPITAL_BASED_OUTPATIENT_CLINIC_OR_DEPARTMENT_OTHER): Payer: Self-pay | Admitting: Emergency Medicine

## 2014-01-13 DIAGNOSIS — Z791 Long term (current) use of non-steroidal anti-inflammatories (NSAID): Secondary | ICD-10-CM | POA: Insufficient documentation

## 2014-01-13 DIAGNOSIS — I10 Essential (primary) hypertension: Secondary | ICD-10-CM

## 2014-01-13 DIAGNOSIS — Z3202 Encounter for pregnancy test, result negative: Secondary | ICD-10-CM | POA: Insufficient documentation

## 2014-01-13 DIAGNOSIS — Z8719 Personal history of other diseases of the digestive system: Secondary | ICD-10-CM | POA: Insufficient documentation

## 2014-01-13 DIAGNOSIS — Z792 Long term (current) use of antibiotics: Secondary | ICD-10-CM | POA: Insufficient documentation

## 2014-01-13 DIAGNOSIS — Z91040 Latex allergy status: Secondary | ICD-10-CM | POA: Insufficient documentation

## 2014-01-13 DIAGNOSIS — L309 Dermatitis, unspecified: Secondary | ICD-10-CM

## 2014-01-13 DIAGNOSIS — J3489 Other specified disorders of nose and nasal sinuses: Secondary | ICD-10-CM | POA: Insufficient documentation

## 2014-01-13 DIAGNOSIS — L259 Unspecified contact dermatitis, unspecified cause: Secondary | ICD-10-CM | POA: Insufficient documentation

## 2014-01-13 LAB — URINALYSIS, ROUTINE W REFLEX MICROSCOPIC
Glucose, UA: NEGATIVE mg/dL
Hgb urine dipstick: NEGATIVE
Ketones, ur: 15 mg/dL — AB
NITRITE: NEGATIVE
Protein, ur: NEGATIVE mg/dL
SPECIFIC GRAVITY, URINE: 1.029 (ref 1.005–1.030)
UROBILINOGEN UA: 1 mg/dL (ref 0.0–1.0)
pH: 6 (ref 5.0–8.0)

## 2014-01-13 LAB — URINE MICROSCOPIC-ADD ON

## 2014-01-13 LAB — PREGNANCY, URINE: PREG TEST UR: NEGATIVE

## 2014-01-13 MED ORDER — AMLODIPINE BESYLATE 5 MG PO TABS: 5.0000 mg | ORAL_TABLET | Freq: Every day | ORAL | Status: DC

## 2014-01-13 MED ORDER — PREDNISONE 20 MG PO TABS: 40.0000 mg | ORAL_TABLET | Freq: Every day | ORAL | Status: DC

## 2014-01-13 MED ORDER — TRIAMCINOLONE ACETONIDE 0.5 % EX OINT: TOPICAL_OINTMENT | Freq: Two times a day (BID) | CUTANEOUS | Status: DC

## 2014-01-13 NOTE — ED Notes (Signed)
Pt c/o HA that began 4 days ago and sts that she feels like her BP has been high since then. Pt also c/o eczema flare up x2 weeks with no relief from her usual creams.

## 2014-01-13 NOTE — ED Provider Notes (Signed)
CSN: [id number]     Arrival date & time 01/13/14  4742 History   First MD Initiated Contact with Patient 01/13/14 [phone]     Chief Complaint  Patient presents with  . Headache  . Eczema     (Consider location/radiation/quality/duration/timing/severity/associated sxs/prior Treatment) Patient is a 43 y.o. female presenting with headaches and rash. The history is provided by the patient.  Headache Pain location:  Frontal Quality:  Dull Radiates to:  Does not radiate Severity currently:  8/10 Severity at highest:  8/10 Onset quality:  Gradual Duration: years. Timing:  Intermittent Progression:  Waxing and waning Chronicity:  Chronic Similar to prior headaches: yes   Context: not activity, not exposure to bright light, not defecating, not intercourse and not loud noise   Relieved by:  Acetaminophen Worsened by:  Nothing tried Associated symptoms: sinus pressure   Associated symptoms: no abdominal pain, no back pain, no blurred vision, no cough, no ear pain, no pain, no facial pain, no fever, no focal weakness, no loss of balance, no nausea, no neck pain, no neck stiffness, no photophobia, no visual change and no vomiting   Rash Location:  Shoulder/arm Shoulder/arm rash location:  L arm Quality: itchiness and redness   Quality: not painful, not peeling, not scaling, not swelling and not weeping   Severity:  Moderate Onset quality:  Gradual Duration:  2 weeks Timing:  Constant Progression:  Unchanged Chronicity:  Recurrent Context: not animal contact, not insect bite/sting, not medications, not new detergent/soap, not plant contact, not pollen, not pregnancy and not sick contacts   Context comment:  Similar rash last year at the same time Relieved by:  Nothing Worsened by:  Nothing tried Ineffective treatments:  Antihistamines and topical steroids Associated symptoms: headaches   Associated symptoms: no abdominal pain, no fever, no nausea and not vomiting     Past Medical  History  Diagnosis Date  . Back pain   . Hypertension   . GERD (gastroesophageal reflux disease)   . Eczema    Past Surgical History  Procedure Laterality Date  . Tubal ligation    . Cesarean section     No family history on file. History  Substance Use Topics  . Smoking status: Never Smoker   . Smokeless tobacco: Not on file  . Alcohol Use: No   OB History   Grav Para Term Preterm Abortions TAB SAB Ect Mult Living                 Review of Systems  Constitutional: Negative for fever.  HENT: Positive for sinus pressure. Negative for ear pain.   Eyes: Negative for blurred vision, photophobia and pain.  Respiratory: Negative for cough.   Gastrointestinal: Negative for nausea, vomiting and abdominal pain.  Genitourinary: Negative for dysuria, frequency, vaginal discharge, menstrual problem and pelvic pain.       Foul smelling urine for the last week  Musculoskeletal: Negative for back pain, neck pain and neck stiffness.  Skin: Positive for rash.  Neurological: Positive for headaches. Negative for focal weakness and loss of balance.      Allergies  Latex  Home Medications   Prior to Admission medications   Medication Sig Start Date End Date Taking? Authorizing Provider  hydrALAZINE (APRESOLINE) 10 MG tablet Take 10 mg by mouth 3 (three) times daily.   Yes Historical Provider, MD  ciprofloxacin (CIPRO) 500 MG tablet Take 1 tablet (500 mg total) by mouth 2 (two) times daily. One po bid x 7 days  08/28/13   Malvin Johns, MD  cyclobenzaprine (FLEXERIL) 10 MG tablet Take 1 tablet (10 mg total) by mouth 3 (three) times daily as needed for muscle spasms. 09/23/13   Nobie Putnam, DO  lisinopril (PRINIVIL,ZESTRIL) 40 MG tablet Take 1 tablet (40 mg total) by mouth daily. 07/19/12   Martha Clan, MD  naproxen (NAPROSYN) 500 MG tablet Take 1 tablet (500 mg total) by mouth 2 (two) times daily with a meal. 09/23/13   Nobie Putnam, DO  triamcinolone ointment (KENALOG)  0.5 % Apply topically 2 (two) times daily. 01/03/13   Alveda Reasons, MD   BP 174/119  Pulse 104  Temp(Src) 98.9 F (37.2 C) (Oral)  Resp 18  Ht 5\' 4"  (1.626 m)  Wt 232 lb (105.235 kg)  BMI 39.80 kg/m2  SpO2 98% Physical Exam  Nursing note and vitals reviewed. Constitutional: She is oriented to person, place, and time. She appears well-developed and well-nourished. No distress.  HENT:  Head: Normocephalic and atraumatic.  Right Ear: Tympanic membrane and ear canal normal.  Left Ear: Tympanic membrane and ear canal normal.  Nose: Mucosal edema present.  Mouth/Throat: Oropharynx is clear and moist and mucous membranes are normal. Mucous membranes are not dry.  Eyes: Conjunctivae and EOM are normal. Pupils are equal, round, and reactive to light.  Neck: Normal range of motion. Neck supple.  Cardiovascular: Normal rate, regular rhythm and intact distal pulses.   No murmur heard. Pulmonary/Chest: Effort normal and breath sounds normal. No respiratory distress. She has no wheezes. She has no rales.  Abdominal: Soft. She exhibits no distension. There is no tenderness. There is no rebound and no guarding.  Musculoskeletal: Normal range of motion. She exhibits no edema and no tenderness.  Neurological: She is alert and oriented to person, place, and time.  Skin: Skin is warm and dry. Rash noted. No ecchymosis and no lesion noted. Rash is maculopapular. Rash is not pustular and not urticarial. No erythema.     Psychiatric: She has a normal mood and affect. Her behavior is normal.    ED Course  Procedures (including critical care time) Labs Review Labs Reviewed  URINALYSIS, ROUTINE W REFLEX MICROSCOPIC - Abnormal; Notable for the following:    Color, Urine AMBER (*)    APPearance CLOUDY (*)    Bilirubin Urine SMALL (*)    Ketones, ur 15 (*)    Leukocytes, UA TRACE (*)    All other components within normal limits  URINE MICROSCOPIC-ADD ON - Abnormal; Notable for the following:     Squamous Epithelial / LPF MANY (*)    Bacteria, UA MANY (*)    All other components within normal limits  PREGNANCY, URINE    Imaging Review No results found.   EKG Interpretation None      MDM   Final diagnoses:  None    Patient presents with periods complaints including a persistent rash for 2 weeks consistent with eczema that she had last year that is not improving with triamcinolone cream. There is no signs on exam of bacterial or fungal rashes and appears to be reactive or eczema. Will give patient a refill of her triamcinolone ointment as well as oral prednisone.  Patient complains of persistent intermittent headaches that are different from her prior headaches. She states she gets them intermittently all the time and they are no different. No photophobia, vision changes or focal neurologic deficits. No symptoms concerning for space occupying lesion, subarachnoid hemorrhage or cavernous venous thrombosis. Patient is hypertensive  here will recheck to confirm this also could be the cause of her headaches. Also she is complaining of congestion and some of the headaches may be related to allergy related causes.  Lastly patient is complaining of foul-smelling urine for the last one week. She denies dysuria, vaginal discharge, new sexual partners, fever, abdominal pain or flank pain. UA pending for further evaluation.  9:27 AM UA is contaminated but no overt evidence of UTI.  Will send culture.  Repeat BP 154/102 and recommended that pt f/u with PcP for better blood pressure control.  Pt states she has difficulty seeing her PCP and she has been seen in resident clinic but no one has changed her bp meds even though noted it needs to be done.  Will start pt on amlodipine 5mg  in addition to lisinopril and have her f/u.  Blanchie Dessert, MD 01/13/14 [phone]

## 2014-01-13 NOTE — ED Notes (Signed)
MD at bedside. 

## 2014-01-13 NOTE — Discharge Instructions (Signed)
Arterial Hypertension °Arterial hypertension (high blood pressure) is a condition of elevated pressure in your blood vessels. Hypertension over a long period of time is a risk factor for strokes, heart attacks, and heart failure. It is also the leading cause of kidney (renal) failure.  °CAUSES  °· In Adults -- Over 90% of all hypertension has no known cause. This is called essential or primary hypertension. In the other 10% of people with hypertension, the increase in blood pressure is caused by another disorder. This is called secondary hypertension. Important causes of secondary hypertension are: °· Heavy alcohol use. °· Obstructive sleep apnea. °· Hyperaldosterosim (Conn's syndrome). °· Steroid use. °· Chronic kidney failure. °· Hyperparathyroidism. °· Medications. °· Renal artery stenosis. °· Pheochromocytoma. °· Cushing's disease. °· Coarctation of the aorta. °· Scleroderma renal crisis. °· Licorice (in excessive amounts). °· Drugs (cocaine, methamphetamine). °Your caregiver can explain any items above that apply to you. °· In Children -- Secondary hypertension is more common and should always be considered. °· Pregnancy -- Few women of childbearing age have high blood pressure. However, up to 10% of them develop hypertension of pregnancy. Generally, this will not harm the woman. It may be a sign of 3 complications of pregnancy: preeclampsia, HELLP syndrome, and eclampsia. Follow up and control with medication is necessary. °SYMPTOMS  °· This condition normally does not produce any noticeable symptoms. It is usually found during a routine exam. °· Malignant hypertension is a late problem of high blood pressure. It may have the following symptoms: °· Headaches. °· Blurred vision. °· End-organ damage (this means your kidneys, heart, lungs, and other organs are being damaged). °· Stressful situations can increase the blood pressure. If a person with normal blood pressure has their blood pressure go up while being  seen by their caregiver, this is often termed "white coat hypertension." Its importance is not known. It may be related with eventually developing hypertension or complications of hypertension. °· Hypertension is often confused with mental tension, stress, and anxiety. °DIAGNOSIS  °The diagnosis is made by 3 separate blood pressure measurements. They are taken at least 1 week apart from each other. If there is organ damage from hypertension, the diagnosis may be made without repeat measurements. °Hypertension is usually identified by having blood pressure readings: °· Above 140/90 mmHg measured in both arms, at 3 separate times, over a couple weeks. °· Over 130/80 mmHg should be considered a risk factor and may require treatment in patients with diabetes. °Blood pressure readings over 120/80 mmHg are called "pre-hypertension" even in non-diabetic patients. °To get a true blood pressure measurement, use the following guidelines. Be aware of the factors that can alter blood pressure readings. °· Take measurements at least 1 hour after caffeine. °· Take measurements 30 minutes after smoking and without any stress. This is another reason to quit smoking  it raises your blood pressure. °· Use a proper cuff size. Ask your caregiver if you are not sure about your cuff size. °· Most home blood pressure cuffs are automatic. They will measure systolic and diastolic pressures. The systolic pressure is the pressure reading at the start of sounds. Diastolic pressure is the pressure at which the sounds disappear. If you are elderly, measure pressures in multiple postures. Try sitting, lying or standing. °· Sit at rest for a minimum of 5 minutes before taking measurements. °· You should not be on any medications like decongestants. These are found in many cold medications. °· Record your blood pressure readings and review   them with your caregiver. °If you have hypertension: °· Your caregiver may do tests to be sure you do not have  secondary hypertension (see "causes" above). °· Your caregiver may also look for signs of metabolic syndrome. This is also called Syndrome X or Insulin Resistance Syndrome. You may have this syndrome if you have type 2 diabetes, abdominal obesity, and abnormal blood lipids in addition to hypertension. °· Your caregiver will take your medical and family history and perform a physical exam. °· Diagnostic tests may include blood tests (for glucose, cholesterol, potassium, and kidney function), a urinalysis, or an EKG. Other tests may also be necessary depending on your condition. °PREVENTION  °There are important lifestyle issues that you can adopt to reduce your chance of developing hypertension: °· Maintain a normal weight. °· Limit the amount of salt (sodium) in your diet. °· Exercise often. °· Limit alcohol intake. °· Get enough potassium in your diet. Discuss specific advice with your caregiver. °· Follow a DASH diet (dietary approaches to stop hypertension). This diet is rich in fruits, vegetables, and low-fat dairy products, and avoids certain fats. °PROGNOSIS  °Essential hypertension cannot be cured. Lifestyle changes and medical treatment can lower blood pressure and reduce complications. The prognosis of secondary hypertension depends on the underlying cause. Many people whose hypertension is controlled with medicine or lifestyle changes can live a normal, healthy life.  °RISKS AND COMPLICATIONS  °While high blood pressure alone is not an illness, it often requires treatment due to its short- and long-term effects on many organs. Hypertension increases your risk for: °· CVAs or strokes (cerebrovascular accident). °· Heart failure due to chronically high blood pressure (hypertensive cardiomyopathy). °· Heart attack (myocardial infarction). °· Damage to the retina (hypertensive retinopathy). °· Kidney failure (hypertensive nephropathy). °Your caregiver can explain list items above that apply to you. Treatment  of hypertension can significantly reduce the risk of complications. °TREATMENT  °· For overweight patients, weight loss and regular exercise are recommended. Physical fitness lowers blood pressure. °· Mild hypertension is usually treated with diet and exercise. A diet rich in fruits and vegetables, fat-free dairy products, and foods low in fat and salt (sodium) can help lower blood pressure. Decreasing salt intake decreases blood pressure in a 1/3 of people. °· Stop smoking if you are a smoker. °The steps above are highly effective in reducing blood pressure. While these actions are easy to suggest, they are difficult to achieve. Most patients with moderate or severe hypertension end up requiring medications to bring their blood pressure down to a normal level. There are several classes of medications for treatment. Blood pressure pills (antihypertensives) will lower blood pressure by their different actions. Lowering the blood pressure by 10 mmHg may decrease the risk of complications by as much as 25%. °The goal of treatment is effective blood pressure control. This will reduce your risk for complications. Your caregiver will help you determine the best treatment for you according to your lifestyle. What is excellent treatment for one person, may not be for you. °HOME CARE INSTRUCTIONS  °· Do not smoke. °· Follow the lifestyle changes outlined in the "Prevention" section. °· If you are on medications, follow the directions carefully. Blood pressure medications must be taken as prescribed. Skipping doses reduces their benefit. It also puts you at risk for problems. °· Follow up with your caregiver, as directed. °· If you are asked to monitor your blood pressure at home, follow the guidelines in the "Diagnosis" section above. °SEEK MEDICAL CARE   IF:  °· You think you are having medication side effects. °· You have recurrent headaches or lightheadedness. °· You have swelling in your ankles. °· You have trouble with  your vision. °SEEK IMMEDIATE MEDICAL CARE IF:  °· You have sudden onset of chest pain or pressure, difficulty breathing, or other symptoms of a heart attack. °· You have a severe headache. °· You have symptoms of a stroke (such as sudden weakness, difficulty speaking, difficulty walking). °MAKE SURE YOU:  °· Understand these instructions. °· Will watch your condition. °· Will get help right away if you are not doing well or get worse. °Document Released: 08/25/2005 Document Revised: 11/17/2011 Document Reviewed: 03/25/2007 °[employer]® Patient Information ©2014 [employer]. ° °

## 2014-01-15 LAB — URINE CULTURE: Colony Count: >=100000

## 2014-01-16 ENCOUNTER — Telehealth (HOSPITAL_BASED_OUTPATIENT_CLINIC_OR_DEPARTMENT_OTHER): Payer: Self-pay | Admitting: Emergency Medicine

## 2014-01-16 NOTE — Telephone Encounter (Signed)
Post ED Visit - Positive Culture Follow-up: Successful Patient Follow-Up  Culture assessed and recommendations reviewed by: []  Wes [place], Pharm.D., BCPS []  Heide Guile, Pharm.D., BCPS []  Alycia Rossetti, Pharm.D., BCPS [x]  [place], Pharm.D., BCPS, AAHIVP []  Legrand Como, Pharm.D., BCPS, AAHIVP  Positive urine culture  [x]  Patient discharged without antimicrobial prescription and treatment is now indicated []  Organism is resistant to prescribed ED discharge antimicrobial []  Patient with positive blood cultures  Changes discussed with ED provider: Baron Sane PA-C New antibiotic prescription: Septra DS 1 tab PO BID x 3 days    [facility] 01/16/2014, 1:47 PM

## 2014-01-16 NOTE — Progress Notes (Signed)
ED Antimicrobial Stewardship Positive Culture Follow Up   Summer Brewer is an 43 y.o. female who presented to [facility] on 01/13/2014 with a chief complaint of  Chief Complaint  Patient presents with  . Headache  . Eczema    Recent Results (from the past 720 hour(s))  URINE CULTURE     Status: None   Collection Time    01/13/14  9:00 AM      Result Value Ref Range Status   Specimen Description URINE, CLEAN CATCH   Final   Special Requests NONE   Final   Culture  Setup Time     Final   Value: 01/13/2014 15:13     Performed at [facility]     Final   Value: >=100,000 COLONIES/ML     Performed at [facility]   Culture     Final   Value: KLEBSIELLA PNEUMONIAE     Performed at [facility]   Report Status 01/15/2014 FINAL   Final   Organism ID, Bacteria KLEBSIELLA PNEUMONIAE   Final    [x]  Patient discharged originally without antimicrobial agent and treatment is now indicated  43 yo who came in with eczema and headache. The UA in the ED looked contaminated so a urine culture was sent. It came back with >100k klebsiella. We'll treat with a short course of septra.   New antibiotic prescription:   Septra DS 1 PO BID x 3days  ED Provider: Jacinto Reap, [place]  Onnie Boer, PharmD Pager: [phone] Infectious Diseases Pharmacist Phone# [phone]

## 2014-03-06 ENCOUNTER — Other Ambulatory Visit: Payer: Self-pay | Admitting: *Deleted

## 2014-03-31 ENCOUNTER — Ambulatory Visit (INDEPENDENT_AMBULATORY_CARE_PROVIDER_SITE_OTHER): Payer: Self-pay | Admitting: Family Medicine

## 2014-03-31 ENCOUNTER — Encounter: Payer: Self-pay | Admitting: Family Medicine

## 2014-03-31 ENCOUNTER — Telehealth: Payer: Self-pay | Admitting: Family Medicine

## 2014-03-31 ENCOUNTER — Other Ambulatory Visit (HOSPITAL_COMMUNITY)
Admission: RE | Admit: 2014-03-31 | Discharge: 2014-03-31 | Disposition: A | Discharge status: Home / Self Care | Payer: Medicaid Other | Source: Ambulatory Visit | Attending: Family Medicine | Admitting: Family Medicine

## 2014-03-31 VITALS — BP 152/102 | HR 81 | Temp 97.4°F | Ht 67.0 in | Wt 254.5 lb

## 2014-03-31 DIAGNOSIS — Z113 Encounter for screening for infections with a predominantly sexual mode of transmission: Secondary | ICD-10-CM | POA: Insufficient documentation

## 2014-03-31 DIAGNOSIS — I10 Essential (primary) hypertension: Secondary | ICD-10-CM

## 2014-03-31 DIAGNOSIS — L309 Dermatitis, unspecified: Secondary | ICD-10-CM

## 2014-03-31 DIAGNOSIS — Z20828 Contact with and (suspected) exposure to other viral communicable diseases: Secondary | ICD-10-CM

## 2014-03-31 DIAGNOSIS — L259 Unspecified contact dermatitis, unspecified cause: Secondary | ICD-10-CM

## 2014-03-31 DIAGNOSIS — B9689 Other specified bacterial agents as the cause of diseases classified elsewhere: Secondary | ICD-10-CM | POA: Insufficient documentation

## 2014-03-31 DIAGNOSIS — Z202 Contact with and (suspected) exposure to infections with a predominantly sexual mode of transmission: Secondary | ICD-10-CM

## 2014-03-31 DIAGNOSIS — N898 Other specified noninflammatory disorders of vagina: Secondary | ICD-10-CM | POA: Insufficient documentation

## 2014-03-31 LAB — BASIC METABOLIC PANEL
BUN: 11 mg/dL (ref 6–23)
CHLORIDE: 105 meq/L (ref 96–112)
CO2: 26 mEq/L (ref 19–32)
CREATININE: 0.84 mg/dL (ref 0.50–1.10)
Calcium: 8.7 mg/dL (ref 8.4–10.5)
Glucose, Bld: 79 mg/dL (ref 70–99)
Potassium: 4 mEq/L (ref 3.5–5.3)
Sodium: 138 mEq/L (ref 135–145)

## 2014-03-31 LAB — POCT WET PREP (WET MOUNT): Clue Cells Wet Prep Whiff POC: POSITIVE

## 2014-03-31 MED ORDER — PREDNISONE 20 MG PO TABS: 20.0000 mg | ORAL_TABLET | Freq: Every day | ORAL | Status: DC

## 2014-03-31 MED ORDER — METRONIDAZOLE 0.75 % VA GEL: Freq: Every day | VAGINAL | Status: DC

## 2014-03-31 MED ORDER — AMLODIPINE BESYLATE 10 MG PO TABS: 10.0000 mg | ORAL_TABLET | Freq: Every day | ORAL | Status: DC

## 2014-03-31 NOTE — Assessment & Plan Note (Signed)
No fevers, chills, pain/burning, or itching. Similar to her previous BV with odor. - Check Wet Prep and GC/Chlamydia. 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 - Will rx metrogel course. - Return precautions reviewed.

## 2014-03-31 NOTE — Assessment & Plan Note (Signed)
BP 150s/100s initially, changed to 144/100s on recheck. Chronic. Likely contributing to headache. On lisinopril and amlodipine. - Increase amlodipine to 10mg  daily. - Follow up in next few weeks with Dr Ardelia Mems for further eval. Informed her that if she lets them know up front, they can facilitate making making the appt at Dr Lennie Odor next available 15 min slot to just discuss HTN. - Return precautions including chest pain, dyspnea, or other concerns reviewed. - TSH and BMET today, not checked recently. - Check BP at home 2-3 times over next week and call with these values. - Discussed not taking naproxen daily to prevent both rebound/analgesia-induced headache and persistent HTN.

## 2014-03-31 NOTE — Telephone Encounter (Signed)
Left message on voice mail. When pt calls, please let her know her wet prep is positive for BV but negative for yeast and trichomonas. Had sent in metrogel during office visit so she should use this.  Hilton Sinclair, MD

## 2014-03-31 NOTE — Patient Instructions (Signed)
I am checking labs and will call if any are NOT normal. Please seek immediate care if you have chest pain or trouble breathing. Follow up with Dr Ardelia Mems at her next available that is convenient for you to discuss hypertension. I am refilling metrogel and increasing norvasc to 10mg  daily. If you do not have a BP cuff at home, please check BP at your drug store 2-3 times over the next week and call with these values.  Best,  Hilton Sinclair, MD  Hypertension Hypertension, commonly called high blood pressure, is when the force of blood pumping through your arteries is too strong. Your arteries are the blood vessels that carry blood from your heart throughout your body. A blood pressure reading consists of a higher number over a lower number, such as 110/72. The higher number (systolic) is the pressure inside your arteries when your heart pumps. The lower number (diastolic) is the pressure inside your arteries when your heart relaxes. Ideally you want your blood pressure below 120/80. Hypertension forces your heart to work harder to pump blood. Your arteries may become narrow or stiff. Having hypertension puts you at risk for heart disease, stroke, and other problems.  RISK FACTORS Some risk factors for high blood pressure are controllable. Others are not.  Risk factors you cannot control include:   Race. You may be at higher risk if you are African American.  Age. Risk increases with age.  Gender. Men are at higher risk than women before age 20 years. After age 32, women are at higher risk than men. Risk factors you can control include:  Not getting enough exercise or physical activity.  Being overweight.  Getting too much fat, sugar, calories, or salt in your diet.  Drinking too much alcohol. SIGNS AND SYMPTOMS Hypertension does not usually cause signs or symptoms. Extremely high blood pressure (hypertensive crisis) may cause headache, anxiety, shortness of breath, and  nosebleed. DIAGNOSIS  To check if you have hypertension, your health care provider will measure your blood pressure while you are seated, with your arm held at the level of your heart. It should be measured at least twice using the same arm. Certain conditions can cause a difference in blood pressure between your right and left arms. A blood pressure reading that is higher than normal on one occasion does not mean that you need treatment. If one blood pressure reading is high, ask your health care provider about having it checked again. TREATMENT  Treating high blood pressure includes making lifestyle changes and possibly taking medicine. Living a healthy lifestyle can help lower high blood pressure. You may need to change some of your habits. Lifestyle changes may include:  Following the DASH diet. This diet is high in fruits, vegetables, and whole grains. It is low in salt, red meat, and added sugars.  Getting at least 2 hours of brisk physical activity every week.  Losing weight if necessary.  Not smoking.  Limiting alcoholic beverages.  Learning ways to reduce stress. If lifestyle changes are not enough to get your blood pressure under control, your health care provider may prescribe medicine. You may need to take more than one. Work closely with your health care provider to understand the risks and benefits. HOME CARE INSTRUCTIONS  Have your blood pressure rechecked as directed by your health care provider.   Take medicines only as directed by your health care provider. Follow the directions carefully. Blood pressure medicines must be taken as prescribed. The medicine does not  work as well when you skip doses. Skipping doses also puts you at risk for problems.   Do not smoke.   Monitor your blood pressure at home as directed by your health care provider. SEEK MEDICAL CARE IF:   You think you are having a reaction to medicines taken.  You have recurrent headaches or feel  dizzy.  You have swelling in your ankles.  You have trouble with your vision. SEEK IMMEDIATE MEDICAL CARE IF:  You develop a severe headache or confusion.  You have unusual weakness, numbness, or feel faint.  You have severe chest or abdominal pain.  You vomit repeatedly.  You have trouble breathing. MAKE SURE YOU:   Understand these instructions.  Will watch your condition.  Will get help right away if you are not doing well or get worse. Document Released: 08/25/2005 Document Revised: 01/09/2014 Document Reviewed: 06/17/2013 [facility] Patient Information 2015 [place], [place]. This information is not intended to replace advice given to you by your health care provider. Make sure you discuss any questions you have with your health care provider.

## 2014-03-31 NOTE — Addendum Note (Signed)
Addended by: Conni Slipper T on: 03/31/2014 04:57 PM   Modules accepted: Orders

## 2014-03-31 NOTE — Progress Notes (Signed)
Patient ID: Summer Brewer, female   DOB: 11/11/1970, 43 y.o.   MRN: [id number] Subjective:   CC: Med refill, vaginal discharge  HPI:   Medication refill Hypertension Patient is a 42 y.o. female with h/o HTN who was seen in the ED in May for various complaints and started on amlodipine 5mg  in addition to her lisinopril to help control her BP. She does not check BP at home but she has persistent headaches and BP appears to have been poorly controlled since 12/2012 in 170s/90-100s. She denies chest pain or trouble breathing. She reports taking lisinopril daily and now amlodipine daily, with no abnormal side effects. She reports having a hard time seeing her PCP. Eczema She would also like a refill on prednisone which she got in the ED in May for poorly controlled eczema. She states that her triamcinolone does not help for more than 5 minutes at a time and skin is re-flaring up.  Vaginal discharge She also requests refill on metrogel. Patient reports a few days of vaginal discharge with odor. She denies itching, burning, pain, or dyspareunia. She also denies abdominal pain. She is amenable to STD screening today as well.  Review of Systems - Per HPI.   PMH: Poorly controlled HTN since at least 12/2012 in 170s/90-100s. SH: Tobacco use - never smoker    Objective:  Physical Exam BP 152/102  Pulse 81  Temp(Src) 97.4 F (36.3 C) (Oral)  Ht 5\' 7"  (1.702 m)  Wt 254 lb 8 oz (115.44 kg)  BMI 39.85 kg/m2 GEN: NAD CV: RRR, no m/r/g, 2+ bilateral radial pulses PULM: CTAB, normal effort ABD: Soft, nontender, nondistended EXTR: No LE edema or calf tenderness SKIN: Bilateral forearm mildly dry skin with no erythema, induration, purulence or bleeding. GU: mild cervical clear discharge, IUD strings seen, no cervical motion tenderness  Assessment:     Summer Brewer is a 43 y.o. female with h/o HTN, eczema, and anemia here for med refill and with vaginal discharge.    Plan:     Med refill -  Hypertension BP 150s/100s initially, changed to 144/100s on recheck. Chronic. Likely contributing to headache. On lisinopril and amlodipine. - Increase amlodipine to 10mg  daily. - Follow up in next few weeks with Dr Ardelia Mems for further eval. Informed her that if she lets them know up front, they can facilitate making making the appt at Dr Lennie Odor next available 15 min slot to just discuss HTN. - Return precautions including chest pain, dyspnea, or other concerns reviewed. - TSH and BMET today, not checked recently. - Check BP at home 2-3 times over next week and call with these values. - Discussed not taking naproxen daily to prevent both rebound/analgesia-induced headache and persistent HTN.  Vaginal discharge No fevers, chills, pain/burning, or itching. Similar to her previous BV with odor. - Check Wet Prep and GC/Chlamydia. 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 - Will rx metrogel course. - Return precautions reviewed.  Med refill - Eczema Flare up currently, previously improved on prednisone and pt requesting this rx. - Discussed that prednisone is not a safe chronic treatment. - Refilled prednisone at lower dose, 20mg  x 5 days. - Encouraged topical steroid and vaseline use at initial signs.  # Health Maintenance: Needs visit with PCP.  Follow-up: Follow up at Dr Lennie Odor next available for f/u HTN. - At f/u, needs further assessment of headaches.   Hilton Sinclair, MD [place]

## 2014-03-31 NOTE — Assessment & Plan Note (Signed)
Flare up currently, previously improved on prednisone and pt requesting this rx. - Discussed that prednisone is not a safe chronic treatment. - Refilled prednisone at lower dose, 20mg  x 5 days. - Encouraged topical steroid and vaseline use at initial signs.

## 2014-04-01 LAB — TSH: TSH: 1.874 u[IU]/mL (ref 0.350–4.500)

## 2014-04-01 LAB — RPR

## 2014-04-01 LAB — HIV ANTIBODY (ROUTINE TESTING W REFLEX): HIV 1&2 Ab, 4th Generation: NONREACTIVE

## 2014-04-07 ENCOUNTER — Telehealth: Payer: Self-pay | Admitting: Family Medicine

## 2014-04-07 MED ORDER — METRONIDAZOLE 500 MG PO TABS: 500.0000 mg | ORAL_TABLET | Freq: Two times a day (BID) | ORAL | Status: DC

## 2014-04-07 NOTE — Telephone Encounter (Signed)
Patient uninsured and Meterogel is over $200. Patient cannot afford this and would like something send in cheaper. Please call patient once completed.

## 2014-04-07 NOTE — Telephone Encounter (Signed)
LMOVM informing pt that "new rx was sent in for you, please let us know if you have any issues with this". Fleeger, Salome Spotted

## 2014-04-07 NOTE — Telephone Encounter (Signed)
Will forward to MD who saw patient and MD who is covering her box. Jazmin Hartsell,CMA

## 2014-04-07 NOTE — Telephone Encounter (Signed)
It appears this medication was prescribed for BV. I sent a prescription for metronidazole tablets to the pharmacy. Please inform the patient.

## 2014-09-08 HISTORY — PX: NASAL POLYP EXCISION: SHX2068

## 2014-10-04 ENCOUNTER — Other Ambulatory Visit: Payer: Self-pay | Admitting: *Deleted

## 2014-10-05 ENCOUNTER — Other Ambulatory Visit: Payer: Self-pay | Admitting: Family Medicine

## 2014-10-05 MED ORDER — AMLODIPINE BESYLATE 10 MG PO TABS: 10.0000 mg | ORAL_TABLET | Freq: Every day | ORAL | Status: DC

## 2014-10-05 MED ORDER — LISINOPRIL 40 MG PO TABS: 40.0000 mg | ORAL_TABLET | Freq: Every day | ORAL | Status: DC

## 2014-10-05 NOTE — Telephone Encounter (Signed)
Pt called and has been out of her BP medication for three days now. Please call in a refill for her. jw

## 2014-12-07 ENCOUNTER — Other Ambulatory Visit: Payer: Self-pay | Admitting: Family Medicine

## 2014-12-07 NOTE — Telephone Encounter (Signed)
Need refill on prednisone for eczema exacerbation.

## 2014-12-08 ENCOUNTER — Telehealth: Payer: Self-pay | Admitting: Family Medicine

## 2014-12-08 MED ORDER — PREDNISONE 20 MG PO TABS: 20.0000 mg | ORAL_TABLET | Freq: Every day | ORAL | Status: DC

## 2014-12-08 NOTE — Addendum Note (Signed)
Addended by: Leeanne Rio on: 12/08/2014 08:59 AM   Modules accepted: Orders

## 2014-12-08 NOTE — Telephone Encounter (Signed)
Please call pt and let her know I refilled her prednisone for 5 days but if this does not work she will need to be seen in the office before we can prescribe anything else. Also, we will not repeat the prednisone in the future again unless she's seen in the office.  Leeanne Rio, MD

## 2014-12-08 NOTE — Telephone Encounter (Signed)
Pt called back and she was informed. Deseree Kennon Holter, CMA

## 2014-12-08 NOTE — Telephone Encounter (Signed)
Tried to call pt and leave a message, mailbox full. Will try again later. Chimaobi Casebolt Kennon Holter, CMA

## 2014-12-12 ENCOUNTER — Emergency Department (HOSPITAL_BASED_OUTPATIENT_CLINIC_OR_DEPARTMENT_OTHER)
Admission: EM | Admit: 2014-12-12 | Discharge: 2014-12-13 | Disposition: A | Discharge status: Home / Self Care | Payer: Medicaid Other | Attending: Emergency Medicine | Admitting: Emergency Medicine

## 2014-12-12 ENCOUNTER — Encounter (HOSPITAL_BASED_OUTPATIENT_CLINIC_OR_DEPARTMENT_OTHER): Payer: Self-pay | Admitting: Emergency Medicine

## 2014-12-12 DIAGNOSIS — Z872 Personal history of diseases of the skin and subcutaneous tissue: Secondary | ICD-10-CM | POA: Insufficient documentation

## 2014-12-12 DIAGNOSIS — Z8719 Personal history of other diseases of the digestive system: Secondary | ICD-10-CM | POA: Insufficient documentation

## 2014-12-12 DIAGNOSIS — N390 Urinary tract infection, site not specified: Secondary | ICD-10-CM

## 2014-12-12 DIAGNOSIS — Z91040 Latex allergy status: Secondary | ICD-10-CM | POA: Insufficient documentation

## 2014-12-12 DIAGNOSIS — Z7952 Long term (current) use of systemic steroids: Secondary | ICD-10-CM | POA: Insufficient documentation

## 2014-12-12 DIAGNOSIS — Z79899 Other long term (current) drug therapy: Secondary | ICD-10-CM | POA: Insufficient documentation

## 2014-12-12 DIAGNOSIS — I10 Essential (primary) hypertension: Secondary | ICD-10-CM | POA: Insufficient documentation

## 2014-12-12 LAB — URINALYSIS, ROUTINE W REFLEX MICROSCOPIC
GLUCOSE, UA: NEGATIVE mg/dL
KETONES UR: 15 mg/dL — AB
Nitrite: POSITIVE — AB
Protein, ur: 100 mg/dL — AB
Specific Gravity, Urine: 1.033 — ABNORMAL HIGH (ref 1.005–1.030)
Urobilinogen, UA: 2 mg/dL — ABNORMAL HIGH (ref 0.0–1.0)
pH: 5 (ref 5.0–8.0)

## 2014-12-12 LAB — URINE MICROSCOPIC-ADD ON

## 2014-12-12 MED ORDER — CEPHALEXIN 500 MG PO CAPS: 500.0000 mg | ORAL_CAPSULE | Freq: Four times a day (QID) | ORAL | Status: DC

## 2014-12-12 MED ORDER — CEPHALEXIN 250 MG PO CAPS
500.0000 mg | ORAL_CAPSULE | Freq: Once | ORAL | Status: AC
  Administered 2014-12-12: 500 mg via ORAL
  Filled 2014-12-12: qty 2

## 2014-12-12 NOTE — ED Notes (Signed)
Urinary frequency and suprapubic pressure x3 days.  Hx of UTI.

## 2014-12-12 NOTE — Discharge Instructions (Signed)
Urinary Tract Infection Urinary tract infections (UTIs) can develop anywhere along your urinary tract. Your urinary tract is your body's drainage system for removing wastes and extra water. Your urinary tract includes two kidneys, two ureters, a bladder, and a urethra. Your kidneys are a pair of bean-shaped organs. Each kidney is about the size of your fist. They are located below your ribs, one on each side of your spine. CAUSES Infections are caused by microbes, which are microscopic organisms, including fungi, viruses, and bacteria. These organisms are so small that they can only be seen through a microscope. Bacteria are the microbes that most commonly cause UTIs. SYMPTOMS  Symptoms of UTIs may vary by age and gender of the patient and by the location of the infection. Symptoms in young women typically include a frequent and intense urge to urinate and a painful, burning feeling in the bladder or urethra during urination. Older women and men are more likely to be tired, shaky, and weak and have muscle aches and abdominal pain. A fever may mean the infection is in your kidneys. Other symptoms of a kidney infection include pain in your back or sides below the ribs, nausea, and vomiting. DIAGNOSIS To diagnose a UTI, your caregiver will ask you about your symptoms. Your caregiver also will ask to provide a urine sample. The urine sample will be tested for bacteria and white blood cells. White blood cells are made by your body to help fight infection. TREATMENT  Typically, UTIs can be treated with medication. Because most UTIs are caused by a bacterial infection, they usually can be treated with the use of antibiotics. The choice of antibiotic and length of treatment depend on your symptoms and the type of bacteria causing your infection. HOME CARE INSTRUCTIONS  If you were prescribed antibiotics, take them exactly as your caregiver instructs you. Finish the medication even if you feel better after you  have only taken some of the medication.  Drink enough water and fluids to keep your urine clear or pale yellow.  Avoid caffeine, tea, and carbonated beverages. They tend to irritate your bladder.  Empty your bladder often. Avoid holding urine for long periods of time.  Empty your bladder before and after sexual intercourse.  After a bowel movement, women should cleanse from front to back. Use each tissue only once. SEEK MEDICAL CARE IF:   You have back pain.  You develop a fever.  Your symptoms do not begin to resolve within 3 days. SEEK IMMEDIATE MEDICAL CARE IF:   You have severe back pain or lower abdominal pain.  You develop chills.  You have nausea or vomiting.  You have continued burning or discomfort with urination. MAKE SURE YOU:   Understand these instructions.  Will watch your condition.  Will get help right away if you are not doing well or get worse. Document Released: 06/04/2005 Document Revised: 02/24/2012 Document Reviewed: 10/03/2011 [facility] Patient Information 2015 [place], [place]. This information is not intended to replace advice given to you by your health care provider. Make sure you discuss any questions you have with your health care provider.  Cephalexin tablets or capsules What is this medicine? CEPHALEXIN (sef a LEX in) is a cephalosporin antibiotic. It is used to treat certain kinds of bacterial infections It will not work for colds, flu, or other viral infections. This medicine may be used for other purposes; ask your health care provider or pharmacist if you have questions. COMMON BRAND NAME(S): Biocef, Keflex, Keftab What should I  tell my health care provider before I take this medicine? They need to know if you have any of these conditions: -kidney disease -stomach or intestine problems, especially colitis -an unusual or allergic reaction to cephalexin, other cephalosporins, penicillins, other antibiotics, medicines, foods, dyes or  preservatives -pregnant or trying to get pregnant -breast-feeding How should I use this medicine? Take this medicine by mouth with a full glass of water. Follow the directions on the prescription label. This medicine can be taken with or without food. Take your medicine at regular intervals. Do not take your medicine more often than directed. Take all of your medicine as directed even if you think you are better. Do not skip doses or stop your medicine early. Talk to your pediatrician regarding the use of this medicine in children. While this drug may be prescribed for selected conditions, precautions do apply. Overdosage: If you think you have taken too much of this medicine contact a poison control center or emergency room at once. NOTE: This medicine is only for you. Do not share this medicine with others. What if I miss a dose? If you miss a dose, take it as soon as you can. If it is almost time for your next dose, take only that dose. Do not take double or extra doses. There should be at least 4 to 6 hours between doses. What may interact with this medicine? -probenecid -some other antibiotics This list may not describe all possible interactions. Give your health care provider a list of all the medicines, herbs, non-prescription drugs, or dietary supplements you use. Also tell them if you smoke, drink alcohol, or use illegal drugs. Some items may interact with your medicine. What should I watch for while using this medicine? Tell your doctor or health care professional if your symptoms do not begin to improve in a few days. Do not treat diarrhea with over the counter products. Contact your doctor if you have diarrhea that lasts more than 2 days or if it is severe and watery. If you have diabetes, you may get a false-positive result for sugar in your urine. Check with your doctor or health care professional. What side effects may I notice from receiving this medicine? Side effects that you  should report to your doctor or health care professional as soon as possible: -allergic reactions like skin rash, itching or hives, swelling of the face, lips, or tongue -breathing problems -pain or trouble passing urine -redness, blistering, peeling or loosening of the skin, including inside the mouth -severe or watery diarrhea -unusually weak or tired -yellowing of the eyes, skin Side effects that usually do not require medical attention (report to your doctor or health care professional if they continue or are bothersome): -gas or heartburn -genital or anal irritation -headache -joint or muscle pain -nausea, vomiting This list may not describe all possible side effects. Call your doctor for medical advice about side effects. You may report side effects to FDA at 1-800-FDA-1088. Where should I keep my medicine? Keep out of the reach of children. Store at room temperature between 59 and 86 degrees F (15 and 30 degrees C). Throw away any unused medicine after the expiration date. NOTE: This sheet is a summary. It may not cover all possible information. If you have questions about this medicine, talk to your doctor, pharmacist, or health care provider.  2015, Elsevier/Gold Standard. (2007-11-29 17:09:13)

## 2014-12-12 NOTE — ED Provider Notes (Signed)
CSN: [id number]     Arrival date & time 12/12/14  2033 History  This chart was scribed for Delora Fuel, MD by Tula Nakayama, ED Scribe. This patient was seen in room MH11/MH11 and the patient's care was started at 11:45 PM.    Chief Complaint  Patient presents with  . Urinary Frequency   The history is provided by the patient. No language interpreter was used.   HPI Comments: Summer Brewer is a 44 y.o. female who presents to the Emergency Department complaining of constant, moderate suprapubic pressure that started 3 days ago. She states lower back pain, urgency and urinary frequency as an associated symptoms. Pt has a history of UTI and states current symptoms feel similar. She denies nausea, vomiting, fever and chills as associated symptoms.   PCP Ardelia Mems  Past Medical History  Diagnosis Date  . Back pain   . Hypertension   . GERD (gastroesophageal reflux disease)   . Eczema    Past Surgical History  Procedure Laterality Date  . Tubal ligation    . Cesarean section     No family history on file. History  Substance Use Topics  . Smoking status: Never Smoker   . Smokeless tobacco: Not on file  . Alcohol Use: No   OB History    No data available     Review of Systems  Constitutional: Negative for fever and chills.  Gastrointestinal: Positive for abdominal pain. Negative for nausea and vomiting.  Genitourinary: Positive for urgency and frequency.  Musculoskeletal: Positive for back pain.  All other systems reviewed and are negative.   Allergies  Latex  Home Medications   Prior to Admission medications   Medication Sig Start Date End Date Taking? Authorizing Provider  amLODipine (NORVASC) 10 MG tablet Take 1 tablet (10 mg total) by mouth daily. 10/05/14   Leeanne Rio, MD  lisinopril (PRINIVIL,ZESTRIL) 40 MG tablet Take 1 tablet (40 mg total) by mouth daily. 10/05/14   Leeanne Rio, MD  metroNIDAZOLE (FLAGYL) 500 MG tablet Take 1 tablet (500 mg  total) by mouth 2 (two) times daily. 04/07/14   Leone Haven, MD  metroNIDAZOLE (METROGEL) 0.75 % vaginal gel Place vaginally at bedtime. Use for 5 days then stop 03/31/14   Hilton Sinclair, MD  naproxen (NAPROSYN) 500 MG tablet Take 1 tablet (500 mg total) by mouth 2 (two) times daily with a meal. 09/23/13   Olin Hauser, DO  predniSONE (DELTASONE) 20 MG tablet Take 1 tablet (20 mg total) by mouth daily. 12/08/14   Leeanne Rio, MD  triamcinolone ointment (KENALOG) 0.5 % Apply topically 2 (two) times daily. 01/13/14   Blanchie Dessert, MD   BP 165/103 mmHg  Pulse 102  Temp(Src) 97.7 F (36.5 C) (Oral)  Resp 16  Ht 5' 6.5" (1.689 m)  Wt 243 lb (110.224 kg)  BMI 38.64 kg/m2  SpO2 98% Physical Exam  Constitutional: She is oriented to person, place, and time. She appears well-developed and well-nourished. No distress.  HENT:  Head: Normocephalic and atraumatic.  Eyes: Conjunctivae and EOM are normal. Pupils are equal, round, and reactive to light.  Neck: Normal range of motion. Neck supple. No JVD present.  Cardiovascular: Normal rate, regular rhythm and normal heart sounds.   No murmur heard. Pulmonary/Chest: Effort normal and breath sounds normal. She has no wheezes. She has no rales. She exhibits no tenderness.  Abdominal: Soft. Bowel sounds are normal. She exhibits no distension and no mass. There is  no tenderness.  Musculoskeletal: Normal range of motion. She exhibits no edema.  Lymphadenopathy:    She has no cervical adenopathy.  Neurological: She is alert and oriented to person, place, and time. She has normal reflexes. She exhibits normal muscle tone. Coordination normal.  Skin: Skin is warm and dry. No rash noted.  Psychiatric: She has a normal mood and affect. Her behavior is normal. Judgment and thought content normal.  Nursing note and vitals reviewed.   ED Course  Procedures   DIAGNOSTIC STUDIES: Oxygen Saturation is 98% on RA, normal by my  interpretation.    COORDINATION OF CARE: 11:46 PM Discussed UA results and treatment plan with pt which includes Keflex. Pt agreed to plan.   Labs Review Results for orders placed or performed during the hospital encounter of 12/12/14  Urinalysis, Routine w reflex microscopic  Result Value Ref Range   Color, Urine ORANGE (A) YELLOW   APPearance TURBID (A) CLEAR   Specific Gravity, Urine 1.033 (H) 1.005 - 1.030   pH 5.0 5.0 - 8.0   Glucose, UA NEGATIVE NEGATIVE mg/dL   Hgb urine dipstick LARGE (A) NEGATIVE   Bilirubin Urine SMALL (A) NEGATIVE   Ketones, ur 15 (A) NEGATIVE mg/dL   Protein, ur 100 (A) NEGATIVE mg/dL   Urobilinogen, UA 2.0 (H) 0.0 - 1.0 mg/dL   Nitrite POSITIVE (A) NEGATIVE   Leukocytes, UA LARGE (A) NEGATIVE  Urine microscopic-add on  Result Value Ref Range   Squamous Epithelial / LPF FEW (A) RARE   WBC, UA TOO NUMEROUS TO COUNT <3 WBC/hpf   RBC / HPF 11-20 <3 RBC/hpf   Bacteria, UA FEW (A) RARE    MDM   Final diagnoses:  Urinary tract infection without hematuria, site unspecified    Urinary tract infection. She is discharged with prescription for cephalexin. Urine has been sent for culture.  I personally performed the services described in this documentation, which was scribed in my presence. The recorded information has been reviewed and is accurate.      Delora Fuel, MD 32/35/57 [id number]

## 2014-12-15 LAB — URINE CULTURE: Colony Count: >=100000

## 2014-12-18 ENCOUNTER — Telehealth (HOSPITAL_COMMUNITY): Payer: Self-pay

## 2014-12-18 NOTE — Telephone Encounter (Signed)
Post ED Visit - Positive Culture Follow-up  Culture report reviewed by antimicrobial stewardship pharmacist: []  Wes Dulaney, Pharm.D., BCPS []  Heide Guile, Pharm.D., BCPS [x]  Alycia Rossetti, Pharm.D., BCPS []  [place], Pharm.D., BCPS, AAHIVP []  Legrand Como, Pharm.D., BCPS, AAHIVP []  Isac Sarna, Pharm.D., BCPS  Positive Urine culture, >/= 100,000 colonies -> E Coli Treated with Cephalexin, organism sensitive to the same and no further patient follow-up is required at this time.  Dortha Kern 12/18/2014, 9:59 PM

## 2014-12-22 ENCOUNTER — Ambulatory Visit (INDEPENDENT_AMBULATORY_CARE_PROVIDER_SITE_OTHER): Payer: Medicaid Other | Admitting: Family Medicine

## 2014-12-22 ENCOUNTER — Ambulatory Visit: Payer: Medicaid Other | Admitting: Family Medicine

## 2014-12-22 VITALS — BP 150/120 | HR 86 | Temp 98.2°F | Ht 66.5 in | Wt 264.7 lb

## 2014-12-22 DIAGNOSIS — F419 Anxiety disorder, unspecified: Secondary | ICD-10-CM | POA: Insufficient documentation

## 2014-12-22 DIAGNOSIS — L309 Dermatitis, unspecified: Secondary | ICD-10-CM

## 2014-12-22 DIAGNOSIS — I10 Essential (primary) hypertension: Secondary | ICD-10-CM

## 2014-12-22 DIAGNOSIS — F411 Generalized anxiety disorder: Secondary | ICD-10-CM

## 2014-12-22 MED ORDER — HYDROCHLOROTHIAZIDE 25 MG PO TABS: 25.0000 mg | ORAL_TABLET | Freq: Every day | ORAL | Status: DC

## 2014-12-22 MED ORDER — LISINOPRIL 40 MG PO TABS: 40.0000 mg | ORAL_TABLET | Freq: Every day | ORAL | Status: DC

## 2014-12-22 MED ORDER — SERTRALINE HCL 50 MG PO TABS: 50.0000 mg | ORAL_TABLET | Freq: Every day | ORAL | Status: DC

## 2014-12-22 NOTE — Progress Notes (Signed)
Patient ID: Summer Brewer, female   DOB: 24-Feb-1971, 44 y.o.   MRN: [id number]  HPI:  Hypertension: Currently taking amlodipine 10 mg daily. Does not like this medication as it gives her headache. Reports that she has intermittent headache and blurry vision, which she attributes to having elevated blood pressure. She is not taking lisinopril at all at this time, thinks this was discontinued by a prior provider, although there is no documentation of this in the chart. She denies chest pain or shortness of breath. Occasionally has a sharp pain in her chest which is not worse with exertion and spontaneously resolves. Has noted some swelling in her hands and feet.  Eczema: I recently sent in a prescription for a 5 day course of prednisone for her. She takes this maybe 1-2 times a year if she has very bad eczema flares. She uses triamcinolone in the meantime as needed. Also uses lotion daily.  Stress: Reports being very stressed. Recently was laid off from work. Also is engaged and planning a wedding. Feels overwhelmed with stress. Denies any thoughts of harming herself or others. Has never taken any medication for mood or anxiety in the past. Denies any prior hospitalizations for psychiatric issues.  ROS: See HPI.  [place]: Hypertension, GERD, eczema, anemia, menorrhagia, IUD in place  PHYSICAL EXAM: BP 150/120 mmHg  Pulse 86  Temp(Src) 98.2 F (36.8 C) (Oral)  Ht 5' 6.5" (1.689 m)  Wt 264 lb 11.2 oz (120.067 kg)  BMI 42.09 kg/m2 Gen: No acute distress, pleasant, cooperative HEENT: Normocephalic, atraumatic Heart: Regular rate and rhythm, no murmur Lungs: Clear to auscultation bilaterally, normal respiratory effort Neuro: Grossly nonfocal, speech normal Ext: 2+ DP pulses bilaterally, no lower extremity edema appreciable Psych: Affect is normal range, normal eye contact, speech normal in rate and volume, well-groomed  ASSESSMENT/PLAN:  HYPERTENSION, BENIGN SYSTEMIC BP elevated. Does not  like amlodipine. Plan: Resume lisinopril 40 mg daily and HCTZ 25 mg daily. This is the previous regimen she was on in the past. Follow-up in one week to recheck her blood pressure. We'll get a BMET at that time. Anticipate we'll need to add additional medication to control her BP, but I want to see what she does on these medications before adding additional medicines. Patient is agreeable with this plan.   Eczema Stable. Discussed the importance of avoiding chronic prednisone use and taking prednisone only very rarely if she has a particularly bad flare. This is not standard treatment for eczema flares. She is agreeable to this. No medication changes today.   Anxiety state Situational stress and anxiety.  PHQ9 score is 9 with rating of not difficult at all. GAD 7 score 10, not difficult at all  MDQ negative for prior bipolar illness  Plan: Start Zoloft 50 mg daily. Discussed possible side effects and adverse effects with patient. She will follow-up with me in 1-2 weeks to see how she's doing on this medication. Discussed that she may not have any benefit and until 4-6 weeks from now.    FOLLOW UP: F/u in 1-2 weeks for BP and anxiety  [place] J. Ardelia Mems, [place]

## 2014-12-22 NOTE — Assessment & Plan Note (Signed)
Situational stress and anxiety.  PHQ9 score is 9 with rating of not difficult at all. GAD 7 score 10, not difficult at all  MDQ negative for prior bipolar illness  Plan: Start Zoloft 50 mg daily. Discussed possible side effects and adverse effects with patient. She will follow-up with me in 1-2 weeks to see how she's doing on this medication. Discussed that she may not have any benefit and until 4-6 weeks from now.

## 2014-12-22 NOTE — Assessment & Plan Note (Signed)
Stable. Discussed the importance of avoiding chronic prednisone use and taking prednisone only very rarely if she has a particularly bad flare. This is not standard treatment for eczema flares. She is agreeable to this. No medication changes today.

## 2014-12-22 NOTE — Assessment & Plan Note (Signed)
BP elevated. Does not like amlodipine. Plan: Resume lisinopril 40 mg daily and HCTZ 25 mg daily. This is the previous regimen she was on in the past. Follow-up in one week to recheck her blood pressure. We'll get a BMET at that time. Anticipate we'll need to add additional medication to control her BP, but I want to see what she does on these medications before adding additional medicines. Patient is agreeable with this plan.

## 2014-12-22 NOTE — Patient Instructions (Signed)
Stop amlodipine Take HCTZ 25mg  daily and lisinopril 40mg  daily Start zoloft 50mg  daily Follow up with me in 1 week to see how your mood/stress and BP are doing. We'll check bloodwork at that visit.  Be well, Dr. Ardelia Mems

## 2014-12-25 NOTE — Progress Notes (Signed)
I was preceptor the day of this visit.   

## 2015-01-02 ENCOUNTER — Encounter: Payer: Self-pay | Admitting: Family Medicine

## 2015-01-02 ENCOUNTER — Ambulatory Visit: Payer: Medicaid Other | Admitting: Family Medicine

## 2015-01-02 ENCOUNTER — Ambulatory Visit (INDEPENDENT_AMBULATORY_CARE_PROVIDER_SITE_OTHER): Payer: Self-pay | Admitting: Family Medicine

## 2015-01-02 ENCOUNTER — Ambulatory Visit: Payer: Self-pay | Admitting: Family Medicine

## 2015-01-02 VITALS — BP 135/86 | HR 91 | Temp 98.5°F | Ht 66.5 in | Wt 262.7 lb

## 2015-01-02 DIAGNOSIS — I10 Essential (primary) hypertension: Secondary | ICD-10-CM

## 2015-01-02 DIAGNOSIS — F411 Generalized anxiety disorder: Secondary | ICD-10-CM

## 2015-01-02 LAB — BASIC METABOLIC PANEL
BUN: 10 mg/dL (ref 6–23)
CHLORIDE: 102 meq/L (ref 96–112)
CO2: 26 meq/L (ref 19–32)
Calcium: 9.4 mg/dL (ref 8.4–10.5)
Creat: 0.81 mg/dL (ref 0.50–1.10)
GLUCOSE: 87 mg/dL (ref 70–99)
POTASSIUM: 3.9 meq/L (ref 3.5–5.3)
SODIUM: 137 meq/L (ref 135–145)

## 2015-01-02 MED ORDER — LISINOPRIL 40 MG PO TABS: 40.0000 mg | ORAL_TABLET | Freq: Every day | ORAL | Status: DC

## 2015-01-02 MED ORDER — HYDROCHLOROTHIAZIDE 25 MG PO TABS: 25.0000 mg | ORAL_TABLET | Freq: Every day | ORAL | Status: DC

## 2015-01-02 MED ORDER — SERTRALINE HCL 50 MG PO TABS: 50.0000 mg | ORAL_TABLET | Freq: Every day | ORAL | Status: DC

## 2015-01-02 NOTE — Patient Instructions (Signed)
Your blood pressure is great today. Continue current medicines Follow up with me in 1 month to see how the zoloft is doing. I sent in refills. I will call you if your test results are not normal.  Otherwise, I will send you a letter.  If you do not hear from me with in 2 weeks please call our office.      Be well, Dr. Ardelia Mems

## 2015-01-03 ENCOUNTER — Encounter: Payer: Self-pay | Admitting: Family Medicine

## 2015-01-05 NOTE — Assessment & Plan Note (Signed)
Well controlled. Continue current regimen. Check BMET today. F/u in 3 mos.

## 2015-01-05 NOTE — Progress Notes (Signed)
Patient ID: Summer Brewer, female   DOB: Apr 19, 1971, 44 y.o.   MRN: [id number] Date of Visit: 01/02/2015    HPI:  Follow-up hypertension: Currently taking lisinopril 40 mg daily and HCTZ 25 mg daily. Denies chest pain, shortness of breath, or swelling. Last week fell like she had low energy and felt tired, but has improved and is now feeling well.  Anxiety: Currently taking Zoloft 50 mg daily. Denies SI or HI. Has not noticed any improvement on this yet. Tolerating well without side effects.  ROS: See HPI.  [place]: Hypertension, GERD, anxiety  PHYSICAL EXAM: BP 135/86 mmHg  Pulse 91  Temp(Src) 98.5 F (36.9 C) (Oral)  Ht 5' 6.5" (1.689 m)  Wt 262 lb 11.2 oz (119.16 kg)  BMI 41.77 kg/m2 Gen: No acute distress, pleasant, cooperative HEENT: Normocephalic, atraumatic Heart: Regular rate and rhythm, no murmur Lungs: Clear to auscultation bilaterally, normal respiratory effort Neuro: Grossly nonfocal, speech normal Ext: No appreciable lower extremity edema bilaterally Psych: Full range of affect, speech normal in rate and volume, normal eye contact, well-groomed  ASSESSMENT/PLAN:  HYPERTENSION, BENIGN SYSTEMIC Well controlled. Continue current regimen. Check BMET today. F/u in 3 mos.   Anxiety state Tolerating Zoloft well. No unwanted effects. Follow-up in one month to evaluate need for titration.    FOLLOW UP: F/u in one month for anxiety  [place] J. Ardelia Mems, [place]

## 2015-01-05 NOTE — Progress Notes (Signed)
I was the preceptor on the day of this visit.   Kyle Fletke MD  

## 2015-01-05 NOTE — Assessment & Plan Note (Signed)
Tolerating Zoloft well. No unwanted effects. Follow-up in one month to evaluate need for titration.

## 2015-02-09 ENCOUNTER — Ambulatory Visit: Payer: Self-pay | Admitting: Family Medicine

## 2015-02-12 ENCOUNTER — Ambulatory Visit: Payer: Self-pay | Admitting: Family Medicine

## 2015-02-13 ENCOUNTER — Ambulatory Visit (INDEPENDENT_AMBULATORY_CARE_PROVIDER_SITE_OTHER): Payer: Self-pay | Admitting: Family Medicine

## 2015-02-13 ENCOUNTER — Encounter: Payer: Self-pay | Admitting: Family Medicine

## 2015-02-13 VITALS — BP 157/88 | HR 79 | Temp 98.6°F | Ht 66.5 in | Wt 259.0 lb

## 2015-02-13 DIAGNOSIS — B9789 Other viral agents as the cause of diseases classified elsewhere: Principal | ICD-10-CM

## 2015-02-13 DIAGNOSIS — J069 Acute upper respiratory infection, unspecified: Secondary | ICD-10-CM

## 2015-02-13 MED ORDER — IPRATROPIUM BROMIDE 0.06 % NA SOLN: 2.0000 | Freq: Four times a day (QID) | NASAL | Status: DC

## 2015-02-13 NOTE — Patient Instructions (Signed)
Good to see you today. This sounds like a viral upper respiratory infection with a cough. Be sure to continue drinking plenty of warm fluids and getting lots of rest. Use the Atrovent nasal spray every day to help with congestion. You can also use nasal saline sprays that you can buy over-the-counter. Come back every October for a flu shot. Follow-up in 1 week if symptoms are not significantly improving. Seek immediate care if you ever have any trouble breathing or staying hydrated.   Hilton Sinclair, MD  Viral Infections A viral infection can be caused by different types of viruses.Most viral infections are not serious and resolve on their own. However, some infections may cause severe symptoms and may lead to further complications. SYMPTOMS Viruses can frequently cause:  Minor sore throat.  Aches and pains.  Headaches.  Runny nose.  Different types of rashes.  Watery eyes.  Tiredness.  Cough.  Loss of appetite.  Gastrointestinal infections, resulting in nausea, vomiting, and diarrhea. These symptoms do not respond to antibiotics because the infection is not caused by bacteria. However, you might catch a bacterial infection following the viral infection. This is sometimes called a "superinfection." Symptoms of such a bacterial infection may include:  Worsening sore throat with pus and difficulty swallowing.  Swollen neck glands.  Chills and a high or persistent fever.  Severe headache.  Tenderness over the sinuses.  Persistent overall ill feeling (malaise), muscle aches, and tiredness (fatigue).  Persistent cough.  Yellow, green, or brown mucus production with coughing. HOME CARE INSTRUCTIONS   Only take over-the-counter or prescription medicines for pain, discomfort, diarrhea, or fever as directed by your caregiver.  Drink enough water and fluids to keep your urine clear or pale yellow. Sports drinks can provide valuable electrolytes, sugars, and  hydration.  Get plenty of rest and maintain proper nutrition. Soups and broths with crackers or rice are fine. SEEK IMMEDIATE MEDICAL CARE IF:   You have severe headaches, shortness of breath, chest pain, neck pain, or an unusual rash.  You have uncontrolled vomiting, diarrhea, or you are unable to keep down fluids.  You or your child has an oral temperature above 102 F (38.9 C), not controlled by medicine.  Your baby is older than 3 months with a rectal temperature of 102 F (38.9 C) or higher.  Your baby is 65 months old or younger with a rectal temperature of 100.4 F (38 C) or higher. MAKE SURE YOU:   Understand these instructions.  Will watch your condition.  Will get help right away if you are not doing well or get worse. Document Released: 06/04/2005 Document Revised: 11/17/2011 Document Reviewed: 12/30/2010 [facility] Patient Information 2015 [place], [place]. This information is not intended to replace advice given to you by your health care provider. Make sure you discuss any questions you have with your health care provider.

## 2015-02-13 NOTE — Progress Notes (Signed)
Patient ID: Summer Brewer, female   DOB: [birth year], 44 y.o.   MRN: [id number] Subjective:   CC: Cold symptoms  HPI:   Patient presents to same-day clinic with cold symptoms that have been present for about one week. She initially had sore throat, rhinorrhea, cough, and congestion. Sore throat has resolved but all others are still present and worse at night. She denies shortness of breath, abdominal pain, or vomiting but has had nasal and chest congestion, fever 3 days ago to 101F, malaise, headaches, body aches, decreased appetite, and 2 episodes of diarrhea last week. She denies known sick contacts or bug bites. She works for [employer] and is unable to go back to work until she is no longer contagious. She tried an over-the-counter TheraFlu, NyQuil, and Sudafed which improved her sore throat. She is also no longer febrile. Normal liquid intake.  Review of Systems - Per HPI.  PMH-obesity, anemia, hypertension, lumbar back pain, GERD, menorrhagia, dysmenorrhea, eczema, anxiety    Objective:  Physical Exam BP 157/88 mmHg  Pulse 79  Temp(Src) 98.6 F (37 C) (Oral)  Ht 5' 6.5" (1.689 m)  Wt 259 lb (117.482 kg)  BMI 41.18 kg/m2 GEN: NAD Cardiovascular: Regular rate and rhythm, no murmurs rubs or gallops Pulmonary: Clear to auscultation bilaterally with normal effort and no wheezes or crackles Abdomen: Soft, nontender, nondistended Extremities: No lower extremity edema or calf tenderness HEENT: Atraumatic, normocephalic, sclera clear, extraocular movements intact, TMs with clear retraction bilaterally, nares patent, oropharynx clear with moist mucous membranes and a small 2 mm white spot of purulence on her left tonsillar pillar, neck supple, no lymphadenopathy    Assessment:     Summer Brewer is a 44 y.o. female here for URI symptoms.    Plan:     # See problem list and after visit summary for problem-specific plans.   # Health Maintenance: Return every October for flu  shot.  Follow-up: Follow up in 1 week if symptoms are not significantly improved or immediately if any severe worsening of symptoms, difficulty breathing, difficulty staying hydrated.   Hilton Sinclair, MD [place]

## 2015-02-14 DIAGNOSIS — J069 Acute upper respiratory infection, unspecified: Secondary | ICD-10-CM | POA: Insufficient documentation

## 2015-02-14 DIAGNOSIS — B9789 Other viral agents as the cause of diseases classified elsewhere: Principal | ICD-10-CM

## 2015-02-14 NOTE — Assessment & Plan Note (Signed)
Most likely viral URI with cough and likely postnasal drip. No signs of strep throat on exam. Patient is breathing and hydrating well. Afebrile with benign exam in clinic. -Atrovent nasal spray PRN -Also suggested nasal saline spray, warm fluids, lots of rest -Come back every October for a flu shot. -Follow-up in 1 week if symptoms are not significantly improving. Return precautions reviewed.

## 2015-03-19 ENCOUNTER — Ambulatory Visit: Payer: Self-pay | Admitting: Family Medicine

## 2015-03-20 ENCOUNTER — Ambulatory Visit: Payer: Self-pay | Admitting: Family Medicine

## 2015-03-23 ENCOUNTER — Ambulatory Visit: Payer: Self-pay | Admitting: Family Medicine

## 2015-06-14 ENCOUNTER — Ambulatory Visit (INDEPENDENT_AMBULATORY_CARE_PROVIDER_SITE_OTHER): Payer: BLUE CROSS/BLUE SHIELD | Admitting: Family Medicine

## 2015-06-14 VITALS — BP 168/115 | HR 79 | Temp 99.1°F | Wt 258.0 lb

## 2015-06-14 DIAGNOSIS — I10 Essential (primary) hypertension: Secondary | ICD-10-CM | POA: Diagnosis not present

## 2015-06-14 DIAGNOSIS — B9689 Other specified bacterial agents as the cause of diseases classified elsewhere: Secondary | ICD-10-CM

## 2015-06-14 DIAGNOSIS — J019 Acute sinusitis, unspecified: Secondary | ICD-10-CM

## 2015-06-14 MED ORDER — AMOXICILLIN-POT CLAVULANATE 875-125 MG PO TABS: 1.0000 | ORAL_TABLET | Freq: Two times a day (BID) | ORAL | Status: DC

## 2015-06-14 NOTE — Patient Instructions (Signed)
Thank you for coming in today! - Take augmentin twice a day for 10 days in addition to tylenol, netty pot, and flonase.  - Return if symptoms are still present after full treatment or if you have trouble breathing.   Our clinic's number is [phone]. Feel free to call any time with questions or concerns. We will answer any questions after hours with our 24-hour emergency line at that number as well.   - Dr. Bonner Puna

## 2015-06-14 NOTE — Progress Notes (Signed)
Subjective: Summer Brewer is a 44 y.o. female presenting for nasal discharge, congestion, headache.  She endorses worsening severe congestion for the past several weeks worsening despite supportive treatments, purulent nasal discharge, throat irritation without cough anda headache. Headache is global without photophobia and no neck stiffness. She has tried sudafed (today), tylenol, tylenol cold and sinus. she does not smoke and denies sick contacts.   - ROS: +subjective fevers. No eye pain, redness, discharge, vision change, ear pain, discharge, change in hearing. No cough, dyspnea, chest pain, N/V/D, arthralgias, myalgias, rashes.   Objective: BP 168/115 mmHg  Pulse 79  Temp(Src) 99.1 F (37.3 C) (Oral)  Wt 258 lb (117.028 kg) Gen:  44 y.o. female in no distress HEENT: Conjunctivae normal, TMs normal, no drainage, nares erythematous and boggy with purulent drainage, moist mucous membranes, posterior oropharynx clear and erythematous, normal dentition Pulm: Non-labored breathing ambient air; CTAB, no wheezes or crackles CV: Regular rate, no murmur  Assessment/Plan: Summer Brewer is a 44 y.o. female here for bacterial rinosinusitis.    Acute bacterial sinusitis Due to duration, severity, purulence will treat for bacterial sinusitis with augmentin (no major risk factors for resistance). Continue supportive measures as well.   HYPERTENSION, BENIGN SYSTEMIC High BP today likely SE of sudafed. Recommend recheck in the future. No symptoms.

## 2015-06-15 ENCOUNTER — Encounter: Payer: Self-pay | Admitting: Family Medicine

## 2015-06-15 NOTE — Assessment & Plan Note (Addendum)
Due to duration, severity, purulence will treat for bacterial sinusitis with augmentin (no major risk factors for resistance). Continue supportive measures as well.

## 2015-06-15 NOTE — Assessment & Plan Note (Signed)
High BP today likely SE of sudafed. Recommend recheck in the future. No symptoms.

## 2015-07-13 ENCOUNTER — Emergency Department (HOSPITAL_BASED_OUTPATIENT_CLINIC_OR_DEPARTMENT_OTHER)
Admission: EM | Admit: 2015-07-13 | Discharge: 2015-07-13 | Disposition: A | Discharge status: Home / Self Care | Payer: BLUE CROSS/BLUE SHIELD | Attending: Emergency Medicine | Admitting: Emergency Medicine

## 2015-07-13 ENCOUNTER — Encounter (HOSPITAL_BASED_OUTPATIENT_CLINIC_OR_DEPARTMENT_OTHER): Payer: Self-pay | Admitting: *Deleted

## 2015-07-13 DIAGNOSIS — K429 Umbilical hernia without obstruction or gangrene: Secondary | ICD-10-CM | POA: Diagnosis not present

## 2015-07-13 DIAGNOSIS — Z8719 Personal history of other diseases of the digestive system: Secondary | ICD-10-CM | POA: Insufficient documentation

## 2015-07-13 DIAGNOSIS — I10 Essential (primary) hypertension: Secondary | ICD-10-CM

## 2015-07-13 DIAGNOSIS — Z3202 Encounter for pregnancy test, result negative: Secondary | ICD-10-CM | POA: Insufficient documentation

## 2015-07-13 DIAGNOSIS — Z91040 Latex allergy status: Secondary | ICD-10-CM | POA: Diagnosis not present

## 2015-07-13 DIAGNOSIS — Z79899 Other long term (current) drug therapy: Secondary | ICD-10-CM | POA: Insufficient documentation

## 2015-07-13 DIAGNOSIS — R1033 Periumbilical pain: Secondary | ICD-10-CM | POA: Diagnosis present

## 2015-07-13 DIAGNOSIS — Z872 Personal history of diseases of the skin and subcutaneous tissue: Secondary | ICD-10-CM | POA: Insufficient documentation

## 2015-07-13 DIAGNOSIS — Z7952 Long term (current) use of systemic steroids: Secondary | ICD-10-CM | POA: Insufficient documentation

## 2015-07-13 DIAGNOSIS — Z792 Long term (current) use of antibiotics: Secondary | ICD-10-CM | POA: Diagnosis not present

## 2015-07-13 LAB — URINALYSIS, ROUTINE W REFLEX MICROSCOPIC
BILIRUBIN URINE: NEGATIVE
GLUCOSE, UA: NEGATIVE mg/dL
Ketones, ur: NEGATIVE mg/dL
Nitrite: NEGATIVE
Protein, ur: NEGATIVE mg/dL
SPECIFIC GRAVITY, URINE: 1.025 (ref 1.005–1.030)
Urobilinogen, UA: 1 mg/dL (ref 0.0–1.0)
pH: 6.5 (ref 5.0–8.0)

## 2015-07-13 LAB — URINE MICROSCOPIC-ADD ON

## 2015-07-13 LAB — PREGNANCY, URINE: Preg Test, Ur: NEGATIVE

## 2015-07-13 MED ORDER — IBUPROFEN 400 MG PO TABS: 400.0000 mg | ORAL_TABLET | Freq: Four times a day (QID) | ORAL | Status: DC | PRN

## 2015-07-13 NOTE — Discharge Instructions (Signed)
Hernia, Adult A hernia is the bulging of an organ or tissue through a weak spot in the muscles of the abdomen (abdominal wall). Hernias develop most often near the navel or groin. There are many kinds of hernias. Common kinds include:  Femoral hernia. This kind of hernia develops under the groin in the upper thigh area.  Inguinal hernia. This kind of hernia develops in the groin or scrotum.  Umbilical hernia. This kind of hernia develops near the navel.  Hiatal hernia. This kind of hernia causes part of the stomach to be pushed up into the chest.  Incisional hernia. This kind of hernia bulges through a scar from an abdominal surgery. CAUSES This condition may be caused by:  Heavy lifting.  Coughing over a long period of time.  Straining to have a bowel movement.  An incision made during an abdominal surgery.  A birth defect (congenital defect).  Excess weight or obesity.  Smoking.  Poor nutrition.  Cystic fibrosis.  Excess fluid in the abdomen.  Undescended testicles. SYMPTOMS Symptoms of a hernia include:  A lump on the abdomen. This is the first sign of a hernia. The lump may become more obvious with standing, straining, or coughing. It may get bigger over time if it is not treated or if the condition causing it is not treated.  Pain. A hernia is usually painless, but it may become painful over time if treatment is delayed. The pain is usually dull and may get worse with standing or lifting heavy objects. Sometimes a hernia gets tightly squeezed in the weak spot (strangulated) or stuck there (incarcerated) and causes additional symptoms. These symptoms may include:  Vomiting.  Nausea.  Constipation.  Irritability. DIAGNOSIS A hernia may be diagnosed with:  A physical exam. During the exam your health care provider may ask you to cough or to make a specific movement, because a hernia is usually more visible when you move.  Imaging tests. These can  include:  X-rays.  Ultrasound.  CT scan. TREATMENT A hernia that is small and painless may not need to be treated. A hernia that is large or painful may be treated with surgery. Inguinal hernias may be treated with surgery to prevent incarceration or strangulation. Strangulated hernias are always treated with surgery, because lack of blood to the trapped organ or tissue can cause it to die. Surgery to treat a hernia involves pushing the bulge back into place and repairing the weak part of the abdomen. HOME CARE INSTRUCTIONS  Avoid straining.  Do not lift anything heavier than 10 lb (4.5 kg).  Lift with your leg muscles, not your back muscles. This helps avoid strain.  When coughing, try to cough gently.  Prevent constipation. Constipation leads to straining with bowel movements, which can make a hernia worse or cause a hernia repair to break down. You can prevent constipation by:  Eating a high-fiber diet that includes plenty of fruits and vegetables.  Drinking enough fluids to keep your urine clear or pale yellow. Aim to drink 6-8 glasses of water per day.  Using a stool softener as directed by your health care provider.  Lose weight, if you are overweight.  Do not use any tobacco products, including cigarettes, chewing tobacco, or electronic cigarettes. If you need help quitting, ask your health care provider.  Keep all follow-up visits as directed by your health care provider. This is important. Your health care provider may need to monitor your condition. SEEK MEDICAL CARE IF:  You have   swelling, redness, and pain in the affected area.  Your bowel habits change. SEEK IMMEDIATE MEDICAL CARE IF:  You have a fever.  You have abdominal pain that is getting worse.  You feel nauseous or you vomit.  You cannot push the hernia back in place by gently pressing on it while you are lying down.  The hernia:  Changes in shape or size.  Is stuck outside the  abdomen.  Becomes discolored.  Feels hard or tender.   This information is not intended to replace advice given to you by your health care provider. Make sure you discuss any questions you have with your health care provider.   Document Released: 08/25/2005 Document Revised: 09/15/2014 Document Reviewed: 07/05/2014 Elsevier Interactive Patient Education 2016 [employer]  

## 2015-07-13 NOTE — ED Notes (Signed)
Sharp abdominal pain for a week on and off. States she has a knot above her umbilicus. She feels she has a hernia.

## 2015-07-13 NOTE — ED Provider Notes (Signed)
CSN: [id number]     Arrival date & time 07/13/15  1300 History   First MD Initiated Contact with Patient 07/13/15 1508     Chief Complaint  Patient presents with  . Abdominal Pain   HPI Pt started having sharp pain right above the umbilicus about one week ago.  The pain has been off and on.   It feels like a pulling, tugging.  She has noticed a knot in the area.  The knot seems to get bigger and smaller depending on what she was doing.  Right now it is not enlarged but earlier her children even noticed it. It is tender to touch.  No fever, or vomiting.  No trouble with appetite.  No dysuria.     Past Medical History  Diagnosis Date  . Back pain   . Hypertension   . GERD (gastroesophageal reflux disease)   . Eczema    Past Surgical History  Procedure Laterality Date  . Tubal ligation    . Cesarean section     No family history on file. Social History  Substance Use Topics  . Smoking status: Never Smoker   . Smokeless tobacco: None  . Alcohol Use: No   OB History    No data available     Review of Systems  All other systems reviewed and are negative.     Allergies  Latex  Home Medications   Prior to Admission medications   Medication Sig Start Date End Date Taking? Authorizing Provider  amoxicillin-clavulanate (AUGMENTIN) 875-125 MG tablet Take 1 tablet by mouth 2 (two) times daily. 06/14/15   Patrecia Pour, MD  hydrochlorothiazide (HYDRODIURIL) 25 MG tablet Take 1 tablet (25 mg total) by mouth daily. 01/02/15   Leeanne Rio, MD  ibuprofen (ADVIL,MOTRIN) 400 MG tablet Take 1 tablet (400 mg total) by mouth every 6 (six) hours as needed. 07/13/15   Dorie Rank, MD  ipratropium (ATROVENT) 0.06 % nasal spray Place 2 sprays into both nostrils 4 (four) times daily. 02/13/15   Hilton Sinclair, MD  lisinopril (PRINIVIL,ZESTRIL) 40 MG tablet Take 1 tablet (40 mg total) by mouth daily. 01/02/15   Leeanne Rio, MD  sertraline (ZOLOFT) 50 MG tablet Take 1 tablet (50 mg  total) by mouth daily. 01/02/15   Leeanne Rio, MD  triamcinolone ointment (KENALOG) 0.5 % Apply topically 2 (two) times daily. 01/13/14   Blanchie Dessert, MD   BP 178/111 mmHg  Pulse 98  Temp(Src) 98.2 F (36.8 C) (Oral)  Resp 20  Ht 5' 7.5" (1.715 m)  Wt 258 lb (117.028 kg)  BMI 39.79 kg/m2  SpO2 100% Physical Exam  Constitutional: She appears well-developed and well-nourished. No distress.  HENT:  Head: Normocephalic and atraumatic.  Right Ear: External ear normal.  Left Ear: External ear normal.  Eyes: Conjunctivae are normal. Right eye exhibits no discharge. Left eye exhibits no discharge. No scleral icterus.  Neck: Neck supple. No tracheal deviation present.  Cardiovascular: Normal rate, regular rhythm and intact distal pulses.   Pulmonary/Chest: Effort normal and breath sounds normal. No stridor. No respiratory distress. She has no wheezes. She has no rales.  Abdominal: Soft. Bowel sounds are normal. She exhibits no distension. There is tenderness. There is no rebound and no guarding.  Mild ttp above the umbilicus, no erythema, no induration  Musculoskeletal: She exhibits no edema or tenderness.  Neurological: She is alert. She has normal strength. No cranial nerve deficit (no facial droop, extraocular movements intact, no  slurred speech) or sensory deficit. She exhibits normal muscle tone. She displays no seizure activity. Coordination normal.  Skin: Skin is warm and dry. No rash noted.  Psychiatric: She has a normal mood and affect.  Nursing note and vitals reviewed.   ED Course  Procedures (including critical care time) Labs Review Labs Reviewed  URINALYSIS, ROUTINE W REFLEX MICROSCOPIC (NOT AT [facility]) - Abnormal; Notable for the following:    APPearance CLOUDY (*)    Hgb urine dipstick TRACE (*)    Leukocytes, UA TRACE (*)    All other components within normal limits  URINE MICROSCOPIC-ADD ON - Abnormal; Notable for the following:    Squamous Epithelial / LPF  FEW (*)    Bacteria, UA FEW (*)    All other components within normal limits  PREGNANCY, URINE      MDM   Final diagnoses:  Umbilical hernia without obstruction and without gangrene  Essential hypertension    No definite hernia noted on my exam but suspect she has a small paraumbilical hernia by her history.  Discussed treatment.  Warning signs and precautions.  Routine follow up with general surgery.  BP is elevated today.  Asymptomatic.  Follow up with PCP   Dorie Rank, MD 07/13/15 [phone]

## 2015-07-24 ENCOUNTER — Ambulatory Visit: Payer: Self-pay | Admitting: Surgery

## 2015-07-24 NOTE — H&P (Signed)
History of Present Illness Summer Brewer. Summer Leckey MD; 07/24/2015 5:48 PM) The patient is a 44 year old female who presents with an umbilical hernia. Referred by Dr. Chrisandra Netters for evaluation of umbilical hernia  This is a 44 year old female who presents with a two-month history of pain above her umbilicus. She was evaluated in the emergency department because of this sharp pain. She has developed a bulge in this area this seems to fluctuate in size. His area is fairly tender to palpation. There is no sign of infection this area. She denies any GI obstructive symptoms. She was felt to have an umbilical hernia is now referred for surgical evaluation.   Other Problems Summer Brewer, CMA; 07/24/2015 2:12 PM) High blood pressure  Past Surgical History Summer Brewer, [place]; 07/24/2015 2:12 PM) Cesarean Section - Multiple  Diagnostic Studies History Summer Brewer, CMA; 07/24/2015 2:12 PM) Colonoscopy never Mammogram within last year Pap Smear 1-5 years ago  Allergies Summer Brewer, CMA; 07/24/2015 2:12 PM) Latex Hives, Itching.  Medication History Summer Brewer, CMA; 07/24/2015 2:13 PM) Ibuprofen (400MG  Tablet, Oral) Active. HydroCHLOROthiazide (25MG  Tablet, Oral) Active. Atrovent (0.06% Solution, Nasal) Active. Lisinopril (40MG  Tablet, Oral) Active. Zoloft (50MG  Tablet, Oral) Active. Kenalog (0.5% Cream, External) Active. Medications Reconciled  Social History Summer Brewer, [place]; 07/24/2015 2:12 PM) Caffeine use Carbonated beverages, Tea. No alcohol use No drug use Tobacco use Never smoker.  Family History Summer Brewer, [place]; 07/24/2015 2:12 PM) Bleeding disorder Daughter, Mother. Colon Polyps Father. Hypertension Daughter, Father, Mother. Migraine Headache Daughter. Prostate Cancer Father. Seizure disorder Daughter. Thyroid problems Mother.  Pregnancy / Birth History Summer Brewer, CMA; 07/24/2015 2:12 PM) Age at menarche 34 years. Contraceptive  History Intrauterine device. Gravida 3 Irregular periods Maternal age 62-25 Para 2     Review of Systems Summer Brewer CMA; 07/24/2015 2:12 PM) General Present- Weight Gain. Not Present- Appetite Loss, Chills, Fatigue, Fever, Night Sweats and Weight Loss. Skin Not Present- Change in Wart/Mole, Dryness, Hives, Jaundice, New Lesions, Non-Healing Wounds, Rash and Ulcer. HEENT Present- Earache, Ringing in the Ears and Sinus Pain. Not Present- Hearing Loss, Hoarseness, Nose Bleed, Oral Ulcers, Seasonal Allergies, Sore Throat, Visual Disturbances, Wears glasses/contact lenses and Yellow Eyes. Respiratory Not Present- Bloody sputum, Chronic Cough, Difficulty Breathing, Snoring and Wheezing. Breast Not Present- Breast Mass, Breast Pain, Nipple Discharge and Skin Changes. Cardiovascular Not Present- Chest Pain, Difficulty Breathing Lying Down, Leg Cramps, Palpitations, Rapid Heart Rate, Shortness of Breath and Swelling of Extremities. Gastrointestinal Not Present- Abdominal Pain, Bloating, Bloody Stool, Change in Bowel Habits, Chronic diarrhea, Constipation, Difficulty Swallowing, Excessive gas, Gets full quickly at meals, Hemorrhoids, Indigestion, Nausea, Rectal Pain and Vomiting. Female Genitourinary Not Present- Frequency, Nocturia, Painful Urination, Pelvic Pain and Urgency. Musculoskeletal Not Present- Back Pain, Joint Pain, Joint Stiffness, Muscle Pain, Muscle Weakness and Swelling of Extremities. Neurological Not Present- Decreased Memory, Fainting, Headaches, Numbness, Seizures, Tingling, Tremor, Trouble walking and Weakness. Psychiatric Not Present- Anxiety, Bipolar, Change in Sleep Pattern, Depression, Fearful and Frequent crying. Endocrine Not Present- Cold Intolerance, Excessive Hunger, Hair Changes, Heat Intolerance, Hot flashes and New Diabetes. Hematology Not Present- Easy Bruising, Excessive bleeding, Gland problems, HIV and Persistent Infections.  Vitals Summer Brewer CMA;  07/24/2015 2:14 PM) 07/24/2015 2:13 PM Weight: 255.6 lb Height: 67.5in Body Surface Area: 2.26 m Body Mass Index: 39.44 kg/m  Temp.: 26F(Temporal)  Pulse: 94 (Regular)  BP: 130/70 (Sitting, Left Arm, Standard)      Physical Exam Rodman Key K. Tamberlyn Midgley MD; 07/24/2015 5:48 PM)  The physical exam findings are  as follows: Note:WDWN in NAD HEENT: EOMI, sclera anicteric Neck: No masses, no thyromegaly Lungs: CTA bilaterally; normal respiratory effort CV: Regular rate and rhythm; no murmurs Abd: +bowel sounds, soft, non-tender, protruding umbilical hernia at top edge of umbilicus with possible smaller second hernia in the base of the umbilicus; reducible Ext: Well-perfused; no edema Skin: Warm, dry; no sign of jaundice    Assessment & Plan Summer Brewer. Amro Winebarger MD; [id number] PM)  UMBILICAL HERNIA WITHOUT OBSTRUCTION OR GANGRENE (K42.9)  Current Plans Schedule for Surgery - Umbilical hernia repair with mesh. The surgical procedure has been discussed with the patient. Potential risks, benefits, alternative treatments, and expected outcomes have been explained. All of the patient's questions at this time have been answered. The likelihood of reaching the patient's treatment goal is good. The patient understand the proposed surgical procedure and wishes to proceed.   Summer Brewer. Summer Dover, MD, [facility]  General/ Trauma Surgery  07/24/2015 5:49 PM

## 2015-08-06 ENCOUNTER — Ambulatory Visit (INDEPENDENT_AMBULATORY_CARE_PROVIDER_SITE_OTHER): Payer: BLUE CROSS/BLUE SHIELD | Admitting: *Deleted

## 2015-08-06 DIAGNOSIS — Z23 Encounter for immunization: Secondary | ICD-10-CM

## 2015-08-08 ENCOUNTER — Encounter (HOSPITAL_COMMUNITY): Payer: Self-pay | Admitting: *Deleted

## 2015-08-08 MED ORDER — CEFAZOLIN SODIUM-DEXTROSE 2-3 GM-% IV SOLR
2.0000 g | INTRAVENOUS | Status: AC
  Administered 2015-08-09: 2 g via INTRAVENOUS
  Filled 2015-08-08: qty 50

## 2015-08-08 MED ORDER — CHLORHEXIDINE GLUCONATE 4 % EX LIQD: 1.0000 "application " | Freq: Once | CUTANEOUS | Status: DC

## 2015-08-08 NOTE — Progress Notes (Signed)
Pt denies SOB, chest pain, and being under the care of a cardiologist. Pt denies having a stress test, echo and cardiac cath. Pt denies having a chest xray and EKG within the last year. Pt made aware to stop taking Aspirin, vitamins, fish oil and herbal medications. Do not take any NSAIDs ie: Ibuprofen, Advil, Naproxen or any medication containing Aspirin. Pt verbalized understanding of all pre-op instructions.

## 2015-08-09 ENCOUNTER — Encounter (HOSPITAL_COMMUNITY)
Admission: RE | Disposition: A | Discharge status: Home / Self Care | Payer: Self-pay | Source: Ambulatory Visit | Attending: Surgery

## 2015-08-09 ENCOUNTER — Ambulatory Visit (HOSPITAL_COMMUNITY)
Admission: RE | Admit: 2015-08-09 | Discharge: 2015-08-09 | Disposition: A | Discharge status: Home / Self Care | Payer: BLUE CROSS/BLUE SHIELD | Source: Ambulatory Visit | Attending: Surgery | Admitting: Surgery

## 2015-08-09 ENCOUNTER — Encounter (HOSPITAL_COMMUNITY): Payer: Self-pay | Admitting: *Deleted

## 2015-08-09 ENCOUNTER — Ambulatory Visit (HOSPITAL_COMMUNITY): Payer: BLUE CROSS/BLUE SHIELD | Admitting: Anesthesiology

## 2015-08-09 ENCOUNTER — Other Ambulatory Visit: Payer: Self-pay

## 2015-08-09 DIAGNOSIS — K429 Umbilical hernia without obstruction or gangrene: Secondary | ICD-10-CM | POA: Diagnosis present

## 2015-08-09 DIAGNOSIS — I10 Essential (primary) hypertension: Secondary | ICD-10-CM | POA: Insufficient documentation

## 2015-08-09 HISTORY — DX: Umbilical hernia without obstruction or gangrene: K42.9

## 2015-08-09 HISTORY — PX: UMBILICAL HERNIA REPAIR: SHX196

## 2015-08-09 HISTORY — PX: INSERTION OF MESH: SHX5868

## 2015-08-09 LAB — BASIC METABOLIC PANEL
Anion gap: 8 (ref 5–15)
BUN: 9 mg/dL (ref 6–20)
CALCIUM: 9.6 mg/dL (ref 8.9–10.3)
CHLORIDE: 106 mmol/L (ref 101–111)
CO2: 24 mmol/L (ref 22–32)
CREATININE: 0.81 mg/dL (ref 0.44–1.00)
GFR calc non Af Amer: >60 mL/min (ref >60)
Glucose, Bld: 77 mg/dL (ref 65–99)
Potassium: 3.8 mmol/L (ref 3.5–5.1)
SODIUM: 138 mmol/L (ref 135–145)

## 2015-08-09 LAB — CBC
HCT: 42.8 % (ref 36.0–46.0)
Hemoglobin: 13.8 g/dL (ref 12.0–15.0)
MCH: 27.1 pg (ref 26.0–34.0)
MCHC: 32.2 g/dL (ref 30.0–36.0)
MCV: 83.9 fL (ref 78.0–100.0)
PLATELETS: 159 10*3/uL (ref 150–400)
RBC: 5.1 MIL/uL (ref 3.87–5.11)
RDW: 14 % (ref 11.5–15.5)
WBC: 5.4 10*3/uL (ref 4.0–10.5)

## 2015-08-09 LAB — HCG, SERUM, QUALITATIVE: PREG SERUM: NEGATIVE

## 2015-08-09 SURGERY — REPAIR, HERNIA, UMBILICAL, ADULT
Anesthesia: General | Site: Abdomen

## 2015-08-09 MED ORDER — LACTATED RINGERS IV SOLN
INTRAVENOUS | Status: DC
  Administered 2015-08-09 (×3): via INTRAVENOUS

## 2015-08-09 MED ORDER — MORPHINE SULFATE (PF) 2 MG/ML IV SOLN: 2.0000 mg | INTRAVENOUS | Status: DC | PRN

## 2015-08-09 MED ORDER — NEOSTIGMINE METHYLSULFATE 10 MG/10ML IV SOLN
INTRAVENOUS | Status: DC | PRN
  Administered 2015-08-09: 4 mg via INTRAVENOUS

## 2015-08-09 MED ORDER — OXYCODONE HCL 5 MG PO TABS
5.0000 mg | ORAL_TABLET | Freq: Once | ORAL | Status: AC | PRN
  Administered 2015-08-09: 5 mg via ORAL

## 2015-08-09 MED ORDER — ROCURONIUM BROMIDE 100 MG/10ML IV SOLN
INTRAVENOUS | Status: DC | PRN
  Administered 2015-08-09: 30 mg via INTRAVENOUS

## 2015-08-09 MED ORDER — LIDOCAINE HCL (CARDIAC) 20 MG/ML IV SOLN
INTRAVENOUS | Status: DC | PRN
  Administered 2015-08-09: 60 mg via INTRAVENOUS

## 2015-08-09 MED ORDER — NEOSTIGMINE METHYLSULFATE 10 MG/10ML IV SOLN
INTRAVENOUS | Status: AC
  Filled 2015-08-09: qty 2

## 2015-08-09 MED ORDER — FENTANYL CITRATE (PF) 100 MCG/2ML IJ SOLN
INTRAMUSCULAR | Status: AC
  Filled 2015-08-09: qty 2

## 2015-08-09 MED ORDER — BUPIVACAINE-EPINEPHRINE (PF) 0.25% -1:200000 IJ SOLN
INTRAMUSCULAR | Status: AC
  Filled 2015-08-09: qty 30

## 2015-08-09 MED ORDER — ONDANSETRON HCL 4 MG/2ML IJ SOLN: 4.0000 mg | INTRAMUSCULAR | Status: DC | PRN

## 2015-08-09 MED ORDER — ONDANSETRON HCL 4 MG/2ML IJ SOLN: 4.0000 mg | Freq: Once | INTRAMUSCULAR | Status: DC | PRN

## 2015-08-09 MED ORDER — FENTANYL CITRATE (PF) 100 MCG/2ML IJ SOLN
25.0000 ug | INTRAMUSCULAR | Status: DC | PRN
  Administered 2015-08-09: 50 ug via INTRAVENOUS

## 2015-08-09 MED ORDER — PHENYLEPHRINE HCL 10 MG/ML IJ SOLN
INTRAMUSCULAR | Status: AC
  Filled 2015-08-09: qty 1

## 2015-08-09 MED ORDER — BUPIVACAINE-EPINEPHRINE 0.25% -1:200000 IJ SOLN
INTRAMUSCULAR | Status: DC | PRN
  Administered 2015-08-09: 10 mL

## 2015-08-09 MED ORDER — PROPOFOL 10 MG/ML IV BOLUS
INTRAVENOUS | Status: DC | PRN
  Administered 2015-08-09: 200 mg via INTRAVENOUS

## 2015-08-09 MED ORDER — OXYCODONE HCL 5 MG/5ML PO SOLN: 5.0000 mg | Freq: Once | ORAL | Status: AC | PRN

## 2015-08-09 MED ORDER — EPHEDRINE SULFATE 50 MG/ML IJ SOLN
INTRAMUSCULAR | Status: DC | PRN
  Administered 2015-08-09: 10 mg via INTRAVENOUS

## 2015-08-09 MED ORDER — 0.9 % SODIUM CHLORIDE (POUR BTL) OPTIME
TOPICAL | Status: DC | PRN
  Administered 2015-08-09: 1000 mL

## 2015-08-09 MED ORDER — PHENYLEPHRINE HCL 10 MG/ML IJ SOLN
INTRAMUSCULAR | Status: DC | PRN
  Administered 2015-08-09 (×4): 80 ug via INTRAVENOUS

## 2015-08-09 MED ORDER — OXYCODONE-ACETAMINOPHEN 5-325 MG PO TABS
1.0000 | ORAL_TABLET | ORAL | Status: DC | PRN
  Administered 2015-08-09: 1 via ORAL

## 2015-08-09 MED ORDER — ONDANSETRON HCL 4 MG/2ML IJ SOLN
INTRAMUSCULAR | Status: DC | PRN
  Administered 2015-08-09: 4 mg via INTRAVENOUS

## 2015-08-09 MED ORDER — FENTANYL CITRATE (PF) 250 MCG/5ML IJ SOLN
INTRAMUSCULAR | Status: AC
  Filled 2015-08-09: qty 5

## 2015-08-09 MED ORDER — PROPOFOL 10 MG/ML IV BOLUS
INTRAVENOUS | Status: AC
  Filled 2015-08-09: qty 20

## 2015-08-09 MED ORDER — ARTIFICIAL TEARS OP OINT
TOPICAL_OINTMENT | OPHTHALMIC | Status: AC
  Filled 2015-08-09: qty 3.5

## 2015-08-09 MED ORDER — ROCURONIUM BROMIDE 50 MG/5ML IV SOLN
INTRAVENOUS | Status: AC
  Filled 2015-08-09: qty 1

## 2015-08-09 MED ORDER — GLYCOPYRROLATE 0.2 MG/ML IJ SOLN
INTRAMUSCULAR | Status: AC
  Filled 2015-08-09: qty 6

## 2015-08-09 MED ORDER — MIDAZOLAM HCL 2 MG/2ML IJ SOLN
INTRAMUSCULAR | Status: AC
  Filled 2015-08-09: qty 2

## 2015-08-09 MED ORDER — SUCCINYLCHOLINE CHLORIDE 20 MG/ML IJ SOLN
INTRAMUSCULAR | Status: AC
  Filled 2015-08-09: qty 1

## 2015-08-09 MED ORDER — OXYCODONE-ACETAMINOPHEN 5-325 MG PO TABS
ORAL_TABLET | ORAL | Status: AC
  Administered 2015-08-09: 1 via ORAL
  Filled 2015-08-09: qty 1

## 2015-08-09 MED ORDER — OXYCODONE-ACETAMINOPHEN 5-325 MG PO TABS: 1.0000 | ORAL_TABLET | ORAL | Status: DC | PRN

## 2015-08-09 MED ORDER — FENTANYL CITRATE (PF) 100 MCG/2ML IJ SOLN
INTRAMUSCULAR | Status: DC | PRN
  Administered 2015-08-09 (×2): 50 ug via INTRAVENOUS
  Administered 2015-08-09: 100 ug via INTRAVENOUS
  Administered 2015-08-09: 50 ug via INTRAVENOUS

## 2015-08-09 MED ORDER — MIDAZOLAM HCL 5 MG/5ML IJ SOLN
INTRAMUSCULAR | Status: DC | PRN
  Administered 2015-08-09: 2 mg via INTRAVENOUS

## 2015-08-09 MED ORDER — GLYCOPYRROLATE 0.2 MG/ML IJ SOLN
INTRAMUSCULAR | Status: DC | PRN
  Administered 2015-08-09: 0.6 mg via INTRAVENOUS

## 2015-08-09 MED ORDER — OXYCODONE HCL 5 MG PO TABS
ORAL_TABLET | ORAL | Status: AC
  Filled 2015-08-09: qty 1

## 2015-08-09 MED ORDER — ONDANSETRON HCL 4 MG/2ML IJ SOLN
INTRAMUSCULAR | Status: AC
  Filled 2015-08-09: qty 4

## 2015-08-09 SURGICAL SUPPLY — 46 items
APL SKNCLS STERI-STRIP NONHPOA (GAUZE/BANDAGES/DRESSINGS) ×2
BENZOIN TINCTURE PRP APPL 2/3 (GAUZE/BANDAGES/DRESSINGS) ×3 IMPLANT
BLADE SURG 15 STRL LF DISP TIS (BLADE) ×2 IMPLANT
BLADE SURG 15 STRL SS (BLADE) ×3
BLADE SURG ROTATE 9660 (MISCELLANEOUS) IMPLANT
CANISTER SUCTION 2500CC (MISCELLANEOUS) IMPLANT
CHLORAPREP W/TINT 26ML (MISCELLANEOUS) ×3 IMPLANT
COVER SURGICAL LIGHT HANDLE (MISCELLANEOUS) ×3 IMPLANT
DRAPE PED LAPAROTOMY (DRAPES) ×3 IMPLANT
DRAPE UTILITY XL STRL (DRAPES) ×3 IMPLANT
DRSG TEGADERM 4X4.75 (GAUZE/BANDAGES/DRESSINGS) ×3 IMPLANT
ELECT CAUTERY BLADE 6.4 (BLADE) ×3 IMPLANT
ELECT REM PT RETURN 9FT ADLT (ELECTROSURGICAL) ×3
ELECTRODE REM PT RTRN 9FT ADLT (ELECTROSURGICAL) ×2 IMPLANT
GAUZE SPONGE 2X2 8PLY STRL LF (GAUZE/BANDAGES/DRESSINGS) ×2 IMPLANT
GAUZE SPONGE 4X4 16PLY XRAY LF (GAUZE/BANDAGES/DRESSINGS) ×3 IMPLANT
GLOVE BIO SURGEON STRL SZ7 (GLOVE) ×2 IMPLANT
GLOVE BIOGEL PI IND STRL 7.0 (GLOVE) IMPLANT
GLOVE BIOGEL PI IND STRL 7.5 (GLOVE) ×2 IMPLANT
GLOVE BIOGEL PI INDICATOR 7.0 (GLOVE) ×1
GLOVE BIOGEL PI INDICATOR 7.5 (GLOVE) ×1
GLOVE SURG SS PI 7.0 STRL IVOR (GLOVE) ×2 IMPLANT
GOWN STRL REUS W/ TWL LRG LVL3 (GOWN DISPOSABLE) ×4 IMPLANT
GOWN STRL REUS W/TWL LRG LVL3 (GOWN DISPOSABLE) ×6
KIT BASIN OR (CUSTOM PROCEDURE TRAY) ×3 IMPLANT
KIT ROOM TURNOVER OR (KITS) ×3 IMPLANT
MESH VENTRALEX ST 2.5 CRC MED (Mesh General) ×1 IMPLANT
NDL HYPO 25GX1X1/2 BEV (NEEDLE) ×2 IMPLANT
NEEDLE HYPO 25GX1X1/2 BEV (NEEDLE) ×3 IMPLANT
NS IRRIG 1000ML POUR BTL (IV SOLUTION) ×3 IMPLANT
PACK SURGICAL SETUP 50X90 (CUSTOM PROCEDURE TRAY) ×3 IMPLANT
PAD ARMBOARD 7.5X6 YLW CONV (MISCELLANEOUS) ×3 IMPLANT
PENCIL BUTTON HOLSTER BLD 10FT (ELECTRODE) ×3 IMPLANT
SPONGE GAUZE 2X2 STER 10/PKG (GAUZE/BANDAGES/DRESSINGS) ×1
STRIP CLOSURE SKIN 1/2X4 (GAUZE/BANDAGES/DRESSINGS) ×3 IMPLANT
SUT MNCRL AB 4-0 PS2 18 (SUTURE) ×3 IMPLANT
SUT NOVA NAB DX-16 0-1 5-0 T12 (SUTURE) ×3 IMPLANT
SUT NOVA NAB GS-21 0 18 T12 DT (SUTURE) ×4 IMPLANT
SUT VIC AB 3-0 SH 27 (SUTURE) ×3
SUT VIC AB 3-0 SH 27X BRD (SUTURE) ×2 IMPLANT
SYR BULB 3OZ (MISCELLANEOUS) ×3 IMPLANT
SYR CONTROL 10ML LL (SYRINGE) ×3 IMPLANT
TOWEL OR 17X24 6PK STRL BLUE (TOWEL DISPOSABLE) ×3 IMPLANT
TOWEL OR 17X26 10 PK STRL BLUE (TOWEL DISPOSABLE) ×2 IMPLANT
TUBE CONNECTING 12X1/4 (SUCTIONS) IMPLANT
YANKAUER SUCT BULB TIP NO VENT (SUCTIONS) IMPLANT

## 2015-08-09 NOTE — Interval H&P Note (Signed)
History and Physical Interval Note:  08/09/2015 7:40 AM  Summer Brewer  has presented today for surgery, with the diagnosis of Umbilical hernia  The various methods of treatment have been discussed with the patient and family. After consideration of risks, benefits and other options for treatment, the patient has consented to  Procedure(s): UMBILICAL HERNIA REPAIR (N/A) INSERTION OF MESH (N/A) as a surgical intervention .  The patient's history has been reviewed, patient examined, no change in status, stable for surgery.  I have reviewed the patient's chart and labs.  Questions were answered to the patient's satisfaction.     Finch Costanzo K.

## 2015-08-09 NOTE — Transfer of Care (Signed)
Immediate Anesthesia Transfer of Care Note  Patient: Summer Brewer  Procedure(s) Performed: Procedure(s): UMBILICAL HERNIA REPAIR INSERTION OF MESH  Patient Location: PACU  Anesthesia Type:General  Level of Consciousness: oriented, drowsy  Airway & Oxygen Therapy: Patient Spontanous Breathing  Post-op Assessment: Report given to RN and Post -op Vital signs reviewed and stable  Post vital signs: Reviewed and stable  Last Vitals:  Filed Vitals:   08/09/15 1130  BP: 136/84  Pulse: 98  Temp: 36.4 C  Resp: 18    Complications: No apparent anesthesia complications

## 2015-08-09 NOTE — H&P (View-Only) (Signed)
History of Present Illness Summer Brewer. Summer Spindel MD; 07/24/2015 5:48 PM) The patient is a 44 year old female who presents with an umbilical hernia. Referred by Dr. Chrisandra Netters for evaluation of umbilical hernia  This is a 44 year old female who presents with a two-month history of pain above her umbilicus. She was evaluated in the emergency department because of this sharp pain. She has developed a bulge in this area this seems to fluctuate in size. His area is fairly tender to palpation. There is no sign of infection this area. She denies any GI obstructive symptoms. She was felt to have an umbilical hernia is now referred for surgical evaluation.   Other Problems [name], CMA; 07/24/2015 2:12 PM) High blood pressure  Past Surgical History [name], [place]; 07/24/2015 2:12 PM) Cesarean Section - Multiple  Diagnostic Studies History [name], CMA; 07/24/2015 2:12 PM) Colonoscopy never Mammogram within last year Pap Smear 1-5 years ago  Allergies [name], CMA; 07/24/2015 2:12 PM) Latex Hives, Itching.  Medication History [name], CMA; 07/24/2015 2:13 PM) Ibuprofen (400MG  Tablet, Oral) Active. HydroCHLOROthiazide (25MG  Tablet, Oral) Active. Atrovent (0.06% Solution, Nasal) Active. Lisinopril (40MG  Tablet, Oral) Active. Zoloft (50MG  Tablet, Oral) Active. Kenalog (0.5% Cream, External) Active. Medications Reconciled  Social History [name], [place]; 07/24/2015 2:12 PM) Caffeine use Carbonated beverages, Tea. No alcohol use No drug use Tobacco use Never smoker.  Family History [name], [place]; 07/24/2015 2:12 PM) Bleeding disorder Daughter, Mother. Colon Polyps Father. Hypertension Daughter, Father, Mother. Migraine Headache Daughter. Prostate Cancer Father. Seizure disorder Daughter. Thyroid problems Mother.  Pregnancy / Birth History [name], CMA; 07/24/2015 2:12 PM) Age at menarche 27 years. Contraceptive  History Intrauterine device. Gravida 3 Irregular periods Maternal age 16-25 Para 2     Review of Systems [name] CMA; 07/24/2015 2:12 PM) General Present- Weight Gain. Not Present- Appetite Loss, Chills, Fatigue, Fever, Night Sweats and Weight Loss. Skin Not Present- Change in Wart/Mole, Dryness, Hives, Jaundice, New Lesions, Non-Healing Wounds, Rash and Ulcer. HEENT Present- Earache, Ringing in the Ears and Sinus Pain. Not Present- Hearing Loss, Hoarseness, Nose Bleed, Oral Ulcers, Seasonal Allergies, Sore Throat, Visual Disturbances, Wears glasses/contact lenses and Yellow Eyes. Respiratory Not Present- Bloody sputum, Chronic Cough, Difficulty Breathing, Snoring and Wheezing. Breast Not Present- Breast Mass, Breast Pain, Nipple Discharge and Skin Changes. Cardiovascular Not Present- Chest Pain, Difficulty Breathing Lying Down, Leg Cramps, Palpitations, Rapid Heart Rate, Shortness of Breath and Swelling of Extremities. Gastrointestinal Not Present- Abdominal Pain, Bloating, Bloody Stool, Change in Bowel Habits, Chronic diarrhea, Constipation, Difficulty Swallowing, Excessive gas, Gets full quickly at meals, Hemorrhoids, Indigestion, Nausea, Rectal Pain and Vomiting. Female Genitourinary Not Present- Frequency, Nocturia, Painful Urination, Pelvic Pain and Urgency. Musculoskeletal Not Present- Back Pain, Joint Pain, Joint Stiffness, Muscle Pain, Muscle Weakness and Swelling of Extremities. Neurological Not Present- Decreased Memory, Fainting, Headaches, Numbness, Seizures, Tingling, Tremor, Trouble walking and Weakness. Psychiatric Not Present- Anxiety, Bipolar, Change in Sleep Pattern, Depression, Fearful and Frequent crying. Endocrine Not Present- Cold Intolerance, Excessive Hunger, Hair Changes, Heat Intolerance, Hot flashes and New Diabetes. Hematology Not Present- Easy Bruising, Excessive bleeding, Gland problems, HIV and Persistent Infections.  Vitals [name] CMA;  07/24/2015 2:14 PM) 07/24/2015 2:13 PM Weight: 255.6 lb Height: 67.5in Body Surface Area: 2.26 m Body Mass Index: 39.44 kg/m  Temp.: 3F(Temporal)  Pulse: 94 (Regular)  BP: 130/70 (Sitting, Left Arm, Standard)      Physical Exam Rodman Key K. Kirby Argueta MD; 07/24/2015 5:48 PM)  The physical exam findings are  as follows: Note:WDWN in NAD HEENT: EOMI, sclera anicteric Neck: No masses, no thyromegaly Lungs: CTA bilaterally; normal respiratory effort CV: Regular rate and rhythm; no murmurs Abd: +bowel sounds, soft, non-tender, protruding umbilical hernia at top edge of umbilicus with possible smaller second hernia in the base of the umbilicus; reducible Ext: Well-perfused; no edema Skin: Warm, dry; no sign of jaundice    Assessment & Plan Summer Brewer. Summer [place] MD; [id number] PM)  UMBILICAL HERNIA WITHOUT OBSTRUCTION OR GANGRENE (K42.9)  Current Plans Schedule for Surgery - Umbilical hernia repair with mesh. The surgical procedure has been discussed with the patient. Potential risks, benefits, alternative treatments, and expected outcomes have been explained. All of the patient's questions at this time have been answered. The likelihood of reaching the patient's treatment goal is good. The patient understand the proposed surgical procedure and wishes to proceed.   Summer Brewer. Georgette Dover, MD, [facility]  General/ Trauma Surgery  07/24/2015 5:49 PM

## 2015-08-09 NOTE — Discharge Instructions (Signed)
[facility] ° °UMBILICAL HERNIA REPAIR: POST OP INSTRUCTIONS ° °Always review your discharge instruction sheet given to you by the facility where your surgery was performed. °IF YOU HAVE DISABILITY OR FAMILY LEAVE FORMS, YOU MUST BRING THEM TO THE OFFICE FOR PROCESSING.   °DO NOT GIVE THEM TO YOUR DOCTOR. ° °1. A  prescription for pain medication may be given to you upon discharge.  Take your pain medication as prescribed, if needed.  If narcotic pain medicine is not needed, then you may take acetaminophen (Tylenol) or ibuprofen (Advil) as needed. °2. Take your usually prescribed medications unless otherwise directed. °3. If you need a refill on your pain medication, please contact your pharmacy.  They will contact our office to request authorization. Prescriptions will not be filled after 5 pm or on week-ends. °4. You should follow a light diet the first 24 hours after arrival home, such as soup and crackers, etc.  Be sure to include lots of fluids daily.  Resume your normal diet the day after surgery. °5. Most patients will experience some swelling and bruising around the umbilicus or in the groin and scrotum.  Ice packs and reclining will help.  Swelling and bruising can take several days to resolve.  °6. It is common to experience some constipation if taking pain medication after surgery.  Increasing fluid intake and taking a stool softener (such as Colace) will usually help or prevent this problem from occurring.  A mild laxative (Milk of Magnesia or Miralax) should be taken according to package directions if there are no bowel movements after 48 hours. °7. Unless discharge instructions indicate otherwise, you may remove your bandages 24-48 hours after surgery, and you may shower at that time.  You will have steri-strips (small skin tapes) in place directly over the incision.  These strips should be left on the skin for 7-10 days. °8. ACTIVITIES:  You may resume regular (light) daily activities  beginning the next day--such as daily self-care, walking, climbing stairs--gradually increasing activities as tolerated.  You may have sexual intercourse when it is comfortable.  Refrain from any heavy lifting or straining until approved by your doctor. °a. You may drive when you are no longer taking prescription pain medication, you can comfortably wear a seatbelt, and you can safely maneuver your car and apply brakes. °b. RETURN TO WORK:  2-3 weeks with light duty - no lifting over 15 lbs. °9. You should see your doctor in the office for a follow-up appointment approximately 2-3 weeks after your surgery.  Make sure that you call for this appointment within a day or two after you arrive home to insure a convenient appointment time. °10. OTHER INSTRUCTIONS:  __________________________________________________________________________________________________________________________________________________________________________________________  °WHEN TO CALL YOUR DOCTOR: °1. Fever over 101.0 °2. Inability to urinate °3. Nausea and/or vomiting °4. Extreme swelling or bruising °5. Continued bleeding from incision. °6. Increased pain, redness, or drainage from the incision ° °The clinic staff is available to answer your questions during regular business hours.  Please don’t hesitate to call and ask to speak to one of the nurses for clinical concerns.  If you have a medical emergency, go to the nearest emergency room or call 911.  A surgeon from [facility] is always on call at the hospital ° ° °[address], [address], [place], [place]  [address] ? ° [address] [address], [place], [place]   [address] °[phone]    [phone]    FAX [phone] °Web site: [email or web address] ° ° °

## 2015-08-09 NOTE — Op Note (Signed)
Indications:  The patient presented with a history of an enlarging supra-umbilical hernia.  The patient was examined and we recommended umbilical hernia repair with mesh.  She may have another smaller hernia directly behind her umbilicus.  Pre-operative diagnosis:  Supra-umbilical hernia  Post-operative diagnosis:  Supra-umbilical hernia/ umbilical hernia  Surgeon: Roxy Mastandrea K.   Assistants: none  Anesthesia: General endotracheal anesthesia  ASA Class: 2   Procedure Details  The patient was seen again in the Holding Room. The risks, benefits, complications, treatment options, and expected outcomes were discussed with the patient. The possibilities of reaction to medication, pulmonary aspiration, perforation of viscus, bleeding, recurrent infection, the need for additional procedures, and development of a complication requiring transfusion or further operation were discussed with the patient and/or family. There was concurrence with the proposed plan, and informed consent was obtained. The site of surgery was properly noted/marked. The patient was taken to the Operating Room, identified as KAI KENNON, and the procedure verified as umbilical hernia repair. A Time Out was held and the above information confirmed.  After an adequate level of general anesthesia was obtained, the patient's abdomen was prepped with Chloraprep and draped in sterile fashion.  We made a vertical incision below the umbilicus.  Dissection was carried down to the hernia sac with cautery.  We dissected bluntly around the hernia sac down to the edge of the fascial defect, which was about 2 cm above the umbilicus.  We reduced the hernia sac back into the peritoneal space.  The fascial defect measured 1.5 cm.  We cleared the fascia in all directions.  Another smaller defect was noted in the fascia directly behind the umbilicus.  We extended our incision down around the umbilicus and connected both fascial defects to form  a single 4 cm defect.  A medium 6.4 cm mesh was inserted into the pre-peritoneal space and was deployed.  The mesh was secured with eight trans-fascial sutures of 0 Novofil.  The fascial defect was closed with multiple interrupted figure-of-eight 1 Novofil sutures.  The base of the umbilicus was tacked down with 3-0 Vicryl.  3-0 Vicryl was used to close the subcutaneous tissues and 4-0 Monocryl was used to close the skin.  Steri-strips and clean dressing were applied.  The patient was extubated and brought to the recovery room in stable condition.  All sponge, instrument, and needle counts were correct prior to closure and at the conclusion of the case.   Estimated Blood Loss: Minimal          Complications: None; patient tolerated the procedure well.         Disposition: PACU - hemodynamically stable.         Condition: stable  Imogene Burn. Georgette Dover, MD, [facility]  General/ Trauma Surgery  08/09/2015 2:50 PM

## 2015-08-09 NOTE — Anesthesia Procedure Notes (Signed)
Procedure Name: Intubation Date/Time: 08/09/2015 1:48 PM Performed by: ADAMI, RICHARD Pre-anesthesia Checklist: Patient identified, Timeout performed, Emergency Drugs available, Suction available and Patient being monitored Patient Re-evaluated:Patient Re-evaluated prior to inductionOxygen Delivery Method: Circle system utilized Preoxygenation: Pre-oxygenation with 100% oxygen Intubation Type: IV induction Ventilation: Mask ventilation without difficulty and Oral airway inserted - appropriate to patient size Grade View: Grade I Tube type: Oral Tube size: 7.0 mm Number of attempts: 1 Airway Equipment and Method: Stylet,  Oral airway and LTA kit utilized Placement Confirmation: ETT inserted through vocal cords under direct vision,  breath sounds checked- equal and bilateral and positive ETCO2 Secured at: 22 cm Tube secured with: Tape Dental Injury: Teeth and Oropharynx as per pre-operative assessment      

## 2015-08-09 NOTE — Anesthesia Postprocedure Evaluation (Signed)
Anesthesia Post Note  Patient: Summer Brewer  Procedure(s) Performed: Procedure(s): UMBILICAL HERNIA REPAIR INSERTION OF MESH  Patient location during evaluation: PACU Anesthesia Type: General Level of consciousness: awake and alert Pain management: pain level controlled Vital Signs Assessment: post-procedure vital signs reviewed and stable Respiratory status: spontaneous breathing, nonlabored ventilation, respiratory function stable and patient connected to nasal cannula oxygen Cardiovascular status: blood pressure returned to baseline and stable Postop Assessment: no signs of nausea or vomiting Anesthetic complications: no    Last Vitals:  Filed Vitals:   08/09/15 1524 08/09/15 1539  BP: 123/69 124/74  Pulse: 93 95  Temp:    Resp: 15 14    Last Pain:  Filed Vitals:   08/09/15 1604  PainSc: 4                  Zenaida Deed

## 2015-08-09 NOTE — Anesthesia Preprocedure Evaluation (Signed)
Anesthesia Evaluation  Patient identified by MRN, date of birth, ID band Patient awake    Reviewed: Allergy & Precautions, NPO status , Patient's Chart, lab work & pertinent test results  Airway Mallampati: II  TM Distance: >3 FB Neck ROM: Full    Dental  (+) Teeth Intact, Dental Advisory Given   Pulmonary    breath sounds clear to auscultation       Cardiovascular hypertension,  Rhythm:Regular Rate:Normal     Neuro/Psych    GI/Hepatic   Endo/Other    Renal/GU      Musculoskeletal   Abdominal   Peds  Hematology   Anesthesia Other Findings   Reproductive/Obstetrics                             Anesthesia Physical Anesthesia Plan  ASA: II  Anesthesia Plan: General   Post-op Pain Management:    Induction: Intravenous  Airway Management Planned: Oral ETT  Additional Equipment:   Intra-op Plan:   Post-operative Plan: Extubation in OR  Informed Consent: I have reviewed the patients History and Physical, chart, labs and discussed the procedure including the risks, benefits and alternatives for the proposed anesthesia with the patient or authorized representative who has indicated his/her understanding and acceptance.   Dental advisory given  Plan Discussed with: CRNA and Anesthesiologist  Anesthesia Plan Comments: (Umbilical hernia hypertension)        Anesthesia Quick Evaluation

## 2015-08-10 ENCOUNTER — Encounter (HOSPITAL_COMMUNITY): Payer: Self-pay | Admitting: Surgery

## 2015-08-14 ENCOUNTER — Telehealth: Payer: Self-pay | Admitting: Family Medicine

## 2015-08-14 MED ORDER — HYDROXYZINE HCL 25 MG PO TABS: 25.0000 mg | ORAL_TABLET | Freq: Three times a day (TID) | ORAL | Status: DC | PRN

## 2015-08-14 NOTE — Telephone Encounter (Signed)
Patient should contact [facility], or come to clinic to be evaluated. No pain prescription will be sent over the phone. She can try taking over the counter ibuprofen and tylenol. -Dr. Lamar Benes

## 2015-08-14 NOTE — Telephone Encounter (Signed)
Pt had surgery and this was done by [facility]. They gave her some medication and she is itching everywhere. They told her to stop the medication. The patient wants [place] to prescribe something else. I tried to get her to make an appointment to be seen but she said that it is raining and doesn't want to come out. Please call patient to discuss. jw

## 2015-08-14 NOTE — Telephone Encounter (Signed)
Spoke with patient and she states that she doesn't want any pain medication.  She would just like something to help with the itching.  She has had hydroxyzine in the past and another pill that was green and both of these have helped her.  Will send back to MD. Summer Brewer

## 2015-08-14 NOTE — Telephone Encounter (Signed)
Will forward to MD.  Patient was given oxycodone on 08/09/15.  Patient will probably need to call [facility]. Jazmin Hartsell,CMA

## 2015-09-05 ENCOUNTER — Ambulatory Visit (INDEPENDENT_AMBULATORY_CARE_PROVIDER_SITE_OTHER): Payer: BLUE CROSS/BLUE SHIELD | Admitting: Family Medicine

## 2015-09-05 VITALS — BP 158/102 | HR 91

## 2015-09-05 DIAGNOSIS — J019 Acute sinusitis, unspecified: Secondary | ICD-10-CM | POA: Diagnosis not present

## 2015-09-05 DIAGNOSIS — B9689 Other specified bacterial agents as the cause of diseases classified elsewhere: Secondary | ICD-10-CM

## 2015-09-05 LAB — POCT RAPID STREP A (OFFICE): RAPID STREP A SCREEN: NEGATIVE

## 2015-09-05 MED ORDER — FLUTICASONE PROPIONATE 50 MCG/ACT NA SUSP: 2.0000 | Freq: Every day | NASAL | Status: DC

## 2015-09-05 MED ORDER — AMOXICILLIN-POT CLAVULANATE ER 1000-62.5 MG PO TB12: 2.0000 | ORAL_TABLET | Freq: Two times a day (BID) | ORAL | Status: DC

## 2015-09-05 NOTE — Patient Instructions (Signed)
Thank you for coming to see me today. It was a pleasure. Today we talked about:   Acute sinusitis: I will treat with high dose antibiotics for 10 days. I want you to also use Flonase daily. If after two weeks, you are still having symptoms, we may need to consider follow-up with an ENT specialist  Please make an appointment to see Dr. Lamar Benes for follow-up.  If you have any questions or concerns, please do not hesitate to call the office at [phone].  Sincerely,  Cordelia Poche, MD

## 2015-09-05 NOTE — Progress Notes (Signed)
    Subjective   Summer Brewer is a 44 y.o. female that presents for a same day visit  1. Congestion: Symptoms started a few months ago. She has symptoms of anosmia and inability to taste. She also has associated ear fullness, post nasal drip, coughing, sneezing. No dyspnea. She is constantly congested. She has been treated for sinusitis with Augmentin and doxycycline in the last few months without relief. She has tried [facility] and [employer] pot, which has not helped. No fevers or sick contacts  ROS Per HPI  Social History  Substance Use Topics  . Smoking status: Never Smoker   . Smokeless tobacco: Never Used  . Alcohol Use: No    Allergies  Allergen Reactions  . Latex Hives and Itching    Objective   BP 158/102 mmHg  Pulse 91  General: Well appearing, no distress HEENT:   Head:  Normocephalic, left sided maxillary sinus tenderness  Eyes: Pupils equal and reactive to light/accomodation. Extraocular movements intact bilaterally.  Ears: Tympanic membranes normal bilaterally.  Nose/Throat: Nares patent bilaterally. Oropharnx clear and moist.  Neck: No cervical adenopathy bilaterally Respiratory/Chest: Clear to auscultation bilaterally. Unlabored work of breathing. No wheezing or rales. Cardiovascular: Regular rate and rhythm. Normal S1 and S2. No heart murmurs present. No extra heart sounds  Assessment and Plan   Meds ordered this encounter  Medications  . amoxicillin-clavulanate (AUGMENTIN XR) 1000-62.5 MG 12 hr tablet    Sig: Take 2 tablets by mouth 2 (two) times daily.    Dispense:  40 tablet    Refill:  0  . fluticasone (FLONASE) 50 MCG/ACT nasal spray    Sig: Place 2 sprays into both nostrils daily.    Dispense:  16 g    Refill:  6    Chronic bacterial sinusitis - high dose Augmentin treatment - May need ENT follow-up and possible CT if does not improve with current treatment

## 2015-09-11 ENCOUNTER — Telehealth: Payer: Self-pay | Admitting: *Deleted

## 2015-09-11 MED ORDER — LEVOFLOXACIN 500 MG PO TABS: 500.0000 mg | ORAL_TABLET | Freq: Every day | ORAL | Status: AC

## 2015-09-11 NOTE — Telephone Encounter (Signed)
[name] [phone]) left message stating that Augmentin 1000-62.5 XR is on back order form the manufacturer.  Rx was written on 09/05/15 by Dr. Lonny Prude for 40 tabs, however, he was only able to dispense 12 tabs to patient. Would like to know if there is another med that can be prescribed to finish order. Please advise Summer Brewer, Summer Brill, RN

## 2015-09-11 NOTE — Telephone Encounter (Signed)
Discontinued high dose augmentin. Levaquin 500mg  daily for 10 days sent to pharmacy.

## 2015-09-11 NOTE — Telephone Encounter (Signed)
Pt called to check the status of her medication issue. She is still sick and has not had the opportunity to take the medication since it is backordered at several pharmacies. Please call something else in and let patient know. jw

## 2015-12-06 ENCOUNTER — Encounter: Payer: Self-pay | Admitting: Internal Medicine

## 2015-12-06 ENCOUNTER — Telehealth: Payer: Self-pay | Admitting: Family Medicine

## 2015-12-06 ENCOUNTER — Ambulatory Visit (INDEPENDENT_AMBULATORY_CARE_PROVIDER_SITE_OTHER): Payer: BLUE CROSS/BLUE SHIELD | Admitting: Internal Medicine

## 2015-12-06 VITALS — BP 170/100 | HR 71 | Temp 98.3°F | Resp 14 | Ht 67.0 in | Wt 258.4 lb

## 2015-12-06 DIAGNOSIS — I10 Essential (primary) hypertension: Secondary | ICD-10-CM

## 2015-12-06 DIAGNOSIS — J019 Acute sinusitis, unspecified: Secondary | ICD-10-CM

## 2015-12-06 DIAGNOSIS — B9689 Other specified bacterial agents as the cause of diseases classified elsewhere: Secondary | ICD-10-CM

## 2015-12-06 MED ORDER — HYDROCHLOROTHIAZIDE 25 MG PO TABS: 25.0000 mg | ORAL_TABLET | Freq: Two times a day (BID) | ORAL | Status: DC

## 2015-12-06 MED ORDER — AMOXICILLIN-POT CLAVULANATE ER 1000-62.5 MG PO TB12: 2.0000 | ORAL_TABLET | Freq: Two times a day (BID) | ORAL | Status: DC

## 2015-12-06 NOTE — Progress Notes (Addendum)
[facility] Phone: [phone]  Subjective:  Sinus problem: She has been having sinus issues for months. She is having frontal headaches, congestion, loss of taste, and loss of smell for months. She has been seen here 3-4 times for sinus infections. She was given high dose Augmentin in 08/2015, which really helped. She only took it for 3 days, because that's all the pharmacy had. She was switched to Levaquin 500mg  daily, which didn't help. She started having diarrhea 3 days ago and then vomiting 2 days ago. She vomited 3 times. She was seen at the [facility] a week ago and prescribed regular dose Augmentin and a nasal spray. The regular dose Augmentin didn't help at all. She has had fevers to 100.7 and chills. She has a lot of sick contacts at work recently. She has also tried Afrin and Zyrtec.   Hypertension: Takes Lisinopril 40mg  daily and HCTZ 25mg  daily. She states her blood pressures have been high at all of her recent doctor's appointments but she keeps being told that her blood pressure is high because she is sick. She endorses some headaches that she thinks may be related to the high blood pressures. No chest pain, no lower extremity edema. She has not taken any OTC decongestants such as Sudafed.  ROS: See HPI for pertinent positives and negatives Past Medical History- HTN, GERD, obesity Reviewed problem list.  Medications- reviewed and updated Current Outpatient Prescriptions  Medication Sig Dispense Refill  . fluticasone (FLONASE) 50 MCG/ACT nasal spray Place 2 sprays into both nostrils daily. 16 g 6  . hydrochlorothiazide (HYDRODIURIL) 25 MG tablet Take 1 tablet (25 mg total) by mouth daily. 30 tablet 1  . hydrOXYzine (ATARAX/VISTARIL) 25 MG tablet Take 1 tablet (25 mg total) by mouth 3 (three) times daily as needed for itching. 30 tablet 1  . ibuprofen (ADVIL,MOTRIN) 400 MG tablet Take 1 tablet (400 mg total) by mouth every 6 (six) hours as needed. 28  tablet 0  . lisinopril (PRINIVIL,ZESTRIL) 40 MG tablet Take 1 tablet (40 mg total) by mouth daily. 30 tablet 1  . oxyCODONE-acetaminophen (PERCOCET/ROXICET) 5-325 MG tablet Take 1 tablet by mouth every 4 (four) hours as needed for severe pain. 40 tablet 0   No current facility-administered medications for this visit.   Chief complaint-noted Family history reviewed for today's visit. No changes. Social history- patient is a never smoker.  Objective: There were no vitals taken for this visit. Gen: NAD, alert, cooperative with exam HEENT: NCAT, EOMI, MMM, TMs normal, nasal turbinates are very edematous and erythematous, oropharynx normal Neck: FROM, supple, no cervical lymphadenopathy CV: RRR, no murmur Resp: CTABL, no wheezes, normal work of breathing Msk: Moves UE/LE spontaneously Neuro: Alert and oriented, no gross deficits Skin: No rashes, no lesions Psych: Appropriate behavior  Assessment/Plan: Acute Bacterial Sinusitis: Pt with many months of frontal headaches, congestion, copious rhinorrhea, and loss of smell and taste. Has been treated with many different antibiotics. The only thing that has improved her symptoms is high dose Augmentin.  - Will prescribe another course of Augmentin 1000-62.5mg  two tablets bid x 10 days - Given that she continues to have sinusitis that is refractory to standard treatments, will refer to ENT for further management. - Follow-up if symptoms have not improved after antibiotic course  Hypertension: BP 156/91 in clinic, 170/100 on recheck. Currently taking Lisinopril 40mg  daily and HCTZ 25mg  daily. Pt took her BP meds this morning. - Continue Lisinopril 40mg  daily - Will increase HCTZ  dose from 25mg  daily to 25mg  bid. New prescription sent. - Advised patient to return for appointment in 2 weeks to 1 month with her PCP for HTN follow-up.   Hyman Bible, MD PGY-1

## 2015-12-06 NOTE — Telephone Encounter (Signed)
Pt called because she was seen today by Dr. Brett Albino and given a written prescription for antibiotics. SH had trouble finding a place who had this. The only place she found was [facility] in [place]. The problem is that she threw up on the prescription and would like another faxed to the [facility]. jw

## 2015-12-06 NOTE — Telephone Encounter (Signed)
Medication sent to [facility] per patient request and left generic message that this was done. Jazmin Hartsell,CMA

## 2015-12-06 NOTE — Assessment & Plan Note (Signed)
BP 156/91 in clinic, 170/100 on recheck. Currently taking Lisinopril 40mg  daily and HCTZ 25mg  daily. Pt took her BP meds this morning. Has not taken any decongestants such as Sudafed. - Continue Lisinopril 40mg  daily - Will increase HCTZ dose from 25mg  daily to 25mg  bid. New prescription sent. - Advised patient to return for appointment in 2 weeks to 1 month with her PCP for HTN follow-up.

## 2015-12-06 NOTE — Assessment & Plan Note (Signed)
Pt with many months of frontal headaches, congestion, copious rhinorrhea, and loss of smell and taste. Has been treated with many different antibiotics. The only thing that has improved her symptoms is high dose Augmentin.  - Will prescribe another course of Augmentin 1000-62.5mg  two tablets bid x 10 days - Given that she continues to have sinusitis that is refractory to standard treatments, will refer to ENT for further management. - Follow-up if symptoms have not improved after antibiotic course

## 2015-12-06 NOTE — Patient Instructions (Addendum)
It was so nice to meet you!  Please call around to different pharmacies to see who currently has the high dose Augmentin in stock.  I have sent in a referral to the Ear, Nose, and Throat doctor. You should hear from our office in the next 2 weeks to schedule an appointment.  Your blood pressure was high in clinic today. I would like you to starting taking 1 tablet of HCTZ twice a day. Please come back to see [place] in 2 weeks-1 month so we can check your blood pressure again.  -Dr. Brett Albino

## 2016-01-03 ENCOUNTER — Telehealth: Payer: Self-pay | Admitting: Family Medicine

## 2016-01-03 NOTE — Telephone Encounter (Signed)
Pt brought in paperwork needed for medical leave.  She has mailed this to the clinic 2 times before but it has not been completed. Please fax to [phone] when complete Please let pt know when this has been done

## 2016-01-04 ENCOUNTER — Ambulatory Visit (INDEPENDENT_AMBULATORY_CARE_PROVIDER_SITE_OTHER): Payer: BLUE CROSS/BLUE SHIELD | Admitting: Family Medicine

## 2016-01-04 ENCOUNTER — Encounter: Payer: Self-pay | Admitting: Family Medicine

## 2016-01-04 VITALS — BP 149/102 | HR 77 | Temp 97.9°F | Wt 263.0 lb

## 2016-01-04 DIAGNOSIS — I10 Essential (primary) hypertension: Secondary | ICD-10-CM

## 2016-01-04 DIAGNOSIS — L309 Dermatitis, unspecified: Secondary | ICD-10-CM | POA: Diagnosis not present

## 2016-01-04 MED ORDER — TRIAMCINOLONE ACETONIDE 0.5 % EX OINT
1.0000 | TOPICAL_OINTMENT | Freq: Two times a day (BID) | CUTANEOUS | Status: DC | PRN
Start: 2016-01-04 — End: 2016-06-03

## 2016-01-04 MED ORDER — AMLODIPINE BESYLATE 5 MG PO TABS: 5.0000 mg | ORAL_TABLET | Freq: Every day | ORAL | Status: DC

## 2016-01-04 NOTE — Patient Instructions (Signed)
Sent in the ointment for your eczema Use it only as needed, stay hydrated other times with lots of lotion to keep skin moisturized  Completed FMLA paperwork. We will fax this in today.  Be well, Dr. Ardelia Mems

## 2016-01-04 NOTE — Progress Notes (Signed)
Date of Visit: 01/04/2016   HPI:  Patient presents for a same day appointment to discuss eczema.  Began flaring this week. Used a cream she had at home, does not know name of it. Needs more rx strength topical steroid since she's almost out. Flare up is on her arms. Otherwise no issues.  Also needs FMLA paperwork filled out for the 3 days of work she missed at the end of March due to having a bad sinus infection. Saw ENT and is going to have sinus surgery. She had sent [place] FMLA paperwork twice previously but it was likely discarded as her name recently changed due to getting married.  Blood pressure - elevated today. Taking lisinopril and hctz. Took medications this morning.    ROS: See HPI  Zachary: history of obesity, GERD, iron deficiency anemia, menorrhagia now with IUD in place, hypertension  PHYSICAL EXAM: BP 149/102 mmHg  Pulse 77  Temp(Src) 97.9 F (36.6 C) (Oral)  Wt 263 lb (119.296 kg) Gen: NAD, pleasant, cooperative HEENT: normocephalic, atraumatic  Skin: papular rash to antecubital fossa bilaterally, no skin breakdown  ASSESSMENT/PLAN:  Eczema Flare. rx topical steroid Discussed limiting use, keeping skin moisturized.  HYPERTENSION, BENIGN SYSTEMIC Uncontrolled. Question white coat hypertension. Schedule in pharmacy clinic for 24 hour ambulatory blood pressure monitoring Add amlodipine 5mg  daily  FMLA paperwork completed for the 3 days of work she missed, copy given to patient. Faxed to # provided. Will scan copy into chart.  FOLLOW UP: Schedule appointment for ambulatory blood pressure monitoring  [place] J. Ardelia Mems, [place]

## 2016-01-06 NOTE — Assessment & Plan Note (Signed)
Flare. rx topical steroid Discussed limiting use, keeping skin moisturized.

## 2016-01-07 NOTE — Assessment & Plan Note (Signed)
Uncontrolled. Question white coat hypertension. Schedule in pharmacy clinic for 24 hour ambulatory blood pressure monitoring Add amlodipine 5mg  daily

## 2016-01-16 ENCOUNTER — Other Ambulatory Visit: Payer: Self-pay | Admitting: Otolaryngology

## 2016-03-28 ENCOUNTER — Ambulatory Visit: Payer: BLUE CROSS/BLUE SHIELD | Admitting: Internal Medicine

## 2016-06-03 ENCOUNTER — Ambulatory Visit (INDEPENDENT_AMBULATORY_CARE_PROVIDER_SITE_OTHER): Payer: BLUE CROSS/BLUE SHIELD | Admitting: Internal Medicine

## 2016-06-03 ENCOUNTER — Encounter: Payer: Self-pay | Admitting: Internal Medicine

## 2016-06-03 DIAGNOSIS — L299 Pruritus, unspecified: Secondary | ICD-10-CM | POA: Diagnosis not present

## 2016-06-03 DIAGNOSIS — R4589 Other symptoms and signs involving emotional state: Secondary | ICD-10-CM | POA: Insufficient documentation

## 2016-06-03 DIAGNOSIS — F329 Major depressive disorder, single episode, unspecified: Secondary | ICD-10-CM

## 2016-06-03 MED ORDER — DOXEPIN HCL 25 MG PO CAPS: 25.0000 mg | ORAL_CAPSULE | Freq: Every day | ORAL | 0 refills | Status: DC

## 2016-06-03 MED ORDER — CITALOPRAM HYDROBROMIDE 20 MG PO TABS: 20.0000 mg | ORAL_TABLET | Freq: Every day | ORAL | 0 refills | Status: DC

## 2016-06-03 MED ORDER — TRIAMCINOLONE ACETONIDE 0.5 % EX OINT: 1.0000 "application " | TOPICAL_OINTMENT | Freq: Two times a day (BID) | CUTANEOUS | 0 refills | Status: DC | PRN

## 2016-06-03 NOTE — Assessment & Plan Note (Addendum)
PHQ-9 is a 20 in clinic today. Patient notes that her depressed mood is due to her husband being arrested for a DUI. She has taken Zoloft in the past for depression and anxiety, but stopped taking this recently because she doesn't like the way it makes her feel. No SI/HI. Patient did not want to meet with behavioral health consultant in clinic today. - Will start Doxepin to help with depressed mood and itching - If mood is not improving, can consider increasing dose of doxepin or adding on an SSRI - Follow-up with PCP in 2-4 weeks. - Precepted with Dr. Wendy Poet

## 2016-06-03 NOTE — Assessment & Plan Note (Signed)
The patient states that she breaks out in hives whenever she gets stressed. She states she has been getting hives on her arms and legs, but no hives are noted on exam today. - Patient prescribed Doxepin 25 mg daily at bedtime for itching, as she has not had relief with Benadryl, hydroxyzine, or hydrocortisone cream. Doxepin will likely help with depressed mood as well. - Follow up as needed - Precepted with Dr. Wendy Poet

## 2016-06-03 NOTE — Patient Instructions (Addendum)
It was so nice to see you!  I have started you on a medication called Doxepin, which can help with depression, anxiety, and itching. Please take 1 tablet at night.  It is very important that you be seen by your PCP in the next 2-4 weeks to talk more about your mood.  -Dr. Brett Albino

## 2016-06-03 NOTE — Progress Notes (Signed)
   [facility] Phone: [phone]  Subjective:  Summer Brewer is a 45 year old female presenting to same-day clinic with hives for the last 3 days. The hives are located on her arms, legs, and lower back. The hives are very itchy. She denies using any new products, including soaps, lotions, perfumes, detergents, etc. She states that she has gotten hives in the past when something stressful happens in her life. This happens a couple times a year. Recently, her husband was arrested for a DUI and he was just sentenced to a few years in prison, because he has had DUIs in the past. She states that this has been very stressful for her, as they just got married a few months ago. She states that her "nerves are shot". She states she has been having problems with depressed mood, feeling tired, lack of interest, and trouble sleeping. She denies suicidal or homicidal ideations. For the hives, she has tried using Hydroxyzine, Benadryl, and Hydrocortisone cream. None of these interventions have helped. She would like a new medication for itching. She was previously taking Zoloft for anxiety, but she stopped taking this because she "didn't like the way it made her feel".   ROS: See HPI for pertinent positives and negatives  Past Medical History- hypertension, GERD, dysmenorrhea, obesity  Family history reviewed for today's visit. No changes.  Social history- patient is a never smoker.   Objective: Ht 5\' 7"  (1.702 m)   Wt 265 lb (120.2 kg)   BMI 41.50 kg/m  Gen: NAD, alert, cooperative with exam Skin: Dry skin and excoriations present in the antecubital fossa bilaterally. No hives noted. Psych: Depressed affect, normal thought content  Assessment/Plan: Itchy Skin: The patient states that she breaks out in hives whenever she gets stressed. She states she has been getting hives on her arms and legs, but no hives are noted on exam today. - Patient prescribed Doxepin 25 mg daily at bedtime  for itching, as she has not had relief with Benadryl, hydroxyzine, or hydrocortisone cream. Doxepin will likely help with depressed mood as well. - Follow up as needed - Precepted with Dr. McDiarmid  Depressed Mood: PHQ-9 is a 20 in clinic today. Patient notes that her depressed mood is due to her husband being arrested for a DUI. She has taken Zoloft in the past for depression and anxiety, but stopped taking this recently because she doesn't like the way it makes her feel. No SI/HI. Patient did not want to meet with behavioral health consultant in clinic today. - Will start Doxepin to help with depressed mood and itching - If mood is not improving, can consider increasing dose of doxepin or adding on an SSRI - Follow-up with PCP in 2-4 weeks. - Precepted with Dr. Wendy Poet   Hyman Bible, MD PGY-2

## 2016-06-04 ENCOUNTER — Telehealth: Payer: Self-pay | Admitting: *Deleted

## 2016-06-04 NOTE — Telephone Encounter (Signed)
Spoke with patient on the phone. Agree with Jeanett Schlein- patient should stop taking the Celexa. I apologized to the patient for this mistake and she voiced understanding.

## 2016-06-04 NOTE — Telephone Encounter (Signed)
Patient calling because she had picked up Rx for gen. Celexa from [facility] yesterday.  Patient took one tablet and was then informed by pharmacy that she was not supposed to have the prescription.  Patient upset because she was "given the wrong medication".  Explained to patient that med was sent to pharmacy yesterday and canceled 8 minutes afterwards.  Unfortunately, pharmacy did not receive notification that citalopram was canceled until after she had picked up medication.  Informed that patient d/c med and taking one tablet will not cause harm.  Patient said "Ok" and hung up.  Burna Forts, BSN, RN-BC

## 2016-06-27 ENCOUNTER — Other Ambulatory Visit: Payer: Self-pay | Admitting: *Deleted

## 2016-06-27 MED ORDER — DOXEPIN HCL 25 MG PO CAPS: 25.0000 mg | ORAL_CAPSULE | Freq: Every day | ORAL | 0 refills | Status: DC

## 2016-06-27 NOTE — Telephone Encounter (Signed)
Refill request for 90 day supply.  Kilian Schwartz L, RN  

## 2016-06-27 NOTE — Telephone Encounter (Signed)
Patient needs to be seen by me. Will prescribe Doxepin for 30 days.

## 2016-07-01 ENCOUNTER — Other Ambulatory Visit: Payer: Self-pay | Admitting: *Deleted

## 2016-07-11 ENCOUNTER — Telehealth: Payer: Self-pay | Admitting: Internal Medicine

## 2016-07-11 NOTE — Telephone Encounter (Signed)
Please let patient know that I can not fill out paperwork which she dropped off without seeing the patient.

## 2016-07-16 NOTE — Telephone Encounter (Signed)
Patient again demanding to speak with PCP regarding paperwork and why she wasn't informed of the need for an appointment prior to the due date for her forms.

## 2016-07-16 NOTE — Telephone Encounter (Addendum)
Dr. Brett Albino can not fill out the FMLA form. Patient will have to come in and be seen by her primary care physician to have this paperwork filled out. Please let patient know. Thanks

## 2016-07-16 NOTE — Telephone Encounter (Signed)
Pt was seen by dr Brett Albino sept 27 for her anxiety.  Dr Brett Albino wrote her out of work for a week. Could the FMLA  paperwork be completed by dr Brett Albino since she saw the pt?  Pt employer told her they needed to be completed by Nov 1. Pts job is in [place]. Please advise.

## 2016-07-16 NOTE — Telephone Encounter (Signed)
Patietn informed of message from MD, patient upset asking that MD call her. She states this paperwork was supposed to have been turned in on 11/1 or she could be terminated. Patient requests phone call from PCP as soon as possible.

## 2016-07-17 ENCOUNTER — Ambulatory Visit: Payer: BLUE CROSS/BLUE SHIELD | Admitting: Internal Medicine

## 2016-07-17 NOTE — Telephone Encounter (Signed)
Called patient again this morning and asked her to please bring in her FMLA forms as I do not have all of the FMLA form pages. She states that she does not have the original form and does not want to go into work to get it today. She is worried she is going to lose her job. She states that I have disrespected her and she does not want to be seen by me or have her children be seen by me. She indicates that she doesn't know if she is going to come into her appointment today. She again states that I have been incompetent and that this is my fault. I explained that I am sorry about her frustration and would be happy to see her. If she chooses not to come in today that's her choice, however I cannot provide her FMLA papers without seeing her.

## 2016-07-17 NOTE — Telephone Encounter (Signed)
I called patient to discuss her FMLA form. Patient states that she doesn't understand why form cannot be filled out. She states that she doesn't understand why she has to come in, pay a co-pay and be seen to have her FMLA filled out. She said that I was "incompetent and that she wants another doctor." I explained to her that was unreasonable to expect to have this form filled out to when she only sent in by fax on 07/07/2016 and has not been seen for follow up since 9/26. Patient states that she sent in the form, earlier in the month and it had been lost and that this was our fault. I apologize for her frustration, however indicated that I could only go on what I received in my inbox on 07/09/2016.

## 2016-08-04 ENCOUNTER — Other Ambulatory Visit: Payer: Self-pay | Admitting: *Deleted

## 2016-08-05 MED ORDER — DOXEPIN HCL 25 MG PO CAPS: 25.0000 mg | ORAL_CAPSULE | Freq: Every day | ORAL | 0 refills | Status: DC

## 2016-08-18 ENCOUNTER — Ambulatory Visit (INDEPENDENT_AMBULATORY_CARE_PROVIDER_SITE_OTHER): Payer: BLUE CROSS/BLUE SHIELD | Admitting: Internal Medicine

## 2016-08-18 ENCOUNTER — Encounter: Payer: Self-pay | Admitting: Internal Medicine

## 2016-08-18 DIAGNOSIS — R4589 Other symptoms and signs involving emotional state: Secondary | ICD-10-CM

## 2016-08-18 DIAGNOSIS — F329 Major depressive disorder, single episode, unspecified: Secondary | ICD-10-CM | POA: Diagnosis not present

## 2016-08-18 MED ORDER — CITALOPRAM HYDROBROMIDE 20 MG PO TABS: 20.0000 mg | ORAL_TABLET | Freq: Every day | ORAL | 1 refills | Status: DC

## 2016-08-18 NOTE — Progress Notes (Signed)
   [facility] Phone: [phone]  Subjective:  Summer Brewer is a 45 year old female presenting to clinic for follow-up of depression and for completion of FMLA paperwork. She was seen in clinic on 06/03/2016 and was started on doxepin for a combination of depression and itchy skin. She states the doxepin has been helping. She notes that it makes her drowsy, so she takes them at night. She has not noted any other side effects. She states that she has good and bad days with her depression. She states that over the last 3 months, she has had 3 days where she has felt that her depression was so bad that she couldn't go into work. She feels like she has a lot of stress with her husband being sentenced to 4 years in prison and with being a single mom to her 2 children. She endorses occasional difficulty with sleep. She endorses occasional problems with concentration. She denies SI or HI.  ROS: See HPI for pertinent positives and negatives  Past Medical History- hypertension, depression, anxiety  Family history reviewed for today's visit. No changes.  Social history- patient is a never smoker  Objective: BP (!) 156/94   Pulse 86   Temp 98.7 F (37.1 C) (Oral)   Wt 265 lb (120.2 kg)   SpO2 98%   BMI 41.50 kg/m  Gen: NAD, alert, cooperative with exam HEENT: NCAT, EOMI, MMM Resp: Normal work of breathing Psych: Speaks slowly, flat affect, normal thought content, normal behavior, no SI or HI  Assessment/Plan: Depressed Mood: Patient states that her depression has improved since being on doxepin. At her last visit PHQ-9 score was a 20. Repeat PHQ-9 score is an 8 today. Patient states her depression is still not where she wants it to be. She would like to add on an additional medication today. - Will start Celexa 20 mg daily. - Continue doxepin - FMLA paperwork filled out today after discussion with preceptor Dr. Erin Hearing - Patient should follow up in 4 weeks to determine if  Celexa is helping. - May need to consider getting an EKG at that time to monitor for QT prolongation, which can happen with a combination of Celexa and doxepin. Last EKG was performed in 2016 and showed a normal QT interval.   Hyman Bible, MD PGY-2

## 2016-08-18 NOTE — Patient Instructions (Signed)
It was so nice to see you!  I have started you on Celexa. Please take 20mg  daily.  Please come back to see [place] in 4 weeks!  -Dr. Brett Albino

## 2016-08-18 NOTE — Assessment & Plan Note (Signed)
Patient states that her depression has improved since being on doxepin. At her last visit PHQ-9 score was a 20. Repeat PHQ-9 score is an 8 today. Patient states her depression is still not where she wants it to be. She would like to add on an additional medication today. - Will start Celexa 20 mg daily. - Continue doxepin - FMLA paperwork filled out today after discussion with preceptor Dr. Erin Hearing - Patient should follow up in 4 weeks to determine if Celexa is helping. - May need to consider getting an EKG at that time to monitor for QT prolongation, which can happen with a combination of Celexa and doxepin. Last EKG was performed in 2016 and showed a normal QT interval.

## 2016-10-02 ENCOUNTER — Other Ambulatory Visit: Payer: Self-pay | Admitting: *Deleted

## 2016-10-02 MED ORDER — DOXEPIN HCL 25 MG PO CAPS: 25.0000 mg | ORAL_CAPSULE | Freq: Every day | ORAL | 0 refills | Status: DC

## 2016-10-06 ENCOUNTER — Telehealth: Payer: Self-pay | Admitting: *Deleted

## 2016-10-06 NOTE — Telephone Encounter (Signed)
Patient calling requesting to have dates and comments updated on her recent FLMA form from 08/2016.  Patient requesting to have dates of 07/07/16 and 07/22/16 added to dates of treatment (Part A  - #1) and change No to Yes for Part B - #5 with dates of incapacity being 07/22/16 - 07/31/16 and changing frequency from unknown since it "contradicts" with answer #5 being No.    Recommended changes made per Dr. Brett Albino to original FMLA form, along with adding frequency to "1-2 times per month lasting 1-2 days" over 6 month period.  FMLA form faxed to Cassell Clement at [place] [address] [phone].  Patient verified that updated  FMLA form was received by Ms. Slate.  Placed revised FMLA form in "to be scanned" box to send to [employer].  Burna Forts, BSN, RN-BC

## 2016-10-27 ENCOUNTER — Other Ambulatory Visit: Payer: Self-pay | Admitting: *Deleted

## 2016-10-27 MED ORDER — DOXEPIN HCL 25 MG PO CAPS: 25.0000 mg | ORAL_CAPSULE | Freq: Every day | ORAL | 0 refills | Status: DC

## 2016-11-07 ENCOUNTER — Telehealth: Payer: Self-pay | Admitting: Internal Medicine

## 2016-11-07 NOTE — Telephone Encounter (Signed)
Pt was seen in a urgent care in Feb on 2 different occasions-once for the flu and the other for bronchitis.  Her employer requires FMLA papers to be completed for the absences.  Urgent Care will not complete FMLA paperwork.  Pt is wanting her PCP to complete the forms.  She asked to speak to [facility]

## 2016-11-20 ENCOUNTER — Ambulatory Visit (INDEPENDENT_AMBULATORY_CARE_PROVIDER_SITE_OTHER): Payer: BLUE CROSS/BLUE SHIELD | Admitting: Internal Medicine

## 2016-11-20 ENCOUNTER — Encounter: Payer: Self-pay | Admitting: Student

## 2016-11-20 ENCOUNTER — Telehealth: Payer: Self-pay | Admitting: *Deleted

## 2016-11-20 ENCOUNTER — Ambulatory Visit (HOSPITAL_COMMUNITY): Payer: BLUE CROSS/BLUE SHIELD

## 2016-11-20 VITALS — BP 150/98 | HR 96 | Temp 98.3°F | Wt 267.0 lb

## 2016-11-20 DIAGNOSIS — H538 Other visual disturbances: Secondary | ICD-10-CM | POA: Diagnosis not present

## 2016-11-20 DIAGNOSIS — I10 Essential (primary) hypertension: Secondary | ICD-10-CM

## 2016-11-20 MED ORDER — AMLODIPINE BESYLATE 5 MG PO TABS: 10.0000 mg | ORAL_TABLET | Freq: Every day | ORAL | 3 refills | Status: DC

## 2016-11-20 NOTE — Telephone Encounter (Signed)
Patient called and states that she has been experiencing headaches x 4 days and also has had some episodes of blurry vision and "floaters".  She also informed me that her eyes are blood shot red.  Patient states that she hasn't missed a dose of any of her blood pressure medications and takes all 3 of them at the same time.  Patient reports that her BP yesterday was 180/121 and it was 170/113 today.  Informed patient that we could work her in this afternoon with her PCP at 1:30pm since she declined seeking care at the ED due to not having any openings this morning.  Patient voiced understanding and will plan to be here around 1:15pm to get checked in.  Provider informed of this and also the CMA she is going to be working with.  Johnney Ou

## 2016-11-20 NOTE — Patient Instructions (Addendum)
It was so nice to see you!  I have ordered an MRI. We will get this scheduled for you.   I have referred you to the eye doctor. You should hear from our office very soon to schedule this appointment.  I have increased the dose of your Norvasc from 5mg  to 10mg  daily.  Please come back to see [place] in 2 weeks for blood pressure follow-up.  -Dr. Brett Albino

## 2016-11-20 NOTE — Progress Notes (Signed)
   [facility] Phone: [phone]  Subjective:  Summer Brewer is a 46 year old female presenting to clinic with headaches x 4 days. She describes the pain as "throbbing" that occurs behind her eyes. The pain does not radiate. She has been taking Ibuprofen, which hasn't helped. She states she has also had constant blurry vision and "floaters" over the last couple of days. She notes that she has had episodes of headache and blurry vision on and off over the last year. She has been taking her blood pressure medications as prescribed. She is currently taking Norvasc 5mg  daily, HCTZ 25mg  bid, and Lisinopril 40mg  daily. Her blood pressure was 180/121 yesterday and 170/113 this morning. No chest pain, no shortness of breath, no nausea, no vomiting.  ROS: See HPI for pertinent positives and negatives  Past Medical History- HTN, GERD, obesity, depression/anxiety  Family history reviewed for today's visit. No changes.  Social history- patient is a never smoker  Objective: BP (!) 150/98   Pulse 96   Temp 98.3 F (36.8 C) (Oral)   Wt 267 lb (121.1 kg)   SpO2 97%   BMI 41.82 kg/m  Gen: NAD, alert, cooperative with exam HEENT: NCAT, EOMI, MMM, PERRLA Neck: FROM, supple CV: RRR, no murmur Resp: CTABL, no wheezes, normal work of breathing Neuro: Alert and oriented, CN 2-12 intact, normal finger-to-nose testing bilaterally, reflexes normal and symmetric, 5/5 strength in upper and lower extremities, sensation intact to light touch. Skin: No rashes or lesions on exposed skin Psych: Speaks in quiet voice, normal rate of speech, normal behavior  Assessment/Plan: Blurred Vision/Headaches: Has been on and off for the last year. Concern for pseudotumor cerebri, given her obesity and headaches associated with blurred vision over the last year. She could also be having headaches and blurred vision in the setting of hypertension, although she notes headache and blurred vision in the office  when her BP was 150/98. Migraines are also on the differential. Lastly, anxiety may be contributing, as Pt states her anxiety has not been well controlled recently. - MRI brain ordered to rule out pseudotumor cerebri. If shows findings consistent with pseudotumor, will refer to Neurology - Referral placed to ophthalmology  HTN: Uncontrolled. BPs elevated at home recently to 180/121 and 170/113. Currently taking Norvasc 5mg  daily, HCTZ 25mg  daily, and Lisinopril 40mg  daily. Anxiety and current headache may be contributing. - Will increase Norvasc to 10mg  daily - Follow-up in 2 weeks for repeat BP   Hyman Bible, MD PGY-2

## 2016-11-24 DIAGNOSIS — H538 Other visual disturbances: Secondary | ICD-10-CM | POA: Insufficient documentation

## 2016-11-24 NOTE — Assessment & Plan Note (Addendum)
Uncontrolled. BPs elevated at home recently to 180/121 and 170/113. Currently taking Norvasc 5mg  daily, HCTZ 25mg  daily, and Lisinopril 40mg  daily. Anxiety and current headache may be contributing. - Will increase Norvasc to 10mg  daily - Follow-up in 2 weeks for repeat BP

## 2016-11-24 NOTE — Assessment & Plan Note (Signed)
Has been on and off for the last year. Concern for pseudotumor cerebri, given her obesity and headaches associated with blurred vision over the last year. She could also be having headaches and blurred vision in the setting of hypertension, although she notes headache and blurred vision in the office when her BP was 150/98. Migraines are also on the differential. Lastly, anxiety may be contributing, as Pt states her anxiety has not been well controlled recently. - MRI brain ordered to rule out pseudotumor cerebri. If shows findings consistent with pseudotumor, will refer to Neurology - Referral placed to ophthalmology

## 2016-11-28 ENCOUNTER — Other Ambulatory Visit: Payer: Self-pay | Admitting: Internal Medicine

## 2016-11-28 ENCOUNTER — Ambulatory Visit (HOSPITAL_COMMUNITY)
Admission: RE | Admit: 2016-11-28 | Discharge: 2016-11-28 | Disposition: A | Discharge status: Home / Self Care | Payer: BLUE CROSS/BLUE SHIELD | Source: Ambulatory Visit | Attending: Family Medicine | Admitting: Family Medicine

## 2016-11-28 DIAGNOSIS — H538 Other visual disturbances: Secondary | ICD-10-CM | POA: Insufficient documentation

## 2016-11-28 DIAGNOSIS — J324 Chronic pansinusitis: Secondary | ICD-10-CM | POA: Insufficient documentation

## 2016-12-01 ENCOUNTER — Telehealth: Payer: Self-pay | Admitting: Internal Medicine

## 2016-12-01 ENCOUNTER — Other Ambulatory Visit: Payer: Self-pay | Admitting: Internal Medicine

## 2016-12-01 NOTE — Telephone Encounter (Signed)
Pt returned drs call °

## 2016-12-01 NOTE — Telephone Encounter (Signed)
Attempted to call patient to discuss the results of her MRI. Left voicemail stating that she should call us back.

## 2016-12-02 NOTE — Telephone Encounter (Signed)
Pt is still waiting on a call from dr Brett Albino

## 2016-12-03 NOTE — Telephone Encounter (Signed)
Returned call and it went to voicemail. Will try calling again later today.

## 2016-12-22 ENCOUNTER — Encounter: Payer: Self-pay | Admitting: Family Medicine

## 2016-12-22 ENCOUNTER — Ambulatory Visit (INDEPENDENT_AMBULATORY_CARE_PROVIDER_SITE_OTHER): Payer: BLUE CROSS/BLUE SHIELD | Admitting: Family Medicine

## 2016-12-22 DIAGNOSIS — I10 Essential (primary) hypertension: Secondary | ICD-10-CM | POA: Diagnosis not present

## 2016-12-22 MED ORDER — PROPRANOLOL HCL ER 80 MG PO CP24: 80.0000 mg | ORAL_CAPSULE | Freq: Every day | ORAL | 1 refills | Status: DC

## 2016-12-22 NOTE — Progress Notes (Signed)
    Subjective:  Summer Brewer is a 46 y.o. female who presents to the [facility] today with a chief complaint of HTN.   HPI:  HTN Checking BP at home in the morning and through the day. Currently on amlodipine, HCTZ, and lisinopril which she is tolerating well without side effects. Systolics usually running in the 160s-180s. Is having headaches everyday that she thinks are migraines. No chest pain or shortness of breath. No recent medication changes.   ROS: Per HPI  Objective:  Physical Exam: BP (!) 152/94   Pulse 82   Temp 98.8 F (37.1 C) (Oral)   Wt 270 lb (122.5 kg)   SpO2 97%   BMI 42.29 kg/m   Gen: NAD, resting comfortably CV: RRR with no murmurs appreciated Pulm: NWOB, CTAB with no crackles, wheezes, or rhonchi MSK: no edema, cyanosis, or clubbing noted Skin: warm, dry Neuro: grossly normal, moves all extremities Psych: Normal affect and thought content  Assessment/Plan:  HYPERTENSION, BENIGN SYSTEMIC Still above goal at max doses of lisinopril, norvasc, and HCTZ. Given her concurrent daily headaches, will start propranolol 80mg  daily today. Follow up in 2 weeks. If still persistently elevated, consider addition of spironolactone for resistant hypertension or increasing dose of propranolol. Return precautions reviewed.   Algis Greenhouse. Jerline Pain, [facility] Resident PGY-3 12/22/2016 2:08 PM

## 2016-12-22 NOTE — Assessment & Plan Note (Signed)
Still above goal at max doses of lisinopril, norvasc, and HCTZ. Given her concurrent daily headaches, will start propranolol 80mg  daily today. Follow up in 2 weeks. If still persistently elevated, consider addition of spironolactone for resistant hypertension or increasing dose of propranolol. Return precautions reviewed.

## 2016-12-22 NOTE — Patient Instructions (Signed)
Your blood pressure is still a little high. This is likely contributing to your headaches.  Please start the propranolol. Come back to see [place] in 2 weeks for a recheck. Keep checking your blood pressure at home. I would like for your top number to be less than 140 and your bottom number less than 90.  Take care,  Dr Jerline Pain

## 2016-12-23 ENCOUNTER — Telehealth: Payer: Self-pay

## 2016-12-23 ENCOUNTER — Other Ambulatory Visit: Payer: Self-pay | Admitting: Internal Medicine

## 2016-12-23 DIAGNOSIS — I10 Essential (primary) hypertension: Secondary | ICD-10-CM

## 2016-12-23 NOTE — Telephone Encounter (Signed)
Can we schedule patient for renal US? She said that Fridays are best for her because she is off work that day. Thank you!

## 2016-12-23 NOTE — Telephone Encounter (Signed)
Attempted to contact pt- no answer, VM was left informing of [place] scheduled for 4/20 @830am  over at [facility]. NPO instructions given. Call back number left for patient to call if any questions.

## 2016-12-23 NOTE — Progress Notes (Signed)
Patient seen 4/16 by Dr. Jerline Pain in clinic with hypertension despite maximum doses of three blood pressure medications. BPs have been in the 768T-157W systolics. She is also having associated daily headaches. I have seen her for the headaches in the past and was concerned for pseudotumor cerebri, as she is obese and had associated blurred vision. MRI head was negative for findings concerning for pseudotumor. She was also referred to ophthalmology and was given glasses for blurred vision. The glasses have not helped the headaches. Unsure if her headaches are related to her hypertension or if she is having migraines vs tension headaches.  She is currently taking Lisinopril 40mg  daily, Norvasc 10mg  daily, and HCTZ 25mg  bid. She states she takes her blood pressure medications every day.  Patient will need work-up for resistant hypertension. - Will obtain labs- CBC, BMP, TSH, UA, UPC, plasma aldosterone, and plasma renin - Will also order renal artery duplex US to rule out renal artery stenosis - Referral placed to sleep clinic for sleep study to rule out OSA as a cause of her resistant hypertension and daily headaches. Patient states she does not feel refreshed in the morning when she wakes up.  Discussed the plan with patient over the phone and she voiced understanding.   Hyman Bible, MD

## 2016-12-23 NOTE — Telephone Encounter (Signed)
Discussed with patient over the phone and precepted with Dr. Nori Riis. Advised that patient should stop the Propranolol since it is causing her to feel so terrible. At this point, I think patient needs to be worked up for resistant hypertension. Will place orders for routine labs- CBC, BMET, TSH, UA, UPC, plasma aldosterone, and plasma renin. Will also send patient for sleep study to rule out OSA and renal arterial duplex US to rule out renal artery stenosis.

## 2016-12-23 NOTE — Telephone Encounter (Signed)
New medication is making her dizzy.  Pharm said medication could cause this. Headache was worse last night. Laying down in a dark room, eyes shut will help ease off the pain but it comes back. What can be done for pain and can we write her a note to be out of work for the next 3-4 days until medicine gets into her system and she stops spinning. Please call pt at  [phone]. Ottis Stain, CMA

## 2016-12-24 ENCOUNTER — Encounter: Payer: Self-pay | Admitting: Internal Medicine

## 2016-12-24 ENCOUNTER — Other Ambulatory Visit (INDEPENDENT_AMBULATORY_CARE_PROVIDER_SITE_OTHER): Payer: BLUE CROSS/BLUE SHIELD

## 2016-12-24 ENCOUNTER — Telehealth: Payer: Self-pay | Admitting: Internal Medicine

## 2016-12-24 DIAGNOSIS — I10 Essential (primary) hypertension: Secondary | ICD-10-CM

## 2016-12-24 LAB — POCT UA - MICROSCOPIC ONLY

## 2016-12-24 LAB — POCT URINALYSIS DIP (MANUAL ENTRY)
Bilirubin, UA: NEGATIVE
GLUCOSE UA: NEGATIVE mg/dL
Ketones, POC UA: NEGATIVE mg/dL
LEUKOCYTES UA: NEGATIVE
NITRITE UA: NEGATIVE
Protein Ur, POC: NEGATIVE mg/dL
Spec Grav, UA: 1.02 (ref 1.010–1.025)
Urobilinogen, UA: 0.2 E.U./dL
pH, UA: 7 (ref 5.0–8.0)

## 2016-12-24 NOTE — Telephone Encounter (Signed)
[facility] called because the appointment we had schedule for the patient on 12/26/16 they have cancels because they patient needs to be seen at vascular for this type of xray. jw

## 2016-12-25 ENCOUNTER — Other Ambulatory Visit: Payer: Self-pay | Admitting: Internal Medicine

## 2016-12-25 DIAGNOSIS — I10 Essential (primary) hypertension: Secondary | ICD-10-CM

## 2016-12-25 LAB — PROTEIN / CREATININE RATIO, URINE
Creatinine, Urine: 194 mg/dL
PROTEIN UR: 19.5 mg/dL
Protein/Creat Ratio: 101 mg/g creat (ref 0–200)

## 2016-12-25 NOTE — Telephone Encounter (Signed)
Will forward to MD to place a new order. Annabelle Rexroad,CMA

## 2016-12-25 NOTE — Telephone Encounter (Signed)
New order placed

## 2016-12-25 NOTE — Telephone Encounter (Signed)
Scheduled patient's appointment with [facility].  It will be on Tuesday April 24th at [facility].  Patient will need to have no food after 11pm only clear liquid.  No food that may cause gas (green leafy foods) 24 hours prior to the procedure and no breakfast, gum or carbonated drinks on the morning of procedure.  Patient may take her medications.  LM for patient to call back.  Jazmin Hartsell,CMA

## 2016-12-25 NOTE — Addendum Note (Signed)
Addended by: Valerie Roys on: 12/25/2016 04:23 PM   Modules accepted: Orders

## 2016-12-26 ENCOUNTER — Ambulatory Visit (HOSPITAL_COMMUNITY): Payer: BLUE CROSS/BLUE SHIELD

## 2016-12-26 NOTE — Telephone Encounter (Signed)
Pt returned our phone call from yesterday. Pt was very upset that no one contacted her to inform her of her canceled apt for this morning. Per note from Jazmin, a VM was left for the pt yesterday informing her of this. Pt states she never received a call or VM. I apologized to pt for the break down in communication. Pt informed me she rescheduled her apt herself, since the rescheduled apt we made for her did not work. Pt and I were then disconnected. I assume she hung up. Will forward to pcp for just an fyi.

## 2016-12-28 ENCOUNTER — Other Ambulatory Visit: Payer: Self-pay | Admitting: Internal Medicine

## 2016-12-30 ENCOUNTER — Encounter (HOSPITAL_COMMUNITY): Payer: Self-pay | Admitting: Internal Medicine

## 2016-12-30 ENCOUNTER — Ambulatory Visit (HOSPITAL_COMMUNITY)
Admission: RE | Admit: 2016-12-30 | Discharge: 2016-12-30 | Disposition: A | Discharge status: Home / Self Care | Payer: BLUE CROSS/BLUE SHIELD | Source: Ambulatory Visit | Attending: Family Medicine | Admitting: Family Medicine

## 2016-12-30 ENCOUNTER — Ambulatory Visit (HOSPITAL_COMMUNITY): Payer: BLUE CROSS/BLUE SHIELD

## 2016-12-30 DIAGNOSIS — I701 Atherosclerosis of renal artery: Secondary | ICD-10-CM | POA: Diagnosis not present

## 2016-12-30 DIAGNOSIS — I10 Essential (primary) hypertension: Secondary | ICD-10-CM | POA: Diagnosis not present

## 2016-12-30 LAB — ALDOSTERONE + RENIN ACTIVITY W/ RATIO: ALDOSTERONE: 8.3 ng/dL (ref 0.0–30.0)

## 2016-12-30 LAB — BASIC METABOLIC PANEL
BUN/Creatinine Ratio: 13 (ref 9–23)
BUN: 13 mg/dL (ref 6–24)
CALCIUM: 9.5 mg/dL (ref 8.7–10.2)
CO2: 24 mmol/L (ref 18–29)
CREATININE: 0.98 mg/dL (ref 0.57–1.00)
Chloride: 104 mmol/L (ref 96–106)
GFR, EST AFRICAN AMERICAN: 82 mL/min/{1.73_m2} (ref >59)
GFR, EST NON AFRICAN AMERICAN: 71 mL/min/{1.73_m2} (ref >59)
Glucose: 81 mg/dL (ref 65–99)
Potassium: 4.3 mmol/L (ref 3.5–5.2)
Sodium: 141 mmol/L (ref 134–144)

## 2016-12-30 LAB — CBC
HEMATOCRIT: 40.4 % (ref 34.0–46.6)
HEMOGLOBIN: 13.3 g/dL (ref 11.1–15.9)
MCH: 26.7 pg (ref 26.6–33.0)
MCHC: 32.9 g/dL (ref 31.5–35.7)
MCV: 81 fL (ref 79–97)
Platelets: 202 10*3/uL (ref 150–379)
RBC: 4.98 x10E6/uL (ref 3.77–5.28)
RDW: 15.5 % — AB (ref 12.3–15.4)
WBC: 6.1 10*3/uL (ref 3.4–10.8)

## 2016-12-30 LAB — TSH: TSH: 2.03 u[IU]/mL (ref 0.450–4.500)

## 2016-12-30 NOTE — Progress Notes (Signed)
*  PRELIMINARY RESULTS* Vascular Ultrasound Renal Artery Duplex has been completed.  Findings suggest 1-59% renal artery stenosis bilaterally.  12/30/2016 11:47 AM Maudry Mayhew, BS, RVT, RDCS, RDMS

## 2017-01-02 ENCOUNTER — Encounter: Payer: Self-pay | Admitting: Internal Medicine

## 2017-01-02 ENCOUNTER — Ambulatory Visit (INDEPENDENT_AMBULATORY_CARE_PROVIDER_SITE_OTHER): Payer: BLUE CROSS/BLUE SHIELD | Admitting: Internal Medicine

## 2017-01-02 ENCOUNTER — Ambulatory Visit (HOSPITAL_COMMUNITY): Payer: BLUE CROSS/BLUE SHIELD

## 2017-01-02 VITALS — BP 134/84 | HR 77 | Temp 98.2°F | Ht 67.5 in | Wt 269.0 lb

## 2017-01-02 DIAGNOSIS — G4459 Other complicated headache syndrome: Secondary | ICD-10-CM

## 2017-01-02 MED ORDER — SUMATRIPTAN SUCCINATE 25 MG PO TABS: 25.0000 mg | ORAL_TABLET | ORAL | 0 refills | Status: DC | PRN

## 2017-01-02 MED ORDER — PROMETHAZINE HCL 25 MG/ML IJ SOLN
25.0000 mg | Freq: Once | INTRAMUSCULAR | Status: AC
  Administered 2017-01-02: 25 mg via INTRAMUSCULAR

## 2017-01-02 MED ORDER — KETOROLAC TROMETHAMINE 60 MG/2ML IM SOLN
60.0000 mg | Freq: Once | INTRAMUSCULAR | Status: AC
  Administered 2017-01-02: 60 mg via INTRAMUSCULAR

## 2017-01-02 NOTE — Assessment & Plan Note (Signed)
Have been going on for months. Now occurring every day. Headaches located in the front of her head and feel like "someone is punching the inside of her head". No relief with ibuprofen. Has recently undergone workup for resistant hypertension, as it was thought that her headaches were related to uncontrolled hypertension. Workup has been negative so far. BP 134/84 today and patient still having headache. She does have a sleep study pending to rule out obstructive sleep apnea. She has undergone workup for pseudotumor cerebri and had a negative MRI and normal eye exam at the ophthalmologist's office. Patient has also been tried on propranolol for migraine prophylaxis and hypertension, but she was unable to tolerate this medication due to side effects. Today, she is endorsing new dizziness and occasional numbness of the left arm and leg. This may be a complex migraine. - Patient given Toradol 60 mg IM and Phenergan 25 mg IM in clinic today, which improved her headache - Blood pressure controlled in clinic today, so will start Imitrex 25 mg every 2 hours as needed for headaches for 2 doses - Will refer to neurology for further evaluation. Unsure if patient would benefit from MRV brain. - Could consider starting Topamax in the future - Patient will call me in a few days to let me know how the Imitrex is working. - Sleep study pending

## 2017-01-02 NOTE — Progress Notes (Signed)
[facility] Phone: [phone]  Subjective:  Summer Brewer is a 46 year old female presenting to same-day clinic with headache. The headaches have been going on for months. She has had a headache everyday for the last 2 weeks. The headaches feel like "punching inside her head". The headaches are located in the front of her head. The headaches do not radiate. She has been taking ibuprofen 400 mg 2-4 times per day. She also notes occasional mild numbness in her left arm and leg. She has also been having some dizziness, where she feels like "the room is spinning". No headaches that wake her up at night. No vomiting. No fevers, no chills. She does not think that she stops breathing at night. She does endorse morning headaches, as well as fatigue.  It was initially thought that her headaches were related to uncontrolled HTN. She is currently undergoing workup for resistant hypertension. She continues to have elevated blood pressures despite maximum doses of 3 antihypertensives. Lab work including CBC, BMP, renin, aldosterone, TSH, UA, and urine protein creatinine ratio have all been negative. She also had other renal artery duplex ultrasound to rule out renal artery stenosis. The ultrasound was normal. She has a sleep study pending to rule out obstructive sleep apnea.  She has also undergone an MRI brain because of previous concerns for pseudotumor cerebri. She is morbidly obese and is having visual changes. MRI brain was negative for findings consistent with pseudotumor cerebri. She was also referred to ophthalmology, and had a normal eye exam.  ROS: See HPI for pertinent positives and negatives  Past Medical History- uncontrolled hypertension, GERD, eczema, obesity  Family history reviewed for today's visit. No changes.  Social history- patient is a never smoker  Objective: BP 134/84   Pulse 77   Temp 98.2 F (36.8 C) (Oral)   Ht 5' 7.5" (1.715 m)   Wt 269 lb (122 kg)    SpO2 98%   BMI 41.51 kg/m  Gen: Tired-appearing, occasionally tearful HEENT: NCAT, EOMI, MMM, PERRLA Neck: FROM, supple, no thyromegaly CV: RRR, no murmur Resp: CTABL, no wheezes, normal work of breathing Msk: No edema, warm, normal tone, moves UE/LE spontaneously Neuro: Alert and oriented, CN 2-12 intact, 5/5 muscle strength in upper and lower extremities bilaterally, sensation intact to light touch throughout, reflexes normal and symmetric, normal finger-to-nose Skin: No rashes, no lesions Psych: Appropriate behavior  Assessment/Plan: Headaches:  Have been going on for months. Now occurring every day. Headaches located in the front of her head and feel like "someone is punching the inside of her head". No relief with ibuprofen. Has recently undergone workup for resistant hypertension, as it was thought that her headaches were related to uncontrolled hypertension. Workup has been negative so far. BP 134/84 today and patient still having headache. She does have a sleep study pending to rule out obstructive sleep apnea. She has undergone workup for pseudotumor cerebri and had a negative MRI and normal eye exam at the ophthalmologist's office. Patient has also been tried on propranolol for migraine prophylaxis and hypertension, but she was unable to tolerate this medication due to side effects. Today, she is endorsing new dizziness and occasional numbness of the left arm and leg. This may be a complex migraine. - Patient given Toradol 60 mg IM and Phenergan 25 mg IM in clinic today, which improved her headache - Blood pressure controlled in clinic today, so will start Imitrex 25 mg every 2 hours as needed for headaches for 2  doses - Will refer to neurology for further evaluation. Unsure if patient would benefit from MRV brain. - Could consider starting Topamax in the future - Patient will call me in a few days to let me know how the Imitrex is working. - Sleep study pending   Hyman Bible,  MD PGY-2

## 2017-01-02 NOTE — Patient Instructions (Signed)
We have given you a shot of Toradol and Phenergan today to help your headache. I have also prescribed a medication called Sumatriptan. You can take this as needed for migraines. You can take 1 tablet, then another tablet 2 hours later if your headache has not gotten better.  Please let me know how this is working for you.  For the dizziness, you may have vertigo. This can be related to headaches. Let's see how these medications work first. They may help your dizziness. If you continue to have dizziness, we can talk about starting a medication for the vertigo.  I will talk with our referral coordinator about the sleep study.  -Dr. Brett Albino

## 2017-01-06 ENCOUNTER — Telehealth: Payer: Self-pay | Admitting: *Deleted

## 2017-01-06 NOTE — Telephone Encounter (Signed)
Called patient and informed her that I spoke with Rafael Bihari for her sleep study but was advised that patient be the one to schedule this appointment.  She voiced understanding and will call them back. Jazmin Hartsell,CMA

## 2017-01-06 NOTE — Telephone Encounter (Signed)
-----   Message from Sela Hua, MD sent at 01/05/2017  7:03 PM EDT ----- I have been trying to get this patient scheduled for a sleep study. She has called [facility] multiple times, but can't get in touch with them. Is there anyway we could call to try to get her scheduled? Their phone number is [phone]. Thank you so much!

## 2017-01-14 ENCOUNTER — Encounter: Payer: Self-pay | Admitting: Neurology

## 2017-01-15 ENCOUNTER — Ambulatory Visit: Payer: BLUE CROSS/BLUE SHIELD | Admitting: Family Medicine

## 2017-01-16 ENCOUNTER — Ambulatory Visit: Payer: BLUE CROSS/BLUE SHIELD | Admitting: Family Medicine

## 2017-02-26 ENCOUNTER — Other Ambulatory Visit: Payer: Self-pay | Admitting: Internal Medicine

## 2017-03-13 ENCOUNTER — Ambulatory Visit: Payer: BLUE CROSS/BLUE SHIELD | Admitting: Neurology

## 2017-03-18 ENCOUNTER — Institutional Professional Consult (permissible substitution): Payer: BLUE CROSS/BLUE SHIELD | Admitting: Pulmonary Disease

## 2017-04-04 ENCOUNTER — Other Ambulatory Visit: Payer: Self-pay | Admitting: Internal Medicine

## 2017-04-06 ENCOUNTER — Other Ambulatory Visit: Payer: Self-pay | Admitting: Internal Medicine

## 2017-04-06 MED ORDER — AMLODIPINE BESYLATE 10 MG PO TABS: 10.0000 mg | ORAL_TABLET | Freq: Every day | ORAL | 0 refills | Status: DC

## 2017-06-03 ENCOUNTER — Other Ambulatory Visit: Payer: Self-pay | Admitting: Internal Medicine

## 2018-06-17 ENCOUNTER — Ambulatory Visit (INDEPENDENT_AMBULATORY_CARE_PROVIDER_SITE_OTHER): Payer: Self-pay | Admitting: Student in an Organized Health Care Education/Training Program

## 2018-06-17 ENCOUNTER — Other Ambulatory Visit: Payer: Self-pay

## 2018-06-17 VITALS — BP 152/98 | HR 98 | Temp 98.9°F | Ht 67.5 in | Wt 274.8 lb

## 2018-06-17 DIAGNOSIS — B86 Scabies: Secondary | ICD-10-CM

## 2018-06-17 DIAGNOSIS — I10 Essential (primary) hypertension: Secondary | ICD-10-CM

## 2018-06-17 DIAGNOSIS — F411 Generalized anxiety disorder: Secondary | ICD-10-CM

## 2018-06-17 MED ORDER — CITALOPRAM HYDROBROMIDE 20 MG PO TABS: 20.0000 mg | ORAL_TABLET | Freq: Every day | ORAL | 0 refills | Status: DC

## 2018-06-17 MED ORDER — AMLODIPINE BESYLATE 10 MG PO TABS: 10.0000 mg | ORAL_TABLET | Freq: Every day | ORAL | 0 refills | Status: DC

## 2018-06-17 MED ORDER — PERMETHRIN 5 % EX CREA: 1.0000 "application " | TOPICAL_CREAM | Freq: Once | CUTANEOUS | 0 refills | Status: AC

## 2018-06-17 NOTE — Patient Instructions (Addendum)
It was a pleasure seeing you today in our clinic.   Please schedule follow up to be seen in 2 weeks for blood pressure and anxiety check.  Our clinic's number is [phone]. Please call with questions or concerns about what we discussed today.  Be well, Dr. Burr Medico

## 2018-06-17 NOTE — Progress Notes (Signed)
CC: itchy rash  HPI: Summer Brewer is a 47 y.o. female with PMH significant for anxiety state, depressed mood, HTN who presents to [facility] today itchy rash.  Rash Patient reports that she has a history of eczema, however this feels different than her previous symptoms of eczema.  She has had multiple weeks of itching on her arms and wrists.  She has no contacts with similar symptoms.  No fevers.  No new exposures such as lotions, soaps, meds.  Out of medication Patient reports that she has recent life stressors with having to take care of her son who is ill.  She reports that she has not refilled any of her medications in about a year.  She was previously on 3 medications for blood pressure control, however she has not taken any of these.  Additionally she has been out of her Celexa.  Her blood pressure is elevated today in the office, however it is only 152/98.  She is not commending of chest pain, dyspnea, blurry vision, or headaches.  She is not checking her blood pressures at home.  Anxiety state/depressed mood She reports that she feels like her "nerves" are causing her itching and her symptoms.  She reports that she has a history of anxiety.  Gad 7 score is 21/21.  She does have notable life stressors with having to take care of her son who is ill.  Her PHQ 9 score is elevated at 20.  She does mark 2 for the question about thoughts that she would be better off dead, however she clarifies to state that she does not have any suicidal thoughts, she does not have any thoughts of hurting herself, she does not have any plan.  Her purpose for living is her family.  She feels like she was in a better place previously and she wishes that she could go back to a better place now.  Does not want to die.  Not interested in behavioral health services at this time.  GAD 7 : Generalized Anxiety Score 06/17/2018  Nervous, Anxious, on Edge 3  Control/stop worrying 3  Worry too much - different things 3    Trouble relaxing 3  Restless 3  Easily annoyed or irritable 3  Afraid - awful might happen 3  Total GAD 7 Score 21  Anxiety Difficulty Extremely difficult   Depression screen [facility] 2/9 06/17/2018 06/17/2018 01/02/2017 12/22/2016 06/03/2016  Decreased Interest 3 0 0 0 2  Down, Depressed, Hopeless 0 0 0 0 1  PHQ - 2 Score 3 0 0 0 3  Altered sleeping 3 - - - 3  Tired, decreased energy 3 - - - 3  Change in appetite 3 - - - 2  Feeling bad or failure about yourself  3 - - - 3  Trouble concentrating 3 - - - 3  Moving slowly or fidgety/restless 0 - - - 3  Suicidal thoughts 2 - - - 0  PHQ-9 Score 20 - - - 20  Difficult doing work/chores Extremely dIfficult - - - -   Review of Symptoms:  See HPI for ROS.   CC, SH/smoking status, and VS noted.  Objective: BP (!) 152/98   Pulse 98   Temp 98.9 F (37.2 C) (Oral)   Ht 5' 7.5" (1.715 m)   Wt 274 lb 12.8 oz (124.6 kg)   SpO2 97%   BMI 42.40 kg/m  GEN: NAD, alert, cooperative, and pleasant. RESPIRATORY: Comfortable work of breathing, speaks in full sentences  CV: Regular rate noted, distal extremities well perfused and warm without edema GI: Soft, nondistended SKIN: + Small 0.5 cm diameter circular flat lesions noted at the patient's wrist bilaterally and multiple lesions are also noted on her arms consistent with bug bites.  No excoriations appreciated.  No surrounding erythema or warmth.  No drainage. NEURO: II-XII grossly intact MSK: Moves 4 extremities equally PSYCH: AAOx3, appropriate affect  Assessment and plan:  1.  Rash -history and exam is most consistent with scabies.  Will treat with permethrin 5% lotion x1 and with an additional treatment in 7 days.   - Advised patient on how to clean her close and decontaminate items that are unable to go into the wash machine.   - Family members should also be treated.   - Patient is concerned that she may not be able to afford her medicines and so indigent fund was used in order to help her  and she was given information on the map program to help her get coverage for her medicines.  2.  Out of medicines/hypertension -patient was previously on lisinopril, HCTZ, and amlodipine for hypertension.  Her blood pressure is elevated at 152/98 in the office today after having been off of her antihypertensives for 1 year.  We will start slow with Norvasc 10 mg daily.  Asked the patient to return to clinic for an office visit in 2 weeks so that we can recheck her blood pressure and add back further medications if necessary.  She is agreeable to this plan.  No red flag symptoms for hypertension such as chest pain or dyspnea today.  3.  Anxiety state/depression -patient is not in acute danger to herself or others based on my assessment today.  Would have like to discuss her mood symptoms with her further, however there is not time in this visit because she was more concerned with her other problems.  We will restart her Celexa at her previous dose.  I want her to come back in 2 weeks to discuss mood further.  Return precautions were discussed.  She refuses [facility] consult at today's visit.  Everrett Coombe, MD,MS,  PGY3 06/17/2018 5:16 PM

## 2018-06-18 ENCOUNTER — Telehealth: Payer: Self-pay

## 2018-06-18 ENCOUNTER — Other Ambulatory Visit: Payer: Self-pay | Admitting: Student in an Organized Health Care Education/Training Program

## 2018-06-18 DIAGNOSIS — F411 Generalized anxiety disorder: Secondary | ICD-10-CM

## 2018-06-18 DIAGNOSIS — I10 Essential (primary) hypertension: Secondary | ICD-10-CM

## 2018-06-18 DIAGNOSIS — B86 Scabies: Secondary | ICD-10-CM

## 2018-06-18 MED ORDER — AMLODIPINE BESYLATE 10 MG PO TABS: 10.0000 mg | ORAL_TABLET | Freq: Every day | ORAL | 0 refills | Status: DC

## 2018-06-18 MED ORDER — CITALOPRAM HYDROBROMIDE 20 MG PO TABS: 20.0000 mg | ORAL_TABLET | Freq: Every day | ORAL | 0 refills | Status: DC

## 2018-06-18 MED ORDER — PERMETHRIN 5 % EX CREA: 1.0000 "application " | TOPICAL_CREAM | Freq: Once | CUTANEOUS | 0 refills | Status: AC

## 2018-06-18 NOTE — Telephone Encounter (Signed)
Pt calling to let us know that the Rx was sent to [facility], not [facility]. Please resend Rx. Also, pharmacy had no idea we were paying for the medication and has not idea that we do that for our patients. Pt would like [place] to make sure the pharmacy knows we are paying for the meds as she has no insurance or money. Pt would like to be called at [phone] when this has been fixed and the medication is ready to be picked up. Ottis Stain, CMA

## 2018-06-18 NOTE — Telephone Encounter (Signed)
I sent the prescription to the [facility] outpatient pharmacy and call to speak with them.  They reported that they will fill the prescription and understands that this is to be paid for by the [facility] indigent fund.  I also called the patient and informed her that her prescription can be picked up for no charge at the [facility] outpatient pharmacy.  She expresses understanding that she will month grace period  In order to get set up with the map program

## 2018-06-21 ENCOUNTER — Telehealth: Payer: Self-pay

## 2018-06-21 NOTE — Telephone Encounter (Signed)
Patient left message that she has used the cream that was called in for her and her itching is actually worsened.   Does she need a different medication or what is her next step?  [place], RN [facility] RN)

## 2018-06-22 NOTE — Telephone Encounter (Signed)
She should re-use the cream 7 days after her first use. She should be sure to clean all bedclothes, clothes, etc as we discussed in her OV.

## 2018-06-22 NOTE — Telephone Encounter (Signed)
Spoke with Pt. Pt states she needs more medication as she was told to "use the whole tube the first time". What can be done for the itch, pt states she has "scratched holes in her body." please advise. Pt told we would call her as soon as we have an answer. Ottis Stain, CMA

## 2018-06-23 ENCOUNTER — Other Ambulatory Visit: Payer: Self-pay | Admitting: Student in an Organized Health Care Education/Training Program

## 2018-06-23 DIAGNOSIS — B86 Scabies: Secondary | ICD-10-CM

## 2018-06-23 MED ORDER — PERMETHRIN 5 % EX CREA: 1.0000 "application " | TOPICAL_CREAM | Freq: Once | CUTANEOUS | 0 refills | Status: AC

## 2018-06-23 NOTE — Telephone Encounter (Signed)
Her symptoms sound a lot like scabies, however if her rash is significantly worse or changed from when I saw her, she can consider coming in to be evaluated again.   [facility] Unfortunately this time it will not be covered by the indigent fund.

## 2018-06-24 NOTE — Telephone Encounter (Signed)
Contacted Pt and she wanted to come in and be seen again, placed on Dr. Gwendlyn Deutscher schedule for tomorrow. Katharina Caper, April D, [place]

## 2018-06-25 ENCOUNTER — Ambulatory Visit: Payer: Self-pay | Admitting: Family Medicine

## 2018-12-13 ENCOUNTER — Telehealth: Payer: Self-pay

## 2018-12-13 NOTE — Telephone Encounter (Signed)
Pt called nurse line stating her eczema is "really bad" and she would like a refill on Triamcinolone Ointment. Pt has not had this refilled since 2017, informed pt she may need a telehealth visit. Will forward to PCP to advise.

## 2018-12-14 ENCOUNTER — Other Ambulatory Visit: Payer: Self-pay | Admitting: Family Medicine

## 2018-12-14 DIAGNOSIS — L309 Dermatitis, unspecified: Secondary | ICD-10-CM

## 2018-12-14 MED ORDER — TRIAMCINOLONE ACETONIDE 0.5 % EX OINT: 1.0000 "application " | TOPICAL_OINTMENT | Freq: Three times a day (TID) | CUTANEOUS | 1 refills | Status: DC

## 2019-09-06 ENCOUNTER — Telehealth (INDEPENDENT_AMBULATORY_CARE_PROVIDER_SITE_OTHER): Payer: Self-pay | Admitting: Family Medicine

## 2019-09-06 ENCOUNTER — Other Ambulatory Visit: Payer: Self-pay

## 2019-09-06 DIAGNOSIS — I10 Essential (primary) hypertension: Secondary | ICD-10-CM

## 2019-09-06 DIAGNOSIS — G4459 Other complicated headache syndrome: Secondary | ICD-10-CM

## 2019-09-06 MED ORDER — AMLODIPINE BESYLATE 10 MG PO TABS: 10.0000 mg | ORAL_TABLET | Freq: Every day | ORAL | 0 refills | Status: DC

## 2019-09-06 MED ORDER — SUMATRIPTAN SUCCINATE 100 MG PO TABS: 100.0000 mg | ORAL_TABLET | ORAL | 0 refills | Status: DC | PRN

## 2019-09-06 MED ORDER — SUMATRIPTAN SUCCINATE 25 MG PO TABS: 25.0000 mg | ORAL_TABLET | ORAL | 0 refills | Status: DC | PRN

## 2019-09-06 NOTE — Assessment & Plan Note (Addendum)
Off-and-on for the past several years.  Suspect chronic migraines with possible tension/hypertensive headache component she has not taken any antihypertensives since 2018, but states her blood pressure has been reasonable during this time.  Previous negative work-up for pseudotumor cerebri including a normal brain MRI and ophthalmology exam.  Also considering and need to rule out concurrent OSA given her history of occipital morning headaches and hypertension, will get her set up for a sleep study in the new year.  Sent in Imitrex for abortive therapy.  Recommended follow-up with her primary care provider in the next 2 weeks (especially as her insurance takes effect on 09/09/2019) or sooner if worsening/development of neurological component.  -Imitrex 100 mg sent in, 10 tablets, to call if she has difficulties with cost -Ambulatory referral to sleep studies placed -Follow-up in 2 weeks or sooner if needed

## 2019-09-06 NOTE — Assessment & Plan Note (Addendum)
Has not been on any of her previous antihypertensive regimen since 2018 due to lack of insurance, she had difficulties with resistant hypertension previously.  She has been checking her BP intermittently, states SBP is normally around 150s.  She is interested in restarting her medications today, sent in her amlodipine 10 mg however recommended following up with her PCP in 2 weeks as discussed above to check her blood pressure and labs prior to starting her additional medications.

## 2019-09-06 NOTE — Progress Notes (Signed)
[place] Telemedicine Visit  Patient consented to have virtual visit. Method of visit: Video  Encounter participants: Patient: Summer Brewer - located in her car.  Provider: Patriciaann Clan - located at Home  Others (if applicable): none   Chief Complaint: headaches   HPI: Summer Brewer is a 48 year old female with hypertension, GERD, eczema, and chronic headaches presenting discuss the following:  Headaches: On and off for the past several years.  Previously started a work-up to rule out pseudotumor cerebri and OSA with a negative brain MRI/normal ophthalmology evaluation.  She was referred to neurology, however was not able to make that appointment after her son had an accident hospitalized for >150 days.  She also never had her sleep study completed.  She states her headache quality will change, sometimes unilateral or bilateral in the front, sometimes occipital.  Can have at any point in the day, however will wake up several mornings with a headache.  Associated with nausea and photophobia.  She currently has a headache right now which is bilateral, frontal, and constantly aching in nature.  She often times feels better in a dark quiet room.  Sometimes she will see flashing lights prior to headache and sometimes will have her head feel "numb. "Used to have blurry vision with her headaches 2 years ago, does not have that now.  No other neurological symptomatology.  Having headaches about 1-2 times weekly as of now, however sometimes will go away for 6 months at a time.  She has tried Excedrin migraine with little relief.  Does not drink excessive caffeine.  Snores at night.  Imitrex in the past seemed to be helpful.    Hypertension: States she is out of all of her medications, has only taken a cystoscopy as an 26 when she lost her insurance.  She is going to have [employer] starting January 1.  She would like to restart blood pressure medications.   She often will take her BP at home, with SBP 150 today.   ROS: per HPI  Pertinent PMHx: See above.  Exam:  Respiratory: Unlabored breathing, speaking in full sentences Neuro: Alert and oriented x3.  Speech appropriate.  EOMI.  Smile symmetrical.  Sensation to light touch intact throughout upper and lower extremity.  Normal gait.  No pronator drift.  + Leg pain  Assessment/Plan:  Other complicated headache syndrome Off-and-on for the past several years.  Suspect chronic migraines with possible tension/hypertensive headache component she has not taken any antihypertensives since 2018, but states her blood pressure has been reasonable during this time.  Previous negative work-up for pseudotumor cerebri including a normal brain MRI and ophthalmology exam.  Also considering and need to rule out concurrent OSA given her history of occipital morning headaches and hypertension, will get her set up for a sleep study in the new year.  Sent in Imitrex for abortive therapy.  Recommended follow-up with her primary care provider in the next 2 weeks (especially as her insurance takes effect on 09/09/2019) or sooner if worsening/development of neurological component.  -Imitrex 100 mg sent in, 10 tablets, to call if she has difficulties with cost -Ambulatory referral to sleep studies placed -Follow-up in 2 weeks or sooner if needed  HYPERTENSION, BENIGN SYSTEMIC Has not been on any of her previous antihypertensive regimen since 2018 due to lack of insurance, she had difficulties with resistant hypertension previously.  She has been checking her BP intermittently, states SBP is  normally around 150s.  She is interested in restarting her medications today, sent in her amlodipine 10 mg however recommended following up with her PCP in 2 weeks as discussed above to check her blood pressure and labs prior to starting her additional medications.    Time spent during visit with patient: 18 minutes  Patriciaann Clan,  DO

## 2019-09-13 ENCOUNTER — Ambulatory Visit (INDEPENDENT_AMBULATORY_CARE_PROVIDER_SITE_OTHER): Payer: Self-pay | Admitting: Family Medicine

## 2019-09-13 ENCOUNTER — Encounter: Payer: Self-pay | Admitting: Family Medicine

## 2019-09-13 ENCOUNTER — Other Ambulatory Visit: Payer: Self-pay

## 2019-09-13 DIAGNOSIS — G43109 Migraine with aura, not intractable, without status migrainosus: Secondary | ICD-10-CM

## 2019-09-13 DIAGNOSIS — G43909 Migraine, unspecified, not intractable, without status migrainosus: Secondary | ICD-10-CM | POA: Insufficient documentation

## 2019-09-13 MED ORDER — ELETRIPTAN HYDROBROMIDE 20 MG PO TABS: 20.0000 mg | ORAL_TABLET | ORAL | 0 refills | Status: DC | PRN

## 2019-09-13 MED ORDER — METOCLOPRAMIDE HCL 10 MG PO TABS: 10.0000 mg | ORAL_TABLET | Freq: Four times a day (QID) | ORAL | 2 refills | Status: DC

## 2019-09-13 MED ORDER — PROPRANOLOL HCL ER 60 MG PO CP24: 60.0000 mg | ORAL_CAPSULE | Freq: Every day | ORAL | 2 refills | Status: DC

## 2019-09-13 NOTE — Progress Notes (Signed)
Date of Visit: 09/13/2019   HPI:  Summer Brewer presents today for recent headaches  Headaches: Patient has a past medical history of transient headaches for several years and was recently seen in the clinic for the same issue. She describes similar symptoms today. For around 2 weeks she has had headaches for 3-4 days of the week. She describes the pain as aching and pounding on the left side of her head or in a band like distribution. She's notices that her headaches linger overnight when they present. During an episode, she describes additional pain in the presence of bright lights. In addition she notes visual changes including: auras (described as sparkles), double vision, cloudiness. On review of systems, pertinent positives include nausea, snoring at night time and numbness around the back of her head (in the C-2 distribution). Her pain is not alleviated with the use of medications, including: ibuprofen, tylenol, goody powder, Excedrin or sumatriptan.   ROS: See HPI.  [place]: Hypertension, obesity, anemia   PHYSICAL EXAM: BP (!) 154/90   Pulse 81   Wt 275 lb (124.7 kg)   SpO2 99%   BMI 42.44 kg/m  Gen: well appearing, fatigued, conversing appropriately HEENT: Moist mucus membranes, PERRL  Heart: RRR, normal S1, S2, no murmur, rubs, gallops or carotic bruits Lungs: Clear to auscultation bilaterally, normal work of breathing  Neuro: A/O x 3, CN II - XII intact  ASSESSMENT/PLAN:  Headache/MIgraine: Presentation of headaches appears to be due to migraines as they are persistent, pounding, and are associated with photophobia and visual disturbances. These headaches have thus for been refractory to abortive treatments that were effective in the past. In addition, there could be a component of headaches related to hypertension (154/90) or obstructive sleep apnea (patient snores at night and has BMI in obese range). - begin prophylactic therapy with daily propranolol 60 mg ER - continue abortive  therapy with eletriptan 20 mg every 2 hours as needed.  Consider increase to 40 mg if needed. - begin metoclopramide for nausea PRN -Follow-up sleep study for OSA  No problem-specific Assessment & Plan notes found for this encounter.  FOLLOW UP: Follow up in 1 month  Maudry Diego, [place] Upper-Level Resident Addendum   I have independently interviewed and examined the patient. I have discussed the above with the original author and agree with their documentation. My edits for correction/addition/clarification are in blue. Please see also any attending notes.    Matilde Haymaker MD PGY-2, [facility] 09/13/2019 6:22 PM

## 2019-09-13 NOTE — Assessment & Plan Note (Signed)
Presentation of headaches appears to be due to migraines as they are persistent, pounding, and are associated with photophobia and visual disturbances. These headaches have thus for been refractory to abortive treatments that were effective in the past. In addition, there could be a component of headaches related to hypertension (154/90) or obstructive sleep apnea (patient snores at night and has BMI in obese range). - begin prophylactic therapy with daily propranolol 60 mg ER - continue abortive therapy with eletriptan 20 mg every 2 hours as needed.  Consider increase to 40 mg if needed. - begin metoclopramide for nausea PRN -Follow-up sleep study for OSA

## 2019-09-20 ENCOUNTER — Institutional Professional Consult (permissible substitution): Payer: Self-pay | Admitting: Neurology

## 2019-09-20 ENCOUNTER — Encounter: Payer: Self-pay | Admitting: Family Medicine

## 2019-09-20 ENCOUNTER — Encounter: Payer: Self-pay | Admitting: Neurology

## 2019-09-20 ENCOUNTER — Telehealth: Payer: Self-pay

## 2019-09-20 ENCOUNTER — Other Ambulatory Visit: Payer: Self-pay

## 2019-09-20 ENCOUNTER — Ambulatory Visit (INDEPENDENT_AMBULATORY_CARE_PROVIDER_SITE_OTHER): Payer: Self-pay | Admitting: Family Medicine

## 2019-09-20 ENCOUNTER — Other Ambulatory Visit (HOSPITAL_COMMUNITY)
Admission: RE | Admit: 2019-09-20 | Discharge: 2019-09-20 | Disposition: A | Discharge status: Home / Self Care | Payer: Self-pay | Source: Ambulatory Visit | Attending: Family Medicine | Admitting: Family Medicine

## 2019-09-20 VITALS — BP 146/92 | HR 84 | Wt 275.4 lb

## 2019-09-20 DIAGNOSIS — N921 Excessive and frequent menstruation with irregular cycle: Secondary | ICD-10-CM

## 2019-09-20 DIAGNOSIS — Z124 Encounter for screening for malignant neoplasm of cervix: Secondary | ICD-10-CM | POA: Insufficient documentation

## 2019-09-20 DIAGNOSIS — N939 Abnormal uterine and vaginal bleeding, unspecified: Secondary | ICD-10-CM

## 2019-09-20 DIAGNOSIS — Z113 Encounter for screening for infections with a predominantly sexual mode of transmission: Secondary | ICD-10-CM

## 2019-09-20 NOTE — Progress Notes (Signed)
  Subjective:   Patient ID: Summer Brewer    DOB: 20-May-1971, 49 y.o. female   MRN: [id number]  Summer Brewer is a 49 y.o. female with a history of HTN, migraine, GERD, eczema, obesity, iron deficiency anemia, AUB, depression/anxiety here for   Need for pap smear - G2P2 - Menses: irregular, has IUD but still with some breakthrough bleeding. - Contraception: IUD placed 03/2013, BTL 2002 - Cancer screening: normal, last 2007 - no abnl vaginal discharge, itching, burning.  - wears panty liner with breakthrough bleeding, sometimes requires tampons. Bleeding typically occurs a couple times per week.  - has new sexual partner, wants to be checked for STIs.  Healthcare Maintenance - Vaccines: flu, tdap - Colonoscopy: n/a - Mammogram: n/a - Pap Smear: due   Review of Systems:  Per HPI.  Medications and smoking status reviewed.  Objective:   BP (!) 146/92   Pulse 84   Wt 275 lb 6.4 oz (124.9 kg)   BMI 42.50 kg/m  Vitals and nursing note reviewed.  General: obese female, in no acute distress with non-toxic appearance GYN:  External genitalia within normal limits.  Vaginal mucosa pink, moist, normal rugae.  Nonfriable cervix without lesions, no discharge or bleeding noted on speculum exam.  IUD strings visible. Bimanual exam revealed normal, nongravid uterus.  No cervical motion tenderness. No adnexal masses bilaterally.    Assessment & Plan:   Need for pap smear Obtained today. Also obtained GC/CT, trich, HIV, RPR testing per patient request.   Abnormal uterine bleeding (AUB) Refractory to IUD though is overdue for replacement. Discussed options with patient and given she did not tolerate IUD insertion previously, is interested in ablation or hysterectomy, will place referral to GYN. Will defer IUD replacement for now until decision made. In the meantime, recommended scheduled NSAID use during bleeding. Will obtain CBC to assess for anemia given prior h/o iron deficiency and  breakthrough bleeding.  Healthcare Maintenance Declined flu and tdap vaccines.   Orders Placed This Encounter  Procedures  . HIV antibody (with reflex)  . RPR  . CBC  . Ambulatory referral to Gynecology    Referral Priority:   Routine    Referral Type:   Consultation    Referral Reason:   Specialty Services Required    Requested Specialty:   Gynecology    Number of Visits Requested:   1   No orders of the defined types were placed in this encounter.   Rory Percy, DO PGY-3, [facility] 09/20/2019 6:45 PM

## 2019-09-20 NOTE — Patient Instructions (Addendum)
It was great to see you!  Our plans for today:  - We updated your pap smear today and obtained STI testing. We also checked your blood counts given your history of bleeding. We will call you or send a letter with these results. - Someone from the gynecology office will call you to set up an appointment for a hysterectomy consultation.    Take care and seek immediate care sooner if you develop any concerns.   Dr. [facility]

## 2019-09-20 NOTE — Assessment & Plan Note (Addendum)
Refractory to IUD though is overdue for replacement. Discussed options with patient and given she did not tolerate IUD insertion previously, is interested in ablation or hysterectomy, will place referral to GYN. Will defer IUD replacement for now until decision made. In the meantime, recommended scheduled NSAID use during bleeding. Will obtain CBC to assess for anemia given prior h/o iron deficiency and breakthrough bleeding.

## 2019-09-20 NOTE — Telephone Encounter (Signed)
Patient no showed 09/20/2019 appointment with Dr. Rexene Alberts.

## 2019-09-21 ENCOUNTER — Encounter: Payer: Self-pay | Admitting: Family Medicine

## 2019-09-21 LAB — CBC
Hematocrit: 37.5 % (ref 34.0–46.6)
Hemoglobin: 12.5 g/dL (ref 11.1–15.9)
MCH: 26.8 pg (ref 26.6–33.0)
MCHC: 33.3 g/dL (ref 31.5–35.7)
MCV: 81 fL (ref 79–97)
Platelets: 204 10*3/uL (ref 150–450)
RBC: 4.66 x10E6/uL (ref 3.77–5.28)
RDW: 14.2 % (ref 11.7–15.4)
WBC: 7.2 10*3/uL (ref 3.4–10.8)

## 2019-09-21 LAB — HIV ANTIBODY (ROUTINE TESTING W REFLEX): HIV Screen 4th Generation wRfx: NONREACTIVE

## 2019-09-21 LAB — RPR: RPR Ser Ql: NONREACTIVE

## 2019-09-21 NOTE — Telephone Encounter (Signed)
Pt called and informed per Dr. Ky Barban that blood work was within normal ranges. Informed patient that we would notify her once her pap and STD screening results came back.   Talbot Grumbling, RN

## 2019-09-23 ENCOUNTER — Encounter: Payer: Self-pay | Admitting: Family Medicine

## 2019-09-23 LAB — CYTOLOGY - PAP
Chlamydia: NEGATIVE
Comment: NEGATIVE
Comment: NEGATIVE
Comment: NEGATIVE
Comment: NORMAL
High risk HPV: NEGATIVE
Neisseria Gonorrhea: NEGATIVE
Trichomonas: NEGATIVE

## 2019-09-27 ENCOUNTER — Telehealth: Payer: Self-pay | Admitting: Family Medicine

## 2019-09-27 DIAGNOSIS — G4459 Other complicated headache syndrome: Secondary | ICD-10-CM

## 2019-09-27 NOTE — Telephone Encounter (Signed)
Pharmacy is requesting instructions on patient's sumatriptan.  Current sig says "please see attached for detailed directions", but there is nothing attached to the script.  Will forward to MD to please add the directions to the script and resend.  Shahan Starks,CMA

## 2019-09-28 ENCOUNTER — Other Ambulatory Visit: Payer: Self-pay | Admitting: Family Medicine

## 2019-09-28 DIAGNOSIS — G4459 Other complicated headache syndrome: Secondary | ICD-10-CM

## 2019-09-28 DIAGNOSIS — I10 Essential (primary) hypertension: Secondary | ICD-10-CM

## 2019-09-28 MED ORDER — SUMATRIPTAN SUCCINATE 100 MG PO TABS: ORAL_TABLET | ORAL | 0 refills | Status: DC

## 2019-10-09 ENCOUNTER — Encounter: Payer: Self-pay | Admitting: Family Medicine

## 2019-10-10 MED ORDER — MEDROXYPROGESTERONE ACETATE 5 MG PO TABS: 5.0000 mg | ORAL_TABLET | Freq: Every day | ORAL | 0 refills | Status: DC

## 2019-10-10 NOTE — Telephone Encounter (Signed)
Summer Brewer was called.  She reported that she has been taking NSAIDs since her last visit with [facility] without any improvement to her vaginal bleeding.  She currently has an appointment set up with gynecology in one month.  She wants to know if there is anything that will help with bleeding in the meantime.  She is very frustrated with the constant bleeding and understands that a course of provera is not the solution to her bleeding but may provide brief symptom relief.

## 2019-11-08 ENCOUNTER — Ambulatory Visit (INDEPENDENT_AMBULATORY_CARE_PROVIDER_SITE_OTHER): Payer: BLUE CROSS/BLUE SHIELD | Admitting: Obstetrics and Gynecology

## 2019-11-08 ENCOUNTER — Other Ambulatory Visit: Payer: Self-pay | Admitting: Family Medicine

## 2019-11-08 ENCOUNTER — Encounter: Payer: Self-pay | Admitting: Obstetrics and Gynecology

## 2019-11-08 ENCOUNTER — Other Ambulatory Visit: Payer: Self-pay

## 2019-11-08 VITALS — BP 181/115 | HR 84 | Wt 277.5 lb

## 2019-11-08 DIAGNOSIS — R030 Elevated blood-pressure reading, without diagnosis of hypertension: Secondary | ICD-10-CM

## 2019-11-08 DIAGNOSIS — N939 Abnormal uterine and vaginal bleeding, unspecified: Secondary | ICD-10-CM | POA: Diagnosis not present

## 2019-11-08 DIAGNOSIS — G4459 Other complicated headache syndrome: Secondary | ICD-10-CM

## 2019-11-08 DIAGNOSIS — Z23 Encounter for immunization: Secondary | ICD-10-CM

## 2019-11-08 LAB — POCT PREGNANCY, URINE: Preg Test, Ur: NEGATIVE

## 2019-11-08 MED ORDER — SLYND 4 MG PO TABS: 1.0000 | ORAL_TABLET | Freq: Every day | ORAL | 4 refills | Status: DC
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
## 2019-11-08 NOTE — Patient Instructions (Signed)
Endometrial Ablation  Endometrial ablation is a procedure that destroys the thin inner layer of the lining of the uterus (endometrium). This procedure may be done:  To stop heavy periods.  To stop bleeding that is causing anemia.  To control irregular bleeding.  To treat bleeding caused by small tumors (fibroids) in the endometrium.  This procedure is often an alternative to major surgery, such as removal of the uterus and cervix (hysterectomy). As a result of this procedure:  You may not be able to have children. However, if you are premenopausal (you have not gone through menopause):  You may still have a small chance of getting pregnant.  You will need to use a reliable method of birth control after the procedure to prevent pregnancy.  You may stop having a menstrual period, or you may have only a small amount of bleeding during your period. Menstruation may return several years after the procedure.  Tell a health care provider about:  Any allergies you have.  All medicines you are taking, including vitamins, herbs, eye drops, creams, and over-the-counter medicines.  Any problems you or family members have had with the use of anesthetic medicines.  Any blood disorders you have.  Any surgeries you have had.  Any medical conditions you have.  What are the risks?  Generally, this is a safe procedure. However, problems may occur, including:  A hole (perforation) in the uterus or bowel.  Infection of the uterus, bladder, or vagina.  Bleeding.  Damage to other structures or organs.  An air bubble in the lung (air embolus).  Problems with pregnancy after the procedure.  Failure of the procedure.  Decreased ability to diagnose cancer in the endometrium.  What happens before the procedure?  You will have tests of your endometrium to make sure there are no pre-cancerous cells or cancer cells present.  You may have an ultrasound of the uterus.  You may be given medicines to thin the endometrium.  Ask your health care  provider about:  Changing or stopping your regular medicines. This is especially important if you take diabetes medicines or blood thinners.  Taking medicines such as aspirin and ibuprofen. These medicines can thin your blood. Do not take these medicines before your procedure if your doctor tells you not to.  Plan to have someone take you home from the hospital or clinic.  What happens during the procedure?    You will lie on an exam table with your feet and legs supported as in a pelvic exam.  To lower your risk of infection:  Your health care team will wash or sanitize their hands and put on germ-free (sterile) gloves.  Your genital area will be washed with soap.  An IV tube will be inserted into one of your veins.  You will be given a medicine to help you relax (sedative).  A surgical instrument with a light and camera (resectoscope) will be inserted into your vagina and moved into your uterus. This allows your surgeon to see inside your uterus.  Endometrial tissue will be removed using one of the following methods:  Radiofrequency. This method uses a radiofrequency-alternating electric current to remove the endometrium.  Cryotherapy. This method uses extreme cold to freeze the endometrium.  Heated-free liquid. This method uses a heated saltwater (saline) solution to remove the endometrium.  Microwave. This method uses high-energy microwaves to heat up the endometrium and remove it.  Thermal balloon. This method involves inserting a catheter with a balloon tip into   the uterus. The balloon tip is filled with heated fluid to remove the endometrium.  The procedure may vary among health care providers and hospitals.  What happens after the procedure?  Your blood pressure, heart rate, breathing rate, and blood oxygen level will be monitored until the medicines you were given have worn off.  As tissue healing occurs, you may notice vaginal bleeding for 4-6 weeks after the procedure. You may also  experience:  Cramps.  Thin, watery vaginal discharge that is light pink or brown in color.  A need to urinate more frequently than usual.  Nausea.  Do not drive for 24 hours if you were given a sedative.  Do not have sex or insert anything into your vagina until your health care provider approves.  Summary  Endometrial ablation is done to treat the many causes of heavy menstrual bleeding.  The procedure may be done only after medications have been tried to control the bleeding.  Plan to have someone take you home from the hospital or clinic.  This information is not intended to replace advice given to you by your health care provider. Make sure you discuss any questions you have with your health care provider.  Document Revised: 02/09/2018 Document Reviewed: 09/11/2016  Elsevier Patient Education  2020 [employer]

## 2019-11-09 ENCOUNTER — Other Ambulatory Visit: Payer: Self-pay | Admitting: Family Medicine

## 2019-11-09 DIAGNOSIS — G4459 Other complicated headache syndrome: Secondary | ICD-10-CM

## 2019-12-03 ENCOUNTER — Other Ambulatory Visit: Payer: Self-pay | Admitting: Family Medicine

## 2019-12-07 ENCOUNTER — Other Ambulatory Visit: Payer: Self-pay | Admitting: Family Medicine

## 2019-12-25 ENCOUNTER — Other Ambulatory Visit: Payer: Self-pay | Admitting: Family Medicine

## 2019-12-25 DIAGNOSIS — I10 Essential (primary) hypertension: Secondary | ICD-10-CM

## 2020-01-31 ENCOUNTER — Other Ambulatory Visit: Payer: Self-pay | Admitting: Family Medicine

## 2020-01-31 MED ORDER — ELETRIPTAN HYDROBROMIDE 20 MG PO TABS: 20.0000 mg | ORAL_TABLET | ORAL | 0 refills | Status: DC | PRN

## 2020-02-24 ENCOUNTER — Other Ambulatory Visit: Payer: Self-pay | Admitting: Family Medicine

## 2020-02-24 DIAGNOSIS — G4459 Other complicated headache syndrome: Secondary | ICD-10-CM

## 2020-03-04 ENCOUNTER — Other Ambulatory Visit: Payer: Self-pay | Admitting: Family Medicine

## 2020-04-02 ENCOUNTER — Other Ambulatory Visit: Payer: Self-pay | Admitting: Family Medicine

## 2020-04-02 MED ORDER — ELETRIPTAN HYDROBROMIDE 20 MG PO TABS: 20.0000 mg | ORAL_TABLET | ORAL | 0 refills | Status: DC | PRN

## 2020-04-22 ENCOUNTER — Other Ambulatory Visit: Payer: Self-pay

## 2020-04-22 ENCOUNTER — Encounter (HOSPITAL_BASED_OUTPATIENT_CLINIC_OR_DEPARTMENT_OTHER): Payer: Self-pay

## 2020-04-22 DIAGNOSIS — Z91040 Latex allergy status: Secondary | ICD-10-CM | POA: Insufficient documentation

## 2020-04-22 DIAGNOSIS — Z79899 Other long term (current) drug therapy: Secondary | ICD-10-CM | POA: Insufficient documentation

## 2020-04-22 DIAGNOSIS — I10 Essential (primary) hypertension: Secondary | ICD-10-CM | POA: Insufficient documentation

## 2020-04-22 DIAGNOSIS — R0602 Shortness of breath: Secondary | ICD-10-CM | POA: Insufficient documentation

## 2020-04-22 DIAGNOSIS — M79605 Pain in left leg: Secondary | ICD-10-CM | POA: Diagnosis present

## 2020-04-22 NOTE — ED Triage Notes (Signed)
Pt c/o pain and swelling that started yesterday. Pt concerned for DVT as she has significant family hx of same.

## 2020-04-23 ENCOUNTER — Emergency Department (HOSPITAL_BASED_OUTPATIENT_CLINIC_OR_DEPARTMENT_OTHER)
Admission: EM | Admit: 2020-04-23 | Discharge: 2020-04-23 | Disposition: A | Discharge status: Home / Self Care | Payer: BLUE CROSS/BLUE SHIELD | Attending: Emergency Medicine | Admitting: Emergency Medicine

## 2020-04-23 ENCOUNTER — Emergency Department (HOSPITAL_BASED_OUTPATIENT_CLINIC_OR_DEPARTMENT_OTHER)
Admit: 2020-04-23 | Discharge: 2020-04-23 | Disposition: A | Discharge status: Home / Self Care | Payer: BLUE CROSS/BLUE SHIELD | Attending: Emergency Medicine | Admitting: Emergency Medicine

## 2020-04-23 DIAGNOSIS — M79605 Pain in left leg: Secondary | ICD-10-CM

## 2020-04-23 HISTORY — DX: Migraine, unspecified, not intractable, without status migrainosus: G43.909

## 2020-04-23 MED ORDER — ENOXAPARIN SODIUM 120 MG/0.8ML ~~LOC~~ SOLN
1.0000 mg/kg | Freq: Once | SUBCUTANEOUS | Status: AC
  Administered 2020-04-23: 105 mg via SUBCUTANEOUS
  Filled 2020-04-23: qty 0.8

## 2020-04-23 MED ORDER — HYDROCODONE-ACETAMINOPHEN 5-325 MG PO TABS
1.0000 | ORAL_TABLET | Freq: Once | ORAL | Status: AC
  Administered 2020-04-23: 1 via ORAL
  Filled 2020-04-23: qty 1

## 2020-04-23 NOTE — ED Notes (Signed)
Pt is aware that she will return later today for an u/s at 1:30pm. Pt voiced understanding of instructions.

## 2020-04-23 NOTE — ED Provider Notes (Signed)
[facility] EMERGENCY DEPARTMENT Provider Note   CSN: [id number] Arrival date & time: 04/22/20  2106     History Chief Complaint  Patient presents with  . Leg Pain    Summer Brewer is a 49 y.o. female.  HPI     This a 49 year old female with a history of hypertension who presents with left leg pain.  Patient reports that she was walking yesterday when she had acute onset of left calf pain.  She states that it felt like a really bad cramp.  However, pain continued.  It is worse with ambulation.  She has put on some tape and taken over-the-counter medication with minimal relief.  She does report that she fell several days ago but does not believe she injured that leg.  Family history of DVT although no personal history.  She had some slight shortness of breath yesterday.  No chest pain.  Currently she rates her pain 8 out of 10.  Past Medical History:  Diagnosis Date  . Back pain   . Eczema   . GERD (gastroesophageal reflux disease)   . Hypertension   . Migraines   . Umbilical hernia     Patient Active Problem List   Diagnosis Date Noted  . Abnormal uterine bleeding (AUB) 09/20/2019  . Migraine 09/13/2019  . Other complicated headache syndrome 01/02/2017  . Blurred vision, bilateral 11/24/2016  . Depressed mood 06/03/2016  . Anxiety state 12/22/2014  . Eczema 01/28/2013  . Menorrhagia with regular cycle 07/19/2012  . GERD (gastroesophageal reflux disease) 01/07/2011  . BACK PAIN, LUMBAR 12/26/2008  . OBESITY, NOS 11/05/2006  . ANEMIA, IRON DEFICIENCY, UNSPEC. 11/05/2006  . HYPERTENSION, BENIGN SYSTEMIC 11/05/2006    Past Surgical History:  Procedure Laterality Date  . CESAREAN SECTION    . INSERTION OF MESH  08/09/2015   Procedure: INSERTION OF MESH;  Surgeon: Donnie Mesa, MD;  Location: [facility];  Service: General;;  . NASAL POLYP EXCISION Bilateral 2016   pt states she had polyps removed on both sides  . TUBAL LIGATION    . UMBILICAL HERNIA REPAIR   08/09/2015   Procedure: UMBILICAL HERNIA REPAIR;  Surgeon: Donnie Mesa, MD;  Location: [facility];  Service: General;;     OB History    Gravida  2   Para  2   Term  1   Preterm  1   AB      Living  2     SAB      TAB      Ectopic      Multiple      Live Births  2           Family History  Problem Relation Age of Onset  . Hypertension Mother   . Deep vein thrombosis Mother   . Hypertension Father   . Cancer - Prostate Father   . Hypertension Other   . Deep vein thrombosis Other   . Leukemia Other     Social History   Tobacco Use  . Smoking status: Never Smoker  . Smokeless tobacco: Never Used  Vaping Use  . Vaping Use: Never used  Substance Use Topics  . Alcohol use: No  . Drug use: No    Home Medications Prior to Admission medications   Medication Sig Start Date End Date Taking? Authorizing Provider  amLODipine (NORVASC) 10 MG tablet TAKE 1 TABLET BY MOUTH EVERY DAY 12/26/19   Matilde Haymaker, MD  citalopram (CELEXA) 20 MG tablet  Take 1 tablet (20 mg total) by mouth daily. 06/18/18   Everrett Coombe, MD  doxepin (SINEQUAN) 25 MG capsule TAKE 1 CAPSULE (25 MG TOTAL) BY MOUTH AT BEDTIME. Patient not taking: Reported on 11/08/2019 12/29/16   Mayo, Pete Pelt, MD  Drospirenone (SLYND) 4 MG TABS Take 1 tablet by mouth daily. 11/08/19   Constant, Peggy, MD  eletriptan (RELPAX) 20 MG tablet Take 1 tablet (20 mg total) by mouth as needed for migraine or headache. May repeat in 2 hours if headache persists or recurs. 04/02/20   Matilde Haymaker, MD  fluticasone (FLONASE) 50 MCG/ACT nasal spray Place 2 sprays into both nostrils daily. 09/05/15   Mariel Aloe, MD  hydrochlorothiazide (HYDRODIURIL) 25 MG tablet Take 1 tablet (25 mg total) by mouth 2 (two) times daily. 12/06/15   Mayo, Pete Pelt, MD  medroxyPROGESTERone (PROVERA) 5 MG tablet Take 1 tablet (5 mg total) by mouth daily. Patient not taking: Reported on 11/08/2019 10/10/19 10/09/20  Matilde Haymaker, MD  metoCLOPramide (REGLAN)  10 MG tablet Take 1 tablet (10 mg total) by mouth 4 (four) times daily. Patient not taking: Reported on 11/08/2019 09/13/19   Matilde Haymaker, MD  propranolol ER (INDERAL LA) 60 MG 24 hr capsule TAKE 1 CAPSULE BY MOUTH EVERY DAY 03/05/20   Matilde Haymaker, MD  SUMAtriptan (IMITREX) 100 MG tablet TAKE ONE PILL AT ONSET OF HEADACHE. TAKE ADDITIONAL PILL IF HEADACHE PERSISTS FOR MORE THAN 2 HOURS. DO NOT EXCEED 200 MG IN 24 HOURS. 02/24/20   Matilde Haymaker, MD    Allergies    Latex  Review of Systems   Review of Systems  Constitutional: Negative for fever.  Respiratory: Positive for shortness of breath. Negative for cough.   Cardiovascular: Negative for chest pain and leg swelling.  Gastrointestinal: Negative for abdominal pain.  Musculoskeletal:       Left leg pain  Skin: Negative for color change.  All other systems reviewed and are negative.   Physical Exam Updated Vital Signs BP (!) 179/122 (BP Location: Right Arm)   Pulse 74   Temp 98.1 F (36.7 C) (Oral)   Resp 18   Ht 1.702 m (5\' 7" )   Wt 106.1 kg   SpO2 100%   BMI 36.65 kg/m   Physical Exam Vitals and nursing note reviewed.  Constitutional:      Appearance: She is well-developed. She is obese. She is not ill-appearing.  HENT:     Head: Normocephalic and atraumatic.     Nose: Nose normal.     Mouth/Throat:     Mouth: Mucous membranes are moist.  Eyes:     Pupils: Pupils are equal, round, and reactive to light.  Cardiovascular:     Rate and Rhythm: Normal rate and regular rhythm.  Pulmonary:     Effort: Pulmonary effort is normal. No respiratory distress.  Abdominal:     Palpations: Abdomen is soft.     Tenderness: There is no abdominal tenderness.  Musculoskeletal:     Cervical back: Neck supple.     Comments: Normal range of motion of left knee and ankle, tenderness palpation the posterior calf, no overlying skin changes, no significant swelling, 2+ DP pulse  Skin:    General: Skin is warm and dry.  Neurological:      Mental Status: She is alert and oriented to person, place, and time.  Psychiatric:        Mood and Affect: Mood normal.     ED Results / Procedures /  Treatments   Labs (all labs ordered are listed, but only abnormal results are displayed) Labs Reviewed - No data to display  EKG None  Radiology No results found.  Procedures Procedures (including critical care time)  Medications Ordered in ED Medications  HYDROcodone-acetaminophen (NORCO/VICODIN) 5-325 MG per tablet 1 tablet (1 tablet Oral Given 04/23/20 0142)  enoxaparin (LOVENOX) injection 105 mg (105 mg Subcutaneous Given 04/23/20 0142)    ED Course  I have reviewed the triage vital signs and the nursing notes.  Pertinent labs & imaging results that were available during my care of the patient were reviewed by me and considered in my medical decision making (see chart for details).    MDM Rules/Calculators/A&P                          Patient presents with left leg pain.  Overall nontoxic and vital signs notable for blood pressure of 179/122.  She is afebrile.  Exam is notable for tenderness without overlying skin changes to suggest contusion.  No bony tenderness to suggest fracture.  Given family history, DVT would be a consideration.  She is systemically well-appearing and satting 100% on room air.  Do not feel she needs x-rays.  Patient was given pain medication and 1 dose of Lovenox.  We will have her return for ultrasound to rule out DVT later today.  After history, exam, and medical workup I feel the patient has been appropriately medically screened and is safe for discharge home. Pertinent diagnoses were discussed with the patient. Patient was given return precautions.  Final Clinical Impression(s) / ED Diagnoses Final diagnoses:  Left leg pain    Rx / DC Orders ED Discharge Orders         Ordered    US Venous Img Lower Unilateral Left     Discontinue     04/23/20 0201           Emmylou Bieker, Barbette Hair,  MD 04/23/20 0205

## 2020-04-23 NOTE — Discharge Instructions (Addendum)
Return later today for ultrasound imaging of your leg.

## 2020-04-24 ENCOUNTER — Encounter: Payer: Self-pay | Admitting: Family Medicine

## 2020-04-27 ENCOUNTER — Ambulatory Visit (INDEPENDENT_AMBULATORY_CARE_PROVIDER_SITE_OTHER): Payer: BLUE CROSS/BLUE SHIELD | Admitting: Family Medicine

## 2020-04-27 ENCOUNTER — Encounter: Payer: Self-pay | Admitting: Family Medicine

## 2020-04-27 ENCOUNTER — Other Ambulatory Visit: Payer: Self-pay

## 2020-04-27 VITALS — BP 118/82 | HR 94 | Wt 279.6 lb

## 2020-04-27 DIAGNOSIS — W19XXXD Unspecified fall, subsequent encounter: Secondary | ICD-10-CM | POA: Diagnosis not present

## 2020-04-27 DIAGNOSIS — M79605 Pain in left leg: Secondary | ICD-10-CM

## 2020-04-27 DIAGNOSIS — M546 Pain in thoracic spine: Secondary | ICD-10-CM | POA: Diagnosis not present

## 2020-04-27 DIAGNOSIS — J019 Acute sinusitis, unspecified: Secondary | ICD-10-CM

## 2020-04-27 DIAGNOSIS — F419 Anxiety disorder, unspecified: Secondary | ICD-10-CM | POA: Diagnosis not present

## 2020-04-27 DIAGNOSIS — K219 Gastro-esophageal reflux disease without esophagitis: Secondary | ICD-10-CM

## 2020-04-27 DIAGNOSIS — B9689 Other specified bacterial agents as the cause of diseases classified elsewhere: Secondary | ICD-10-CM

## 2020-04-27 MED ORDER — DICLOFENAC SODIUM 50 MG PO TBEC: 50.0000 mg | DELAYED_RELEASE_TABLET | Freq: Two times a day (BID) | ORAL | 0 refills | Status: AC

## 2020-04-27 MED ORDER — FLUTICASONE PROPIONATE 50 MCG/ACT NA SUSP: 2.0000 | Freq: Every day | NASAL | 6 refills | Status: DC

## 2020-04-27 MED ORDER — PANTOPRAZOLE SODIUM 40 MG PO TBEC: 40.0000 mg | DELAYED_RELEASE_TABLET | Freq: Every day | ORAL | 2 refills | Status: DC

## 2020-04-27 MED ORDER — ESCITALOPRAM OXALATE 10 MG PO TABS: 10.0000 mg | ORAL_TABLET | Freq: Every day | ORAL | 0 refills | Status: DC

## 2020-04-27 NOTE — Patient Instructions (Addendum)
It was very nice to meet you today. Please enjoy the rest of your week. Today you were seen for leg/back pain, anxiety, and acid reflux  For your leg and back pain please continue to rest and complete your daily activities as tolerated, your body will need time to heal after a fall. I have prescribed diclofenac 50 mg twice daily for 10 days for pain relief.  For your anxiety I have prescribed medication and therapy. Lexapro 10 mg daily. Please go to [email or web address] to start therapy. Follow up in 2 weeks for a mood check. Bring ALL of your medications.  For your acid reflux I have prescribe omeprazole     Please call the clinic at [phone] if your symptoms worsen or you have any concerns. It was our pleasure to serve you.

## 2020-04-27 NOTE — Progress Notes (Signed)
SUBJECTIVE:   CHIEF COMPLAINT / HPI:   Summer Brewer last Thursday/friday after slipping on drippings from a garbage bag that was taken out by her daughter. Her left leg went backwards as her right leg went forward. She was seen in urgent care and ED. No fracture not blood clots in the left leg. She is also endorsing lingering back pain on the upper to lower left side. She has tried Naproxen without relief, but Vicodin when seen at urgent care and her mother's muscle relaxer helped.   Stress/Anxiety She endorses mostly caregiver stress with caring for a son recovering from a TBI. He just recently graduated school but is very angry about how is life turned out; "he used to be the cool kid, now he cannot walk". He is in therapy at this time and is somewhat helping but she still worries about him. She worries that she is flaring up her GERD as this is worsening with her level of stress. She feels as if since the fall her own health is worsening. She is frustrated that she is still in pain. She plans to talk with son's therapist as this is already arranged. Has tried celexa in the past but did not help. She would like to try a different medication.   PERTINENT  PMH / PSH: Migraines, GERD, HTN, Depressed mood FamHx: DVT; no personal hx OBJECTIVE:   BP 118/82   Pulse 94   Wt 279 lb 9.6 oz (126.8 kg)   SpO2 100%   BMI 43.79 kg/m   General: Appears well, no acute distress. Age appropriate. Cardiac: RRR, normal heart sounds, no murmurs Respiratory: CTAB, normal effort Abdomen: soft, nontender, nondistended Extremities: LE w/o edema. RE w/ skin surface abrasion. LE full ROM, non tender. Left thoracic/lumbar hypertonic musculature tender to palpation.  Psych: anxious  PHQ9 SCORE ONLY 04/27/2020 11/08/2019 09/20/2019  PHQ-9 Total Score 8 0 0    LEFT LOWER EXTREMITY VENOUS DOPPLER ULTRASOUND  TECHNIQUE: Gray-scale sonography with compression, as well as color and duplex ultrasound, were  performed to evaluate the deep venous system(s) from the level of the common femoral vein through the popliteal and proximal calf veins.  COMPARISON:  None.  FINDINGS: VENOUS  Normal compressibility of the common femoral, superficial femoral, and popliteal veins, as well as the visualized calf veins. Visualized portions of profunda femoral vein and great saphenous vein unremarkable. No filling defects to suggest DVT on grayscale or color Doppler imaging. Doppler waveforms show normal direction of venous flow, normal respiratory phasicity and response to augmentation.  Limited views of the contralateral common femoral vein are unremarkable.  OTHER  None.  Limitations: none  IMPRESSION: No femoropopliteal DVT nor evidence of DVT within the visualized calf veins. If clinical symptoms are inconsistent or if there are persistent or worsening symptoms, further imaging (possibly involving the iliac veins) may be warranted.  Electronically Signed   By: Lucrezia Europe M.D.   On: 04/23/2020 14:44  ASSESSMENT/PLAN:   Fall Subseqent encounter. Occurred 1 week ago. Review DVT ultrasound above. Patient stated urgent care took plain films of the left leg and did not have evidence of fracture. These images are not available to me. Her gait is normal and she is able to bare weight. She has full ROM. She also has hypertonic left back muscle consistent with strain/spasm. Discussed rest, activity as tolerated, and the need for increased healing time. Will treat conservatively for now. If not improved with conservative measures consider re-evaluation with further imaging.  -  Diclofenac 50 mg BID x10 days -RICE instructions  Anxiety Patient with multiple complaints and overly anxious about multiple things since the fall. Will likely need to be addressed a little at a time. Unfortunately did not obtain GAD 7 during this encounter.  Endorses increased stress due to caring for dependent son.  Has therapy line up but not until son's therapy session are through as they will see the same person. Would benefit from both therapy and mediation.  -resource given to start therapy today: [email or web address] -Start lexapro 10 mg daily -F/u in 2 weeks for mood check  -Obtain GAD 7 at f/u visit    Gerlene Fee, [place]

## 2020-04-30 ENCOUNTER — Telehealth: Payer: Self-pay

## 2020-04-30 DIAGNOSIS — M79606 Pain in leg, unspecified: Secondary | ICD-10-CM | POA: Insufficient documentation

## 2020-04-30 DIAGNOSIS — W19XXXA Unspecified fall, initial encounter: Secondary | ICD-10-CM | POA: Insufficient documentation

## 2020-04-30 DIAGNOSIS — M546 Pain in thoracic spine: Secondary | ICD-10-CM | POA: Insufficient documentation

## 2020-04-30 DIAGNOSIS — M79605 Pain in left leg: Secondary | ICD-10-CM | POA: Insufficient documentation

## 2020-04-30 NOTE — Assessment & Plan Note (Signed)
Subseqent encounter. Occurred 1 week ago. Review DVT ultrasound above. Patient stated urgent care took plain films of the left leg and did not have evidence of fracture. These images are not available to me. Her gait is normal and she is able to bare weight. She has full ROM. She also has hypertonic left back muscle consistent with strain/spasm. Discussed rest, activity as tolerated, and the need for increased healing time. Will treat conservatively for now. If not improved with conservative measures consider re-evaluation with further imaging.  -Diclofenac 50 mg BID x10 days -RICE instructions

## 2020-04-30 NOTE — Telephone Encounter (Signed)
Received fax from pharmacy, PA needed on Pantoprazole. Clinical questions submitted via Cover My Meds. Waiting on response, could take up to 72 hours.  Cover My Meds info: Key: [id number]

## 2020-04-30 NOTE — Assessment & Plan Note (Addendum)
Patient with multiple complaints and overly anxious about multiple things since the fall. Will likely need to be addressed a little at a time. Unfortunately did not obtain GAD 7 during this encounter.  Endorses increased stress due to caring for dependent son. Has therapy line up but not until son's therapy session are through as they will see the same person. Would benefit from both therapy and mediation.  -resource given to start therapy today: [email or web address] -Start lexapro 10 mg daily -F/u in 2 weeks for mood check  -Obtain GAD 7 at f/u visit

## 2020-05-03 ENCOUNTER — Other Ambulatory Visit: Payer: Self-pay | Admitting: Family Medicine

## 2020-05-03 MED ORDER — FAMOTIDINE 20 MG PO TABS: 20.0000 mg | ORAL_TABLET | Freq: Two times a day (BID) | ORAL | 1 refills | Status: DC | PRN

## 2020-05-03 NOTE — Progress Notes (Signed)
Pantoprazole refused by insurance. Famotidine sent to pharmacy.

## 2020-05-12 ENCOUNTER — Other Ambulatory Visit: Payer: Self-pay | Admitting: Family Medicine

## 2020-05-20 ENCOUNTER — Other Ambulatory Visit: Payer: Self-pay | Admitting: Family Medicine

## 2020-05-20 DIAGNOSIS — F419 Anxiety disorder, unspecified: Secondary | ICD-10-CM

## 2020-06-15 ENCOUNTER — Other Ambulatory Visit: Payer: Self-pay

## 2020-06-15 ENCOUNTER — Encounter: Payer: Self-pay | Admitting: Family Medicine

## 2020-06-15 ENCOUNTER — Ambulatory Visit (INDEPENDENT_AMBULATORY_CARE_PROVIDER_SITE_OTHER): Payer: BLUE CROSS/BLUE SHIELD | Admitting: Family Medicine

## 2020-06-15 VITALS — BP 150/70 | Ht 67.0 in | Wt 279.5 lb

## 2020-06-15 DIAGNOSIS — M25561 Pain in right knee: Secondary | ICD-10-CM

## 2020-06-15 DIAGNOSIS — G8929 Other chronic pain: Secondary | ICD-10-CM | POA: Diagnosis not present

## 2020-06-15 NOTE — Assessment & Plan Note (Addendum)
Patient has had swelling and pain with knee flexion, extension, and "locking" for 2 months following trauma to the R knee. Conservative measures have elicited minimal improvement. Considering meniscal damage due to persistence of pain and swelling, positive Thessaly testing, and mild laxity on valgus and varus testing. Also considering quadriceps tendon damage due to persistent pain and swelling with radiation up the thigh; however, the lack of pain or gap in tissue above the patella upon palpation of the quadriceps tendon makes this less likely. Also on the differential is septic arthritis due to the persistence of pain and swelling, but the absence of erythema and warmth make this less likely. Will order an MRI of the R knee to assess for meniscal damage. If positive meniscal tear, refer to orthopedic surgery for treatment. If negative, refer to sports medicine for further evaluation.

## 2020-06-15 NOTE — Progress Notes (Addendum)
    SUBJECTIVE:   CHIEF COMPLAINT / HPI:   Summer Brewer (MRN: [id number]) is a 49 y.o. female with a history of back pain, migraines, GERD, HTN, and depressed mood who presents for evaluation of R knee pain. She states that she slipped and fell in the rain on her R knee in August. She went to urgent care the next day for evaluation due to bruising and pain. Urgent care discharged with conservative measures, but her pain has persisted since. She states her knee frequently "catches" and "locks" at random points throughout the day. This locking causes an 8-9/10 pain. When she attempts to flex the knee after it locks, it is very painful and feels like there is a "pulling" that radiates up her R thigh. She has tried oral aleve, naproxen, advil, aleve spray, biofreeze, and hot/cold compresses with minimal relief. She states she has not has any imaging done on this knee before.  PERTINENT  PMH / PSH: back pain, migraines, GERD, HTN, depressed mood  OBJECTIVE:   BP (!) 150/70   Ht 5\' 7"  (1.702 m)   Wt 279 lb 8 oz (126.8 kg)   BMI 43.78 kg/m    PHYSICAL EXAM  GEN: well developed, well-nourished, in NAD EXT: swelling around patellar, lateral, and medial joint spaces of the R knee. No erythema and not warm to the touch. Pain upon Thessaly, valgus, and varus testing of the R knee with a "pulling" sensation up the R thigh  ASSESSMENT/PLAN:   Right knee pain Patient has had swelling and pain with knee flexion, extension, and "locking" for 2 months following trauma to the R knee. Conservative measures have elicited minimal improvement. Considering meniscal damage due to persistence of pain and swelling, positive Thessaly testing, and mild laxity on valgus and varus testing. Also considering quadriceps tendon damage due to persistent pain and swelling with radiation up the thigh; however, the lack of pain or gap in tissue above the patella upon palpation of the quadriceps tendon makes this less likely.  Also on the differential is septic arthritis due to the persistence of pain and swelling, but the absence of erythema and warmth make this less likely. Will order an MRI of the R knee to assess for meniscal damage. If positive meniscal tear, refer to orthopedic surgery for treatment. If negative, refer to sports medicine for further evaluation.   [place]   I was present for the medical decision making and physical exam.  I agree with the documentation above with the following additions:  Physical exam: Positive Thessaly's test.  Some laxity with valgus and varus stress of the right knee.  I am primarily concerned about a meniscal tear.  I am less suspicious of ligamentous injury although that is possible.  Would like to move forward with an MRI.  If MRI shows no significant soft tissue injury, I will refer to physical therapy.  If there is significant soft tissue injury, will refer to orthopedics.  Matilde Haymaker, MD

## 2020-06-15 NOTE — Patient Instructions (Signed)
I am sorry that you still have this knee pain from her injury.  I have ordered an MRI today.  You should get a call early next week about scheduling your MRI.  Depending on the results of the MRI, I will send you to physical therapy or orthopedics.  For pain control, would continue to use the topical Voltaren gel and NSAIDs.

## 2020-06-18 ENCOUNTER — Other Ambulatory Visit: Payer: Self-pay | Admitting: Family Medicine

## 2020-06-18 ENCOUNTER — Encounter: Payer: Self-pay | Admitting: Family Medicine

## 2020-06-18 DIAGNOSIS — G8929 Other chronic pain: Secondary | ICD-10-CM

## 2020-06-18 NOTE — Progress Notes (Signed)
Called for peer-to-peer conversation.  I was told that Summer Brewer would require at least 6 weeks of conservative therapy with home exercises and NSAIDs before she would be considered for an MRI.  She would also need to have the x-rays done prior to the MRI.  The x-rays were ordered and Ms. Paternostro was instructed to go to [facility] imaging for those x-rays.  She was also referred to orthopedics where she would be able to get an MRI more quickly.

## 2020-06-19 ENCOUNTER — Encounter: Payer: Self-pay | Admitting: Family Medicine

## 2020-07-07 ENCOUNTER — Ambulatory Visit
Admission: RE | Admit: 2020-07-07 | Discharge: 2020-07-07 | Disposition: A | Discharge status: Home / Self Care | Payer: No Typology Code available for payment source | Source: Ambulatory Visit | Attending: Family Medicine | Admitting: Family Medicine

## 2020-07-07 ENCOUNTER — Other Ambulatory Visit: Payer: Self-pay

## 2020-07-07 DIAGNOSIS — M25561 Pain in right knee: Secondary | ICD-10-CM

## 2020-07-08 ENCOUNTER — Other Ambulatory Visit: Payer: Self-pay | Admitting: Family Medicine

## 2020-07-08 DIAGNOSIS — I10 Essential (primary) hypertension: Secondary | ICD-10-CM

## 2020-07-09 ENCOUNTER — Encounter: Payer: Self-pay | Admitting: Family Medicine

## 2020-07-18 ENCOUNTER — Other Ambulatory Visit: Payer: Self-pay | Admitting: Family Medicine

## 2020-08-16 ENCOUNTER — Other Ambulatory Visit: Payer: Self-pay | Admitting: Family Medicine

## 2020-08-16 ENCOUNTER — Encounter: Payer: Self-pay | Admitting: Family Medicine

## 2020-08-16 NOTE — Telephone Encounter (Signed)
Patient reports that she has not been able to smell or taste all week. Patient was COVID tested on Sunday 12/5, results were negative.   Patient reports that breathing issues are difficulties with breathing through nose. Patient has been taking mucinex, with little relief. Patient states that she went to [facility] on Sunday and was started on Tamiflu. Patient is requesting additional medication for cough (possibly tessalon)  Current BP 151/93, pulse 77. Denies severe headache, blurry vision, CP, difficulty breathing.   Provided patient with strict ED/UC precautions.   Please advise if these medications can be sent in, as well as further recommendations.   Talbot Grumbling, RN

## 2020-08-17 MED ORDER — BENZONATATE 100 MG PO CAPS: 100.0000 mg | ORAL_CAPSULE | Freq: Two times a day (BID) | ORAL | 0 refills | Status: DC | PRN

## 2020-08-17 NOTE — Telephone Encounter (Signed)
Patient called and informed that medications have been sent to pharmacy.  Talbot Grumbling, RN

## 2020-09-25 ENCOUNTER — Other Ambulatory Visit: Payer: Self-pay | Admitting: Family Medicine

## 2020-09-27 ENCOUNTER — Telehealth (INDEPENDENT_AMBULATORY_CARE_PROVIDER_SITE_OTHER): Payer: 59 | Admitting: Family Medicine

## 2020-09-27 ENCOUNTER — Encounter: Payer: Self-pay | Admitting: Family Medicine

## 2020-09-27 ENCOUNTER — Other Ambulatory Visit: Payer: Self-pay

## 2020-09-27 DIAGNOSIS — U071 COVID-19: Secondary | ICD-10-CM

## 2020-09-27 DIAGNOSIS — B9689 Other specified bacterial agents as the cause of diseases classified elsewhere: Secondary | ICD-10-CM | POA: Diagnosis not present

## 2020-09-27 DIAGNOSIS — I10 Essential (primary) hypertension: Secondary | ICD-10-CM

## 2020-09-27 DIAGNOSIS — J019 Acute sinusitis, unspecified: Secondary | ICD-10-CM

## 2020-09-27 MED ORDER — FLUTICASONE PROPIONATE 50 MCG/ACT NA SUSP
2.0000 | Freq: Every day | NASAL | 6 refills | Status: DC
Start: 2020-09-27 — End: 2021-07-03

## 2020-09-27 NOTE — Assessment & Plan Note (Signed)
Patient is currently on day 5-7 of her COVID-19 infection.  Unclear of when symptoms first started due to patient receiving the booster around the same time but the symptoms started.  Positive contact with her mother who tested positive on 1/12.  She feels like her symptoms are doing okay today.  Encouraged continuation of supportive measures including Tylenol, hot tea with honey, over-the-counter cough syrup (without Sudafed), Flonase (refill sent).  Strict ED precautions given and patient is agreeable to this.

## 2020-09-27 NOTE — Telephone Encounter (Signed)
Called patient to gather more information. Reports exposure with positive rapid testing. Patient reports receiving booster last Thursday 1/13. Patient reports congestion, cough, slight wheezing (denies difficulty breathing), nausea, diarrhea, fatigue. Denies fever.   Patient is requesting virtual visit due to issues with high blood pressure and request for inhaler. Spoke with Dr. Gwendlyn Deutscher who was agreeable to plan for virtual visit. Advised patient to check blood pressure with home cuff when she gets home (currently driving home from testing).   Patient is not currently having any symptoms associated with high blood pressure.   Provided with strict ED precautions in the meantime.   Talbot Grumbling, RN

## 2020-09-27 NOTE — Assessment & Plan Note (Signed)
Patient reports that she felt like her blood pressure was elevated but did not take her blood pressure.  Has been compliant with her medications including propranolol 60 mg daily and amlodipine 10 mg daily.  Is going to check her blood pressure this afternoon and will call if anything is concerning.

## 2020-09-27 NOTE — Progress Notes (Signed)
[place] Telemedicine Visit  Patient consented to have virtual visit and was identified by name and date of birth. Method of visit: Video  Encounter participants: Patient: Summer Brewer - located at home  Provider: Gifford Shave - located at [facility]   Chief Complaint:  Cough, runny nose, congestion HPI: Patient reports that her mother tested positive for COVID-19 on 09/19/2020.  She first noticed symptoms on 09/20/2020 and she also received the booster for COVID-19 on that day.  She reports that she noticed the symptoms prior to the receiving the booster.  She is having cough, runny nose, congestion, body aches, nausea, a little wheezing.  Denies any fever, shortness of breath, chest pain.  She has been taking over-the-counter Tylenol, zinc, elderberry, vitamin B, vitamin D, hot tea with honey.  She has not been taking any over-the-counter cough medications.  She does acknowledge loss of taste and smell.  She went and took a rapid COVID test today which came back positive.  Blood pressure concerns Patient reports that she felt like her blood pressure was elevated this morning but it is gone down.  She was going to take her blood pressure before the visit but has not gotten to it yet.  She is currently taking her propranolol 60 mg daily and amlodipine 10 mg daily.  Patient reports she had a mild headache earlier but it has since resolved.  ROS: per HPI  Pertinent PMHx: Hypertension  Exam:  Wt 279 lb (126.6 kg) Comment: pt reported  BMI 43.70 kg/m   Respiratory: Speaking in full sentences, no wheezes appreciated, no shortness of breath noted  Assessment/Plan:  COVID-19 virus infection Patient is currently on day 5-7 of her COVID-19 infection.  Unclear of when symptoms first started due to patient receiving the booster around the same time but the symptoms started.  Positive contact with her mother who tested positive on 1/12.  She feels like her symptoms are doing  okay today.  Encouraged continuation of supportive measures including Tylenol, hot tea with honey, over-the-counter cough syrup (without Sudafed), Flonase (refill sent).  Strict ED precautions given and patient is agreeable to this.  HYPERTENSION, BENIGN SYSTEMIC Patient reports that she felt like her blood pressure was elevated but did not take her blood pressure.  Has been compliant with her medications including propranolol 60 mg daily and amlodipine 10 mg daily.  Is going to check her blood pressure this afternoon and will call if anything is concerning.    Time spent during visit with patient: 15 minutes

## 2020-10-01 ENCOUNTER — Encounter: Payer: Self-pay | Admitting: Family Medicine

## 2020-10-02 ENCOUNTER — Encounter: Payer: Self-pay | Admitting: Family Medicine

## 2020-10-02 MED ORDER — HYDROXYZINE HCL 10 MG PO TABS: 10.0000 mg | ORAL_TABLET | Freq: Three times a day (TID) | ORAL | 0 refills | Status: DC | PRN

## 2020-11-01 ENCOUNTER — Other Ambulatory Visit: Payer: Self-pay

## 2020-11-01 ENCOUNTER — Ambulatory Visit (INDEPENDENT_AMBULATORY_CARE_PROVIDER_SITE_OTHER): Payer: Self-pay | Admitting: Family Medicine

## 2020-11-01 VITALS — BP 132/88 | HR 73

## 2020-11-01 DIAGNOSIS — J310 Chronic rhinitis: Secondary | ICD-10-CM

## 2020-11-01 DIAGNOSIS — J329 Chronic sinusitis, unspecified: Secondary | ICD-10-CM

## 2020-11-01 MED ORDER — IPRATROPIUM BROMIDE 0.03 % NA SOLN: 2.0000 | Freq: Two times a day (BID) | NASAL | 12 refills | Status: DC

## 2020-11-01 MED ORDER — AFRIN NASAL SPRAY 0.05 % NA SOLN: 1.0000 | Freq: Two times a day (BID) | NASAL | 0 refills | Status: AC

## 2020-11-01 NOTE — Assessment & Plan Note (Addendum)
Symptoms appear most consistent with rhinosinusitis, likely viral in etiology given timeframe.  Long discussion providing education on lack of benefit of antibiotics.  Strongly encourage symptomatic treatment.  Recommended that if no improvement by mid to late next week then will consider starting antibiotic treatment.  - Recommended mucinex, nasal saline spray, netti pot BID, humidification, OTC cold/sinus medicines - Flonase QD - Afrin BID x 3 days then stop (discussed at length) - can trial Atrovent nasal spray if copious thin nasal discharge is present - tylenol/Ibuprofen PRN for pain/discomfort  - If no improvement or worsening by 5-7 days, then can consider starting Augmentin BID x 5 days  - COVID testing per request - ENT referral per patient request

## 2020-11-01 NOTE — Patient Instructions (Signed)
For your Sinusitis: - Recommended mucinex, nasal saline spray, netti pot, humidification, over the counter sinus and cold medicines - Flonase: 2 sprays each nostril daily - Afrin twice a day x 3 days then stop  - tylenol/Ibuprofen PRN for pain/discomfort  - If no improvement in 3-4 days, call the clinic     For your cough, try these:  Teaspoon of honey either alone or mixed with warm water  Tessalon pearls  Lozenges or hard candies  Laying with head elevated  Vicks/menthol rub topically on chest

## 2020-11-01 NOTE — Progress Notes (Signed)
Subjective:   Patient ID: Summer Brewer    DOB: [birth year], 50 y.o. female   MRN: [id number]  Summer Brewer is a 50 y.o. female with a history of hypertension, migraines, GERD, eczema, abnormal uterine bleeding, anemia, anxiety, back pain, depressed mood, obesity here for sinus pain  Sinus pain: Patient notes that she has had about 5 days of ear popping, headache, sore throat, pressure behind right eye, rhinorrhea, wet/dry nonproductive cough.  Notes impairment in smell and taste.  She notes that she has "chronic sinus infections".  She has been seen by ENT in the past with history of nasal polyps that have been removed.  She is Covid vaccinated x3.  Denies any shortness of breath, nausea, or vomiting.  She is treated her symptoms with Flonase twice a day for the last 5 days.  Review of Systems:  Per HPI.   Objective:   BP 132/88   Pulse 73   SpO2 99%  Vitals and nursing note reviewed.  General: pleasant older female, sitting comfortably in exam chair, well nourished, well developed, in no acute distress with non-toxic appearance HEENT: normocephalic, atraumatic, moist mucous membranes, moist swollen turbinates bilaterally, oropharynx clear without erythema or exudate, TM normal bilaterally, no facial tenderness to palpation  CV: regular rate and rhythm without murmurs, rubs, or gallops Lungs: clear to auscultation bilaterally with normal work of breathing on room air, speaking in full sentences Skin: warm, dry  MSK: gait normal Neuro: Alert and oriented, speech normal  Assessment & Plan:   Rhinosinusitis Symptoms appear most consistent with rhinosinusitis, likely viral in etiology given timeframe.  Long discussion providing education on lack of benefit of antibiotics.  Strongly encourage symptomatic treatment.  Recommended that if no improvement by mid to late next week then will consider starting antibiotic treatment.  - Recommended mucinex, nasal saline spray, netti pot BID,  humidification, OTC cold/sinus medicines - Flonase QD - Afrin BID x 3 days then stop (discussed at length) - can trial Atrovent nasal spray if copious thin nasal discharge is present - tylenol/Ibuprofen PRN for pain/discomfort  - If no improvement or worsening by 5-7 days, then can consider starting Augmentin BID x 5 days  - COVID testing per request - ENT referral per patient request  Orders Placed This Encounter  Procedures  . Novel Coronavirus, NAA ([facility])    Order Specific Question:   Is this test for diagnosis or screening    Answer:   Diagnosis of ill patient    Order Specific Question:   Symptomatic for COVID-19 as defined by CDC    Answer:   Yes    Order Specific Question:   Date of Symptom Onset    Answer:   10/28/2020    Order Specific Question:   Hospitalized for COVID-19    Answer:   No    Order Specific Question:   Admitted to ICU for COVID-19    Answer:   No    Order Specific Question:   Resident in a congregate (group) care setting    Answer:   No    Order Specific Question:   Is the patient student?    Answer:   No    Order Specific Question:   Employed in healthcare setting    Answer:   No    Order Specific Question:   Has patient completed COVID vaccination(s) (2 doses of Pfizer/Moderna 1 dose of [employer])    Answer:   Yes    Order  Specific Question:   Has patient completed COVID Booster / 3rd dose    Answer:   Yes    Order Specific Question:   Previously tested for COVID-19    Answer:   Unknown    Order Specific Question:   Pregnant    Answer:   No  . Ambulatory referral to ENT    Referral Priority:   Routine    Referral Type:   Consultation    Referral Reason:   Specialty Services Required    Requested Specialty:   Otolaryngology    Number of Visits Requested:   1   Meds ordered this encounter  Medications  . oxymetazoline (AFRIN NASAL SPRAY) 0.05 % nasal spray    Sig: Place 1 spray into both nostrils 2 (two) times daily for 3 days. DO NOT  USE BEYOND 3 DAYS    Dispense:  14.7 mL    Refill:  0  . ipratropium (ATROVENT) 0.03 % nasal spray    Sig: Place 2 sprays into both nostrils every 12 (twelve) hours. For constant thin nasal drainage    Dispense:  30 mL    Refill:  [place], DO PGY-3, [facility] 11/01/2020 5:24 PM

## 2020-11-03 LAB — SARS-COV-2, NAA 2 DAY TAT

## 2020-11-03 LAB — NOVEL CORONAVIRUS, NAA: SARS-CoV-2, NAA: NOT DETECTED

## 2020-11-05 ENCOUNTER — Encounter: Payer: Self-pay | Admitting: Family Medicine

## 2020-11-06 ENCOUNTER — Other Ambulatory Visit: Payer: Self-pay | Admitting: Family Medicine

## 2020-11-06 DIAGNOSIS — J329 Chronic sinusitis, unspecified: Secondary | ICD-10-CM

## 2020-11-06 MED ORDER — AMOXICILLIN-POT CLAVULANATE 875-125 MG PO TABS: 1.0000 | ORAL_TABLET | Freq: Two times a day (BID) | ORAL | 0 refills | Status: AC

## 2020-11-18 ENCOUNTER — Other Ambulatory Visit: Payer: Self-pay | Admitting: Family Medicine

## 2020-11-18 DIAGNOSIS — F419 Anxiety disorder, unspecified: Secondary | ICD-10-CM

## 2020-11-30 ENCOUNTER — Other Ambulatory Visit: Payer: Self-pay | Admitting: Obstetrics and Gynecology

## 2020-12-05 ENCOUNTER — Other Ambulatory Visit: Payer: Self-pay

## 2020-12-05 ENCOUNTER — Other Ambulatory Visit: Payer: Self-pay | Admitting: Family Medicine

## 2020-12-05 ENCOUNTER — Ambulatory Visit (INDEPENDENT_AMBULATORY_CARE_PROVIDER_SITE_OTHER): Payer: Self-pay

## 2020-12-05 DIAGNOSIS — Z23 Encounter for immunization: Secondary | ICD-10-CM

## 2020-12-05 NOTE — Progress Notes (Signed)
Patient presents to nurse clinic for flu vaccination. Administered in RD, site unremarkable, tolerated injection well.   Adonnis Salceda C Grantley Savage, RN  

## 2020-12-06 MED ORDER — HYDROXYZINE HCL 10 MG PO TABS: 10.0000 mg | ORAL_TABLET | Freq: Three times a day (TID) | ORAL | 0 refills | Status: DC | PRN

## 2020-12-06 MED ORDER — FAMOTIDINE 20 MG PO TABS
20.0000 mg | ORAL_TABLET | Freq: Two times a day (BID) | ORAL | 1 refills | Status: DC | PRN
Start: 2020-12-06 — End: 2020-12-08

## 2020-12-07 ENCOUNTER — Encounter: Payer: Self-pay | Admitting: *Deleted

## 2020-12-08 ENCOUNTER — Other Ambulatory Visit: Payer: Self-pay | Admitting: Family Medicine

## 2020-12-08 MED ORDER — FAMOTIDINE 20 MG PO TABS: 20.0000 mg | ORAL_TABLET | Freq: Two times a day (BID) | ORAL | 1 refills | Status: DC | PRN

## 2021-01-05 ENCOUNTER — Other Ambulatory Visit: Payer: Self-pay | Admitting: Family Medicine

## 2021-01-17 ENCOUNTER — Other Ambulatory Visit: Payer: Self-pay | Admitting: Family Medicine

## 2021-01-23 ENCOUNTER — Ambulatory Visit: Payer: 59

## 2021-02-05 NOTE — Progress Notes (Signed)
Subjective:   Patient ID: Summer Brewer    DOB: 01-11-1971, 50 y.o. female   MRN: [id number]  Summer Brewer is a 50 y.o. female with a history of HTN, migraine, rhinosinusitis, GERD, eczema, AUB, IDA, anxiety, back pain, b/l blurred vision, depressed mood, obesity, right knee pain here for high blood pressure  HTN:  BP: (!) 162/94 today. Currently on Amlodipine 10mg  QD and Propranolol 60mg  QD. Endorses compliance. Non-smoker. Does endorse occasional headaches and vision changes when it gets higher. Denies any current chest pain, SOB, vision changes, or headaches.   Lab Results  Component Value Date   CREATININE 0.96 02/06/2021   CREATININE 0.98 12/24/2016   CREATININE 0.81 08/09/2015   Anxiety  Panic Attacks: Patient notes she feels very anxious. She has a lot of stressors in life at home and at work. She also notes moments of feeling like her heart is beating fast, becomes diaphoretic and she cant breath. This lasts about 15 minutes. There is a family history of this. She does deep breaths and mindful thinking which helps. She feels this is making her blood pressure worse. Has tried Escitalopram and Citalopram without improvement  Review of Systems:  Per HPI.   Objective:   BP (!) 162/94   Pulse 83   Wt 276 lb 3.2 oz (125.3 kg)   SpO2 100%   BMI 43.26 kg/m  Vitals and nursing note reviewed.  General: pleasant older female, sitting comfortably in exam chair, well nourished, well developed, in no acute distress with non-toxic appearance CV: regular rate and rhythm without murmurs, rubs, or gallops Lungs: clear to auscultation bilaterally with normal work of breathing on room air, speaking in full sentences MSK:  gait normal Neuro: Alert and oriented, speech normal  Depression screen [facility] 2/9 02/07/2021 02/06/2021 11/01/2020  Decreased Interest 0 0 0  Down, Depressed, Hopeless 3 3 0  PHQ - 2 Score 3 3 0  Altered sleeping 3 3 -  Tired, decreased energy 2 2 -  Change in  appetite 3 3 -  Feeling bad or failure about yourself  2 2 -  Trouble concentrating 2 2 -  Moving slowly or fidgety/restless 0 0 -  Suicidal thoughts 0 0 -  PHQ-9 Score 15 15 -  Difficult doing work/chores Somewhat difficult Somewhat difficult -  Some recent data might be hidden   GAD 7 : Generalized Anxiety Score 02/07/2021 11/08/2019 06/17/2018  Nervous, Anxious, on Edge 1 0 3  Control/stop worrying 3 0 3  Worry too much - different things 3 0 3  Trouble relaxing 3 0 3  Restless 2 0 3  Easily annoyed or irritable 2 0 3  Afraid - awful might happen 3 0 3  Total GAD 7 Score 17 0 21  Anxiety Difficulty Very difficult - Extremely difficult    Assessment & Plan:   HYPERTENSION, BENIGN SYSTEMIC Chronic. Uncontrolled on Amlodipine and Propranolol. She has undergone extensive work up including CBC, BMP, renin/aldosterone, TSH, UA, and UPC ratio which were all negative. She also had renal artery duplex to rule out renal artery stenosis which was normal. She has never had sleep study. STOP BANG score of 4 indicating high risk of OSA. She has been on Lisinopril 40mg  QD and HCTZ 25mg  QD int he past per chart review. Noted to discontinue due to no improvement. - Start Irbesartan 125mg  QD - BMP today to monitor kidney function - follow up 2 weeks for BP check and repeat BMP - consider  increasing irbesartan at that time if still elevated  - Can also consider adding diuretic like Chlorthalidone vs cardiology referral for resistant HTN - referral for sleep study - working on controlling anxiety/depression which in hopes will improve HTN as well  Anxiety Chronic, severe. With panic attacks. GAD-7: 17, PHQ-9 score 16. Currently only taking Hydroxyzine PRN. No improvement in past with Lexapro and Citalopram per chart review. Will transition to SNRI and follow up 1 month. If no improvement can consider augmenting with scheduled Buspar 10mg  TID.  - Start Effexor 75mg  QD - continue Hydroxyzine  PRN  Depressed mood Chronic. No SI/HI. Plan as above.  Health Maintenance: Colon cancer screen: per patient request and age referral placed.  Orders Placed This Encounter  Procedures  . Basic Metabolic Panel  . Basic metabolic panel    Standing Status:   Future    Standing Expiration Date:   02/06/2022  . Ambulatory referral to Gastroenterology    Referral Priority:   Routine    Referral Type:   Consultation    Referral Reason:   Specialty Services Required    Number of Visits Requested:   1  . Split night study    Standing Status:   Future    Standing Expiration Date:   02/06/2022    Order Specific Question:   Where should this test be performed:    Answer:   [place]  . Cpap titration    Standing Status:   Future    Standing Expiration Date:   02/06/2022    Order Specific Question:   Where should this test be performed:    Answer:   [facility] ordered this encounter  Medications  . irbesartan (AVAPRO) 150 MG tablet    Sig: Take 1 tablet (150 mg total) by mouth at bedtime.    Dispense:  90 tablet    Refill:  3  . venlafaxine XR (EFFEXOR XR) 75 MG 24 hr capsule    Sig: Take 1 capsule (75 mg total) by mouth daily with breakfast.    Dispense:  30 capsule    Refill:  0  . metoCLOPramide (REGLAN) 10 MG tablet    Sig: Take 1 tablet (10 mg total) by mouth every 6 (six) hours as needed for nausea.    Dispense:  10 tablet    Refill:  0     Mina Marble, DO PGY-3, [facility] 02/07/2021 11:51 AM

## 2021-02-06 ENCOUNTER — Ambulatory Visit (INDEPENDENT_AMBULATORY_CARE_PROVIDER_SITE_OTHER): Payer: 59 | Admitting: Family Medicine

## 2021-02-06 ENCOUNTER — Encounter: Payer: Self-pay | Admitting: Family Medicine

## 2021-02-06 ENCOUNTER — Ambulatory Visit: Payer: 59 | Admitting: Family Medicine

## 2021-02-06 ENCOUNTER — Other Ambulatory Visit: Payer: Self-pay

## 2021-02-06 VITALS — BP 162/94 | HR 83 | Wt 276.2 lb

## 2021-02-06 DIAGNOSIS — G43109 Migraine with aura, not intractable, without status migrainosus: Secondary | ICD-10-CM

## 2021-02-06 DIAGNOSIS — I10 Essential (primary) hypertension: Secondary | ICD-10-CM | POA: Diagnosis not present

## 2021-02-06 DIAGNOSIS — F419 Anxiety disorder, unspecified: Secondary | ICD-10-CM

## 2021-02-06 DIAGNOSIS — Z1211 Encounter for screening for malignant neoplasm of colon: Secondary | ICD-10-CM | POA: Diagnosis not present

## 2021-02-06 DIAGNOSIS — F321 Major depressive disorder, single episode, moderate: Secondary | ICD-10-CM

## 2021-02-06 DIAGNOSIS — R4589 Other symptoms and signs involving emotional state: Secondary | ICD-10-CM | POA: Diagnosis not present

## 2021-02-06 MED ORDER — IRBESARTAN 150 MG PO TABS: 150.0000 mg | ORAL_TABLET | Freq: Every day | ORAL | 3 refills | Status: DC

## 2021-02-06 MED ORDER — VENLAFAXINE HCL ER 75 MG PO CP24: 75.0000 mg | ORAL_CAPSULE | Freq: Every day | ORAL | 0 refills | Status: DC

## 2021-02-06 NOTE — Patient Instructions (Signed)
Blood pressure: Start Irbesartan 150mg  daily Continue Amlodipine 10mg  and Propranolol 60mg  daily Follow up on 02/20/21 at 2pm for blood pressure recheck and repeat labs  Anxiety: Start Venlafaxine 75mg  daily Continue Hydroxizine as needed Follow up in 1 month for monitoring

## 2021-02-07 ENCOUNTER — Encounter: Payer: Self-pay | Admitting: Family Medicine

## 2021-02-07 LAB — BASIC METABOLIC PANEL
BUN/Creatinine Ratio: 13 (ref 9–23)
BUN: 12 mg/dL (ref 6–24)
CO2: 21 mmol/L (ref 20–29)
Calcium: 9.2 mg/dL (ref 8.7–10.2)
Chloride: 102 mmol/L (ref 96–106)
Creatinine, Ser: 0.96 mg/dL (ref 0.57–1.00)
Glucose: 85 mg/dL (ref 65–99)
Potassium: 3.9 mmol/L (ref 3.5–5.2)
Sodium: 139 mmol/L (ref 134–144)
eGFR: 73 mL/min/{1.73_m2} (ref >59)

## 2021-02-07 MED ORDER — METOCLOPRAMIDE HCL 10 MG PO TABS
10.0000 mg | ORAL_TABLET | Freq: Four times a day (QID) | ORAL | 0 refills | Status: DC | PRN
Start: 2021-02-07 — End: 2021-09-24

## 2021-02-07 NOTE — Assessment & Plan Note (Signed)
Chronic. No SI/HI. Plan as above.

## 2021-02-07 NOTE — Telephone Encounter (Signed)
Patient calls nurse line to discuss BP and symptoms. Patient reports most recent BP on 179/124. Patient is experiencing headaches and blurry vision at this time. Patient reports that symptoms improve when she is lying down in dark room. Denies chest pain or shortness of breath.   Precepted with Dr. Andria Frames. Advised that patient take additional dose of propranolol. Patient also reports that pharmacy has been behind and she has not been able to pick up irbesartan. Patient will start medication once pharmacy has medication ready. Patient took Excedrin migraine this afternoon between 12:30 and 1:30. Advised that patient could take ibuprofen as well for management of headache.   Scheduled patient in ATC tomorrow morning. ED precautions given.   Talbot Grumbling, RN

## 2021-02-07 NOTE — Assessment & Plan Note (Signed)
Chronic. Uncontrolled on Amlodipine and Propranolol. She has undergone extensive work up including CBC, BMP, renin/aldosterone, TSH, UA, and UPC ratio which were all negative. She also had renal artery duplex to rule out renal artery stenosis which was normal. She has never had sleep study. STOP BANG score of 4 indicating high risk of OSA. She has been on Lisinopril 40mg  QD and HCTZ 25mg  QD int he past per chart review. Noted to discontinue due to no improvement. - Start Irbesartan 125mg  QD - BMP today to monitor kidney function - follow up 2 weeks for BP check and repeat BMP - consider increasing irbesartan at that time if still elevated  - Can also consider adding diuretic like Chlorthalidone vs cardiology referral for resistant HTN - referral for sleep study - working on controlling anxiety/depression which in hopes will improve HTN as well

## 2021-02-07 NOTE — Assessment & Plan Note (Signed)
Chronic, severe. With panic attacks. GAD-7: 17, PHQ-9 score 16. Currently only taking Hydroxyzine PRN. No improvement in past with Lexapro and Citalopram per chart review. Will transition to SNRI and follow up 1 month. If no improvement can consider augmenting with scheduled Buspar 10mg  TID.  - Start Effexor 75mg  QD - continue Hydroxyzine PRN

## 2021-02-08 ENCOUNTER — Other Ambulatory Visit: Payer: Self-pay

## 2021-02-08 ENCOUNTER — Ambulatory Visit (INDEPENDENT_AMBULATORY_CARE_PROVIDER_SITE_OTHER): Payer: 59 | Admitting: Family Medicine

## 2021-02-08 VITALS — BP 122/80 | HR 85 | Ht 67.0 in | Wt 276.6 lb

## 2021-02-08 DIAGNOSIS — Z1159 Encounter for screening for other viral diseases: Secondary | ICD-10-CM

## 2021-02-08 DIAGNOSIS — R5383 Other fatigue: Secondary | ICD-10-CM | POA: Insufficient documentation

## 2021-02-08 DIAGNOSIS — R5381 Other malaise: Secondary | ICD-10-CM | POA: Diagnosis not present

## 2021-02-08 NOTE — Patient Instructions (Signed)
It was a pleasure to see you today!  Thank you for choosing [facility] for your primary care.   Summer Brewer was seen for blood pressure follow up.   Our plans for today were:  Your blood pressure is within normal ranges at this time so I will not change any of your blood pressure medications.  I would recommend holding off on starting the Effexor to manage her symptoms over the weekend.  We will complete some blood work today to check for any signs of anemia or liver function changes that may be contributing to your symptoms of fatigue.  I will notify you of any abnormal results.  To keep you healthy, please keep in mind the following health maintenance items that you are due for:   1. COVID vaccination booster   You should return to our clinic in 2 weeks for blood pressure follow-up.   Best Wishes,   Dr. Alba Cory

## 2021-02-08 NOTE — Progress Notes (Signed)
    SUBJECTIVE:   CHIEF COMPLAINT / HPI: concern for BP with malaise and fatigue    Malaise and Fatigue   Patient presents to clinic today reporting 1 day of headache associated with visual changes.  Patient was concerned that her blood pressure was elevated and checked her blood pressure at home at home.  Blood pressures were elevated so 948 systolic over 546E diastolic.  She reports that her blood pressure prior to leaving home this morning was 166/98.  Patient states that she has been adherent to her antihypertensive regimen of amlodipine, propranolol, and recent irbesartan.  Patient also with history of anxiety with panic attacks for which she was recently prescribed Effexor but has yet to start this prescription.  She reports no new medications since onset of headache and elevated blood pressures.  In clinic, blood pressure is within normal range.  She denies any current chest pain or shortness of breath.  She denies a current headache.  She states that headaches yesterday did not feel similar to her normal migraine.  She denies any unilateral weakness.  She does report that she has generalized fatigue and yesterday had some nausea without emesis.  She denies any dysuria, abdominal pain, diarrhea, cough.  Patient states that she has not been sick recently. Patient reports that her mood feels normal.   PERTINENT  PMH / PSH:  HTN  Migraine   OBJECTIVE:   BP 122/80   Pulse 85   Ht 5\' 7"  (1.702 m)   Wt 276 lb 9.6 oz (125.5 kg)   SpO2 97%   BMI 43.32 kg/m   General: female appearing stated age in no acute distress, tired appearing, no pallor  Cardio: Normal S1 and S2, no S3 or S4. Rhythm is regular. No murmurs or rubs.  Bilateral radial pulses palpable Pulm: Clear to auscultation bilaterally, no crackles, wheezing, or diminished breath sounds. Normal respiratory effort, stable on room air Extremities: Trace lower extremity peripheral edema. Warm/ well perfused.  Neuro: pt alert and  oriented x4    ASSESSMENT/PLAN:   Malaise and fatigue Patient's initial concern for elevated BP with HA and vision changes. All symptoms have since resolved and BP in office WNL, checked multiple times and <140/90.  Patient continues to report fatigue and general malaise. Low suspicion for COVID given lack of respiratory of GI symptoms. Will test for anemia and collect hepatitis C screening as well as hepatic panel given normal BMP 2 days prior to visit.  Hepatic panel  CBC  Hepatitis C screening       Eulis Foster, MD [place]

## 2021-02-08 NOTE — Assessment & Plan Note (Signed)
Patient's initial concern for elevated BP with HA and vision changes. All symptoms have since resolved and BP in office WNL, checked multiple times and <140/90.  Patient continues to report fatigue and general malaise. Low suspicion for COVID given lack of respiratory of GI symptoms. Will test for anemia and collect hepatitis C screening as well as hepatic panel given normal BMP 2 days prior to visit.  Hepatic panel  CBC  Hepatitis C screening

## 2021-02-09 ENCOUNTER — Encounter: Payer: Self-pay | Admitting: Family Medicine

## 2021-02-09 LAB — HEPATIC FUNCTION PANEL
ALT: 10 IU/L (ref 0–32)
AST: 10 IU/L (ref 0–40)
Albumin: 4.1 g/dL (ref 3.8–4.8)
Alkaline Phosphatase: 69 IU/L (ref 44–121)
Bilirubin Total: <0.2 mg/dL (ref 0.0–1.2)
Bilirubin, Direct: <0.1 mg/dL (ref 0.00–0.40)
Total Protein: 7.1 g/dL (ref 6.0–8.5)

## 2021-02-09 LAB — HEPATITIS C ANTIBODY: Hep C Virus Ab: 0.1 s/co ratio (ref 0.0–0.9)

## 2021-02-09 LAB — CBC
Hematocrit: 39 % (ref 34.0–46.6)
Hemoglobin: 12.7 g/dL (ref 11.1–15.9)
MCH: 26.7 pg (ref 26.6–33.0)
MCHC: 32.6 g/dL (ref 31.5–35.7)
MCV: 82 fL (ref 79–97)
Platelets: 174 10*3/uL (ref 150–450)
RBC: 4.76 x10E6/uL (ref 3.77–5.28)
RDW: 13.8 % (ref 11.7–15.4)
WBC: 6.9 10*3/uL (ref 3.4–10.8)

## 2021-02-14 ENCOUNTER — Other Ambulatory Visit: Payer: Self-pay | Admitting: General Practice

## 2021-02-14 MED ORDER — SLYND 4 MG PO TABS: 4.0000 mg | ORAL_TABLET | Freq: Every day | ORAL | 4 refills | Status: DC

## 2021-02-14 NOTE — Progress Notes (Signed)
Opened in error

## 2021-02-20 ENCOUNTER — Ambulatory Visit: Payer: 59

## 2021-02-20 ENCOUNTER — Other Ambulatory Visit: Payer: 59

## 2021-03-01 ENCOUNTER — Other Ambulatory Visit: Payer: Self-pay | Admitting: Family Medicine

## 2021-03-01 DIAGNOSIS — F419 Anxiety disorder, unspecified: Secondary | ICD-10-CM

## 2021-03-01 DIAGNOSIS — R4589 Other symptoms and signs involving emotional state: Secondary | ICD-10-CM

## 2021-03-03 NOTE — Patient Instructions (Addendum)
It was wonderful to see you today.  Please bring ALL of your medications with you to every visit.   Today we talked about:  Your blood pressure. Please resume your amlodipine and propranolol and follow up in 1-2 weeks.   Please be sure to schedule follow up at the front  desk before you leave today.   Please call the clinic at [phone] if your symptoms worsen or you have any concerns. It was our pleasure to serve you.  Dr. Janus Molder

## 2021-03-03 NOTE — Progress Notes (Signed)
     SUBJECTIVE:   CHIEF COMPLAINT / HPI:   Summer Brewer is a 50 yo F who presents for follow up for the following.   Hypertension: - Medications: Amlodipine 10 mg daily, (placed on irbesartan 2 visit prior but was told by last provider to stop taking it). Also takes propranolol for migraine headaches.  - Compliance: Yes, with amlodipine - Checking BP at home: Yes, continues to be high at home - Denies any SOB, CP, vision changes, LE edema, medication SEs, or symptoms of hypotension  Anxiety/Panic attacks Continues to have panic attacks in public. Increase life stressors one significant event of son suffering a devastating TBI. Previously taking hydroxyzine and this has not helped. Has tried Zoloft, lexapro, and most recently took 1 pill of Effexor. States that previous provider voice she should stop taking this medication. She would like a different medication. She has tried Prozac before and this seem to help decrease her panic attacks.   Need for colonoscopy Agrees with referral to colonoscopy.   PERTINENT  PMH / PSH: GERD, anemia  OBJECTIVE:   BP (!) 178/98   Pulse 94   Wt 274 lb 9.6 oz (124.6 kg)   SpO2 98%   BMI 43.01 kg/m   General: Appears well, no acute distress. Age appropriate. Cardiac: RRR, normal heart sounds, no murmurs Respiratory: normal effort  ASSESSMENT/PLAN:   1. HYPERTENSION, BENIGN SYSTEMIC Uncontrolled HTN; Asymptomatic. Has had extensive work up Biomedical scientist Dr. Tarry Kos 02/06/2021 note). Cr reviewed and normal. There was some confusion as to what medication the patient had stopped or continued. With amlodipine on board will start new antihypertensive and follow up BP and BMP.  - Continue amlodipine  - Start losartan (COZAAR) 25 MG tablet; Take 1 tablet (25 mg total) by mouth at bedtime - Follow up in 2 weeks for BP check and repeat BMP  2. Panic disorder Attempted multiple medications without improvement. Effexor was a new medication (took 1 pill) but  patient was recently deterred by previous provider. Will trial prozac for help with panic attack component of anxiety. - Start FLUoxetine (PROZAC) 20 MG capsule; Take 1 capsule (20 mg total) by mouth daily.   - Continue to monitor for improvement  3. Screening for colon cancer - Ambulatory referral to Gastroenterology  Gerlene Fee, [place]

## 2021-03-04 ENCOUNTER — Other Ambulatory Visit: Payer: Self-pay

## 2021-03-04 ENCOUNTER — Ambulatory Visit (INDEPENDENT_AMBULATORY_CARE_PROVIDER_SITE_OTHER): Payer: 59 | Admitting: Family Medicine

## 2021-03-04 VITALS — BP 178/98 | HR 94 | Wt 274.6 lb

## 2021-03-04 DIAGNOSIS — Z1211 Encounter for screening for malignant neoplasm of colon: Secondary | ICD-10-CM | POA: Diagnosis not present

## 2021-03-04 DIAGNOSIS — F410 Panic disorder [episodic paroxysmal anxiety] without agoraphobia: Secondary | ICD-10-CM

## 2021-03-04 DIAGNOSIS — I10 Essential (primary) hypertension: Secondary | ICD-10-CM

## 2021-03-04 MED ORDER — LOSARTAN POTASSIUM 25 MG PO TABS
25.0000 mg | ORAL_TABLET | Freq: Every day | ORAL | 3 refills | Status: DC
Start: 2021-03-04 — End: 2021-06-21

## 2021-03-04 MED ORDER — FLUOXETINE HCL 20 MG PO CAPS: 20.0000 mg | ORAL_CAPSULE | Freq: Every day | ORAL | 0 refills | Status: DC

## 2021-03-07 DIAGNOSIS — F410 Panic disorder [episodic paroxysmal anxiety] without agoraphobia: Secondary | ICD-10-CM | POA: Insufficient documentation

## 2021-03-07 IMAGING — MR MR KNEE*R* W/O CM
8 series · 40 of 40 positions shown · non-contrast
Comparison: None.

CLINICAL DATA: Fell and Katsuhiko.  Persistent knee pain.

EXAM:
MRI OF THE RIGHT KNEE WITHOUT CONTRAST
TECHNIQUE: Multiplanar, multisequence MR imaging of the knee was performed. No
intravenous contrast was administered.

[Series 8: T2 fat-sat · axial · right · 4.0mm · 0.50mm/px · z∈[-97,+40]mm · 6 of 32 slices shown (1 of 4)]
[im 1/32]
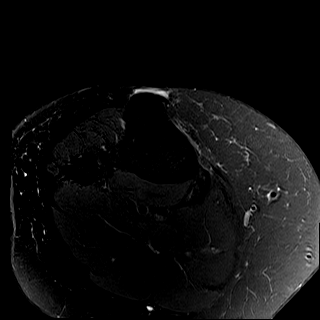
[im 7/32]
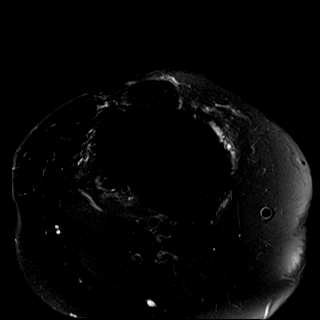
[im 13/32]
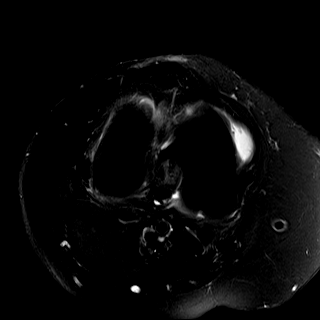
[im 19/32]
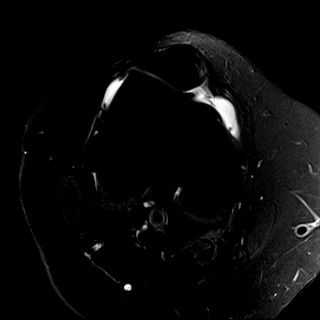
[im 25/32]
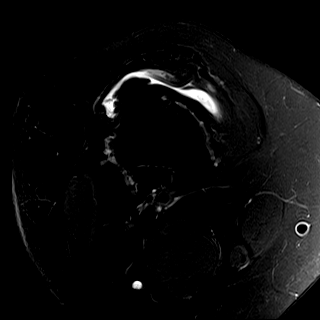
[im 32/32]
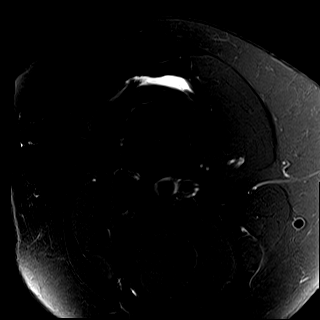

[Series 9: T2 fat-sat · axial · right · 4.0mm · 0.50mm/px · z∈[-55,+80]mm · 6 of 32 slices shown (2 of 4)]
[im 1/32]
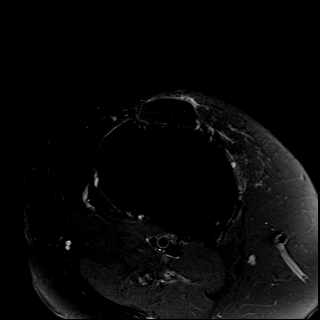
[im 7/32]
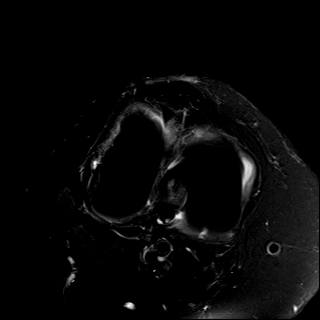
[im 13/32]
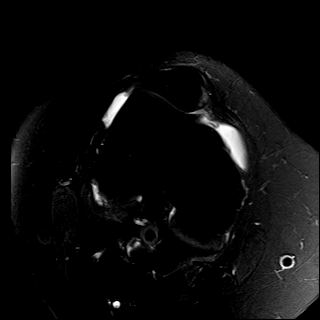
[im 19/32]
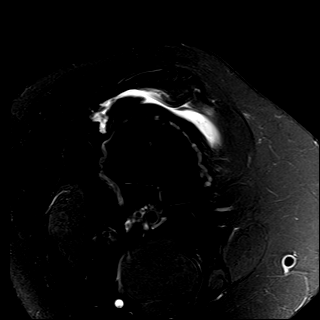
[im 25/32]
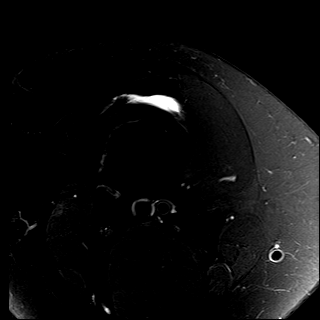
[im 32/32]
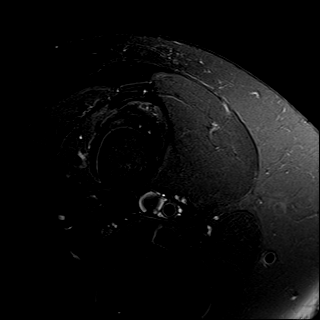

[Series 10: T2 fat-sat · coronal · right · 4.0mm · 0.50mm/px · 5 of 26 slices shown (3 of 4)]
[im 1/26]
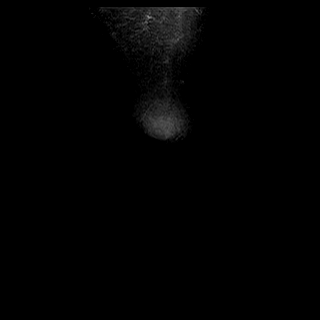
[im 7/26]
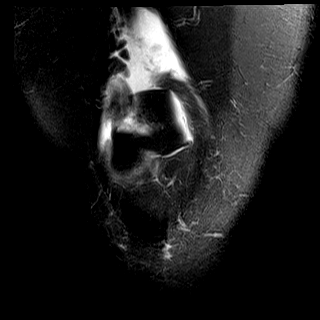
[im 13/26]
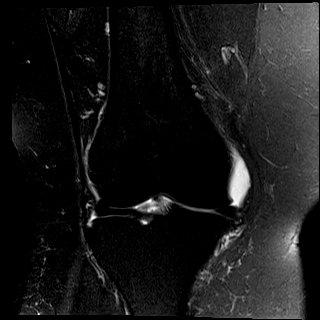
[im 19/26]
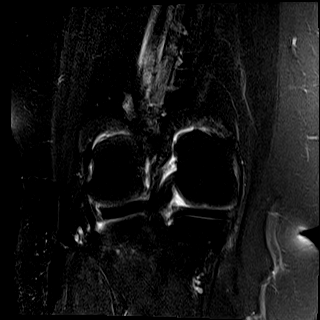
[im 26/26]
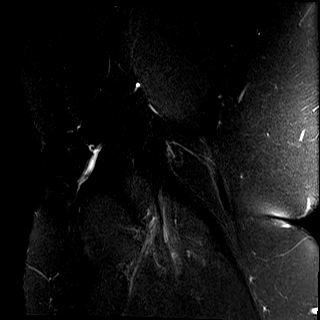

[Series 11: T1 · coronal · right · 4.0mm · 0.50mm/px · 5 of 26 slices shown]
[im 1/26]
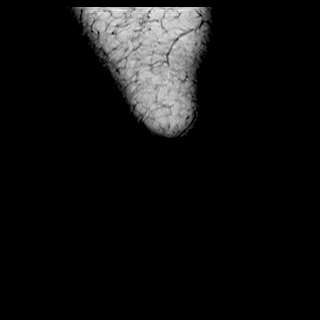
[im 7/26]
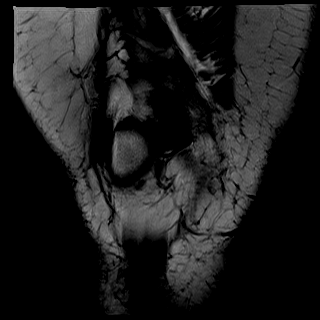
[im 13/26]
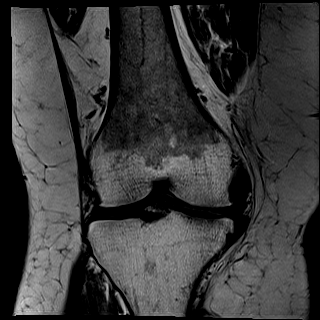
[im 19/26]
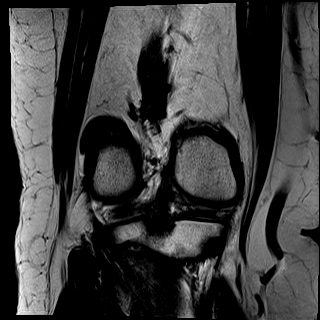
[im 26/26]
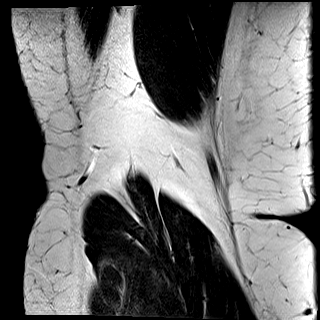

[Series 12: PD fat-sat · coronal · right · 4.0mm · 0.62mm/px · 5 of 26 slices shown (1 of 2)]
[im 1/26]
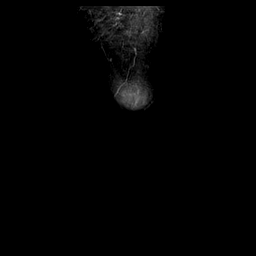
[im 7/26]
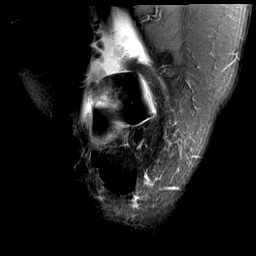
[im 13/26]
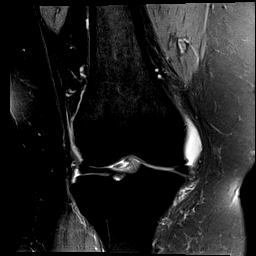
[im 19/26]
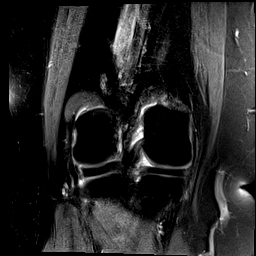
[im 26/26]
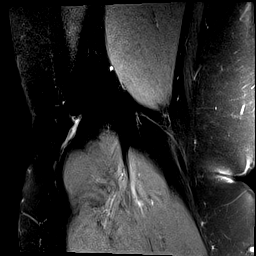

[Series 13: PD fat-sat · sagittal · right · 3.3mm · 0.50mm/px · 5 of 25 slices shown (2 of 2)]
[im 1/25]
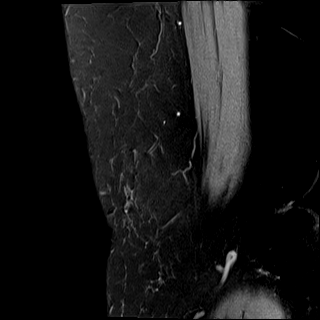
[im 7/25]
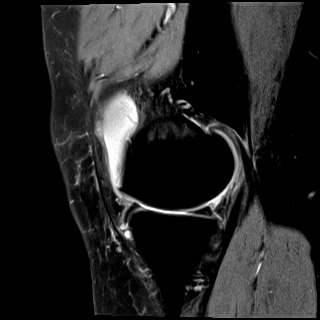
[im 13/25]
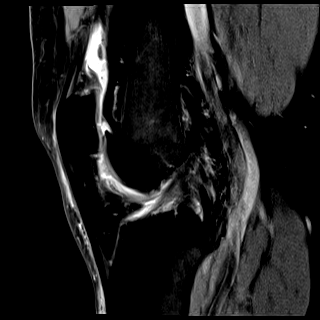
[im 19/25]
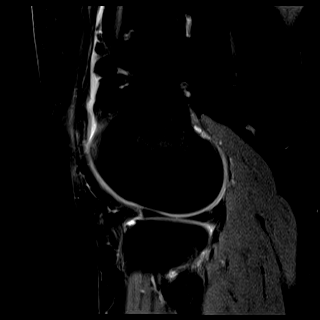
[im 25/25]
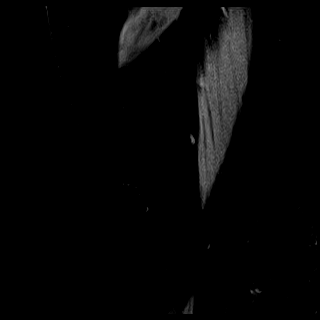

[Series 14: T2 fat-sat · sagittal · right · 3.3mm · 0.50mm/px · 5 of 25 slices shown (4 of 4)]
[im 1/25]
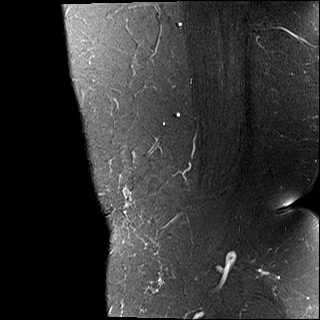
[im 7/25]
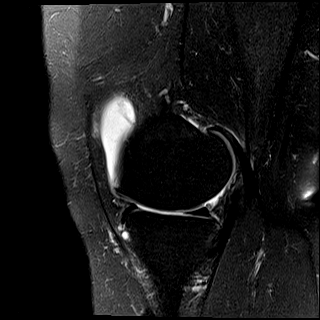
[im 13/25]
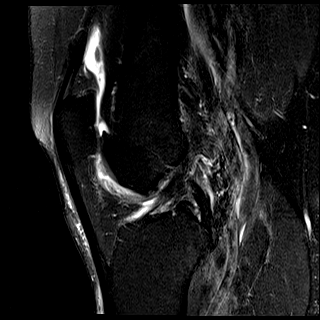
[im 19/25]
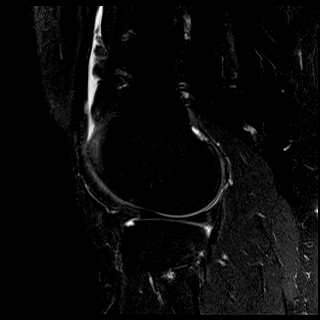
[im 25/25]
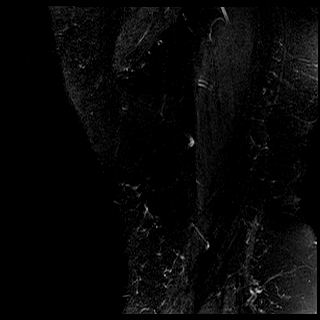

[Series 15: PD · coronal · right · 2.0mm · 0.59mm/px · 3 of 16 slices shown]
[im 1/16]
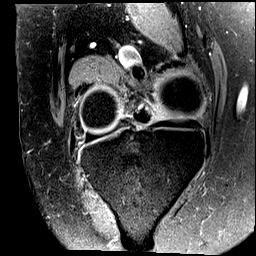
[im 8/16]
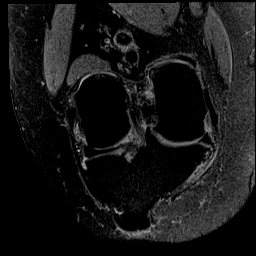
[im 16/16]
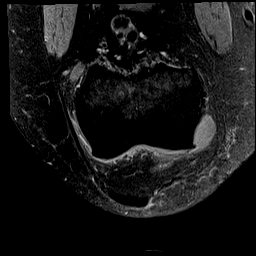

[40 of 40 positions shown; findings below may reference images not displayed]

FINDINGS: MENISCI

Medial meniscus:  Intact

Lateral meniscus:  Intact

LIGAMENTS

Cruciates:  Intact

Collaterals:  Intact

CARTILAGE

Patellofemoral:  Moderate degenerative chondrosis.

Medial: Moderate degenerative chondrosis with early joint space
narrowing and spurring.

Lateral: Moderate degenerative chondrosis with early joint space
narrowing and spurring.

Joint:  Small to moderate-sized joint effusion.

Popliteal Fossa:  No popliteal mass or Baker's cyst.

Extensor Mechanism: The patella retinacular structures are intact
and the quadriceps and patellar tendons are intact.

Bones: No acute bony findings. No bone contusion, marrow edema or
osteochondral lesion.

Other: The knee musculature is unremarkable.
IMPRESSION: 1. Intact ligamentous structures and no acute bony findings.
2. No meniscal tears.
3. Tricompartmental degenerative chondrosis.
4. Small to moderate-sized joint effusion.

## 2021-03-07 NOTE — Assessment & Plan Note (Signed)
Attempted multiple medications without improvement. Effexor was a new medication (took 1 pill) but patient was recently deterred by previous provider. Will trial prozac for help with panic attack component of anxiety. - Start FLUoxetine (PROZAC) 20 MG capsule; Take 1 capsule (20 mg total) by mouth daily.   - Continue to monitor for improvement

## 2021-03-07 NOTE — Assessment & Plan Note (Signed)
Uncontrolled HTN; Asymptomatic. Has had extensive work up Biomedical scientist Dr. Tarry Kos 02/06/2021 note). Cr reviewed and normal. There was some confusion as to what medication the patient had stopped or continued. With amlodipine on board will start new antihypertensive and follow up BP and BMP.  - Continue amlodipine  - Start losartan (COZAAR) 25 MG tablet; Take 1 tablet (25 mg total) by mouth at bedtime - Follow up in 2 weeks for BP check and repeat BMP

## 2021-03-25 ENCOUNTER — Telehealth: Payer: Self-pay | Admitting: *Deleted

## 2021-03-25 NOTE — Telephone Encounter (Signed)
Received a fax from the [facility] with the denial information regarding the sleep study that was ordered for patient.  They are in charge of getting prior authorizations but this one was denied due to" Your Doctor didn't tell us that you already had a home sleep study or that you cannot have a home sleep study. We cannot approve this request."  This statement came from her [employer].  If we could try putting in the orders for a home sleep study, then we can try again and if it gets approved or at the next visit discuss with patient if she has had either of these done and document on it.  Patient also receives a copy of this insurance decision in the mail and asked to review with her PCP.  Will forward to MD.  Johnney Ou

## 2021-03-28 ENCOUNTER — Other Ambulatory Visit: Payer: Self-pay | Admitting: Family Medicine

## 2021-03-28 DIAGNOSIS — F410 Panic disorder [episodic paroxysmal anxiety] without agoraphobia: Secondary | ICD-10-CM

## 2021-04-05 ENCOUNTER — Encounter: Payer: Self-pay | Admitting: Family Medicine

## 2021-04-05 ENCOUNTER — Ambulatory Visit (INDEPENDENT_AMBULATORY_CARE_PROVIDER_SITE_OTHER): Payer: 59 | Admitting: Family Medicine

## 2021-04-05 ENCOUNTER — Other Ambulatory Visit (HOSPITAL_COMMUNITY)
Admission: RE | Admit: 2021-04-05 | Discharge: 2021-04-05 | Disposition: A | Discharge status: Home / Self Care | Payer: 59 | Source: Ambulatory Visit | Attending: Family Medicine | Admitting: Family Medicine

## 2021-04-05 ENCOUNTER — Other Ambulatory Visit: Payer: Self-pay

## 2021-04-05 VITALS — BP 152/96 | HR 81 | Ht 67.0 in | Wt 272.6 lb

## 2021-04-05 DIAGNOSIS — Z01419 Encounter for gynecological examination (general) (routine) without abnormal findings: Secondary | ICD-10-CM

## 2021-04-05 DIAGNOSIS — Z0000 Encounter for general adult medical examination without abnormal findings: Secondary | ICD-10-CM

## 2021-04-05 DIAGNOSIS — Z113 Encounter for screening for infections with a predominantly sexual mode of transmission: Secondary | ICD-10-CM

## 2021-04-05 DIAGNOSIS — L259 Unspecified contact dermatitis, unspecified cause: Secondary | ICD-10-CM

## 2021-04-05 DIAGNOSIS — I10 Essential (primary) hypertension: Secondary | ICD-10-CM | POA: Diagnosis not present

## 2021-04-05 DIAGNOSIS — R300 Dysuria: Secondary | ICD-10-CM

## 2021-04-05 LAB — POCT URINALYSIS DIP (MANUAL ENTRY)
Bilirubin, UA: NEGATIVE
Glucose, UA: NEGATIVE mg/dL
Ketones, POC UA: NEGATIVE mg/dL
Leukocytes, UA: NEGATIVE
Nitrite, UA: NEGATIVE
Protein Ur, POC: NEGATIVE mg/dL
Spec Grav, UA: 1.02 (ref 1.010–1.025)
Urobilinogen, UA: 1 E.U./dL
pH, UA: 7 (ref 5.0–8.0)

## 2021-04-05 LAB — POCT UA - MICROSCOPIC ONLY

## 2021-04-05 MED ORDER — TRIAMCINOLONE ACETONIDE 0.5 % EX OINT: TOPICAL_OINTMENT | Freq: Two times a day (BID) | CUTANEOUS | 0 refills | Status: DC

## 2021-04-05 MED ORDER — HYDROCHLOROTHIAZIDE 12.5 MG PO CAPS: 12.5000 mg | ORAL_CAPSULE | Freq: Every day | ORAL | 0 refills | Status: DC

## 2021-04-05 NOTE — Progress Notes (Signed)
    SUBJECTIVE:   Chief compliant/HPI: annual examination  Summer Brewer is a 50 y.o. who presents today for an annual exam.   History tabs reviewed.   Review of systems form reviewed and notable for dysuria and vaginal odor w/ urgency no increased frequency.   OBJECTIVE:   BP (!) 152/96   Pulse 81   Ht '5\' 7"'$  (1.702 m)   Wt 272 lb 9.6 oz (123.7 kg)   SpO2 98%   BMI 42.70 kg/m   General: Appears well, no acute distress. Age appropriate. Cardiac: RRR, normal heart sounds, no murmurs Respiratory: CTAB, normal effort Abdomen: soft, nontender, nondistended Extremities: No edema or cyanosis. Skin: Warm and dry, no rashes noted Neuro: alert and oriented, no focal deficits Psych: normal affect  ASSESSMENT/PLAN:    Well female exam without gynecological exam Declined pap today. Patient did not feel comfortable with pelvic exam while mensurating. - Reschedule for pap when available.  - See below.   HYPERTENSION, BENIGN SYSTEMIC Uncontrolled. Stopped taking ARB due to itching. 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  Dispense: 30 capsule; Refill: 0 -Follow up in 2 weeks and obtain BMP  Screening for STD (sexually transmitted disease) - Urine cytology ancillary only - HIV antibody (with reflex) - RPR  Dysuria Associated urgency. Will check for UTI - POCT urinalysis dipstick - POCT UA - Microscopic Only  Contact dermatitis Need refill.  - triamcinolone ointment (KENALOG) 0.5 %; Apply topically 2 (two) times daily.  Dispense: 30 g; Refill: 0  Annual Examination  See AVS for recommendations.  PHQ score 3, reviewed.  Blood pressure value is not at goal. See above.  Considered the following screening exams based upon USPSTF recommendations: Diabetes screening:  not ordered. Screening for elevated cholesterol: ordered HIV testing: ordered Hepatitis C: discussed and  not ordered Hepatitis B: discussed and not ordred Syphilis if at high risk: discussed and ordered Reviewed risk factors for latent tuberculosis and not indicated Colorectal cancer screening:  previously odered. Patient will call office   Immunizations UTD.   Follow up in 1 year or sooner if indicated.    Gerlene Fee, [place]

## 2021-04-05 NOTE — Patient Instructions (Signed)
Thank you for coming in for your physical today.  Please reschedule for your Pap.  Today your blood pressure was elevated and you did not like taking losartan so we will start hydrochlorothiazide 12.5 mg daily and follow-up in 2 weeks for another blood pressure recheck and to check your kidney function.  I will notify you of several labs when available.  Dr. Janus Molder

## 2021-04-06 LAB — LIPID PANEL
Chol/HDL Ratio: 5.2 ratio — ABNORMAL HIGH (ref 0.0–4.4)
Cholesterol, Total: 203 mg/dL — ABNORMAL HIGH (ref 100–199)
HDL: 39 mg/dL — ABNORMAL LOW (ref >39)
LDL Chol Calc (NIH): 133 mg/dL — ABNORMAL HIGH (ref 0–99)
Triglycerides: 174 mg/dL — ABNORMAL HIGH (ref 0–149)
VLDL Cholesterol Cal: 31 mg/dL (ref 5–40)

## 2021-04-06 LAB — RPR: RPR Ser Ql: NONREACTIVE

## 2021-04-06 LAB — HIV ANTIBODY (ROUTINE TESTING W REFLEX): HIV Screen 4th Generation wRfx: NONREACTIVE

## 2021-04-08 ENCOUNTER — Telehealth: Payer: Self-pay | Admitting: Family Medicine

## 2021-04-08 DIAGNOSIS — E785 Hyperlipidemia, unspecified: Secondary | ICD-10-CM

## 2021-04-08 LAB — URINE CYTOLOGY ANCILLARY ONLY
Chlamydia: NEGATIVE
Comment: NEGATIVE
Comment: NORMAL
Neisseria Gonorrhea: NEGATIVE

## 2021-04-08 MED ORDER — ATORVASTATIN CALCIUM 40 MG PO TABS: 40.0000 mg | ORAL_TABLET | Freq: Every day | ORAL | 3 refills | Status: DC

## 2021-04-08 NOTE — Telephone Encounter (Signed)
Attempted to call patient to answer questions about urinary symptoms and starting statin medication. LVM.   Gerlene Fee, DO 04/08/2021, 12:23 PM PGY-3, [place]

## 2021-04-08 NOTE — Telephone Encounter (Signed)
Called patient. Discussed urinary odor could be due diet or dehydration. Likely the former considering latter not evident on UA. Also consider issues such as BV. Patient due for pap. Encouraged her to scheduled this at her earliest convenience. We also discussed lipid panel. With shared decision making we will start lipitor 40 mg and lifestyle modifications such as controlling BP, weight loss, dietary changes. Patient voiced understanding and appreciation.   Gerlene Fee, DO 04/08/2021, 3:08 PM PGY-3, [place]

## 2021-04-27 ENCOUNTER — Other Ambulatory Visit: Payer: Self-pay | Admitting: Family Medicine

## 2021-04-27 DIAGNOSIS — I10 Essential (primary) hypertension: Secondary | ICD-10-CM

## 2021-04-27 DIAGNOSIS — F410 Panic disorder [episodic paroxysmal anxiety] without agoraphobia: Secondary | ICD-10-CM

## 2021-05-13 ENCOUNTER — Other Ambulatory Visit: Payer: Self-pay | Admitting: Family Medicine

## 2021-05-13 DIAGNOSIS — I10 Essential (primary) hypertension: Secondary | ICD-10-CM

## 2021-05-15 NOTE — Telephone Encounter (Signed)
Patient need BP check follow up. Was supposed to follow up 2 weeks after our last July visit.   [place], DO 05/15/2021, 9:50 AM PGY-3, [place]

## 2021-05-30 ENCOUNTER — Encounter: Payer: Self-pay | Admitting: Gastroenterology

## 2021-05-31 ENCOUNTER — Other Ambulatory Visit: Payer: Self-pay | Admitting: Family Medicine

## 2021-05-31 DIAGNOSIS — F419 Anxiety disorder, unspecified: Secondary | ICD-10-CM

## 2021-06-06 ENCOUNTER — Ambulatory Visit (AMBULATORY_SURGERY_CENTER): Payer: 59

## 2021-06-06 VITALS — Ht 67.0 in | Wt 273.0 lb

## 2021-06-06 DIAGNOSIS — Z1211 Encounter for screening for malignant neoplasm of colon: Secondary | ICD-10-CM

## 2021-06-06 MED ORDER — ONDANSETRON HCL 4 MG PO TABS: 4.0000 mg | ORAL_TABLET | ORAL | 0 refills | Status: DC | PRN

## 2021-06-06 NOTE — Progress Notes (Signed)
No egg or soy allergy known to patient  No issues known to pt with past sedation with any surgeries or procedures Patient denies ever being told they had issues or difficulty with intubation  No FH of Malignant Hyperthermia Pt is not on diet pills Pt is not on  home 02  Pt is not on blood thinners  Pt denies issues with constipation  No A fib or A flutter  EMMI video to pt or via MyChart   Pt is fully vaccinated  for Covid   Miralax prep used  Zofran rx sent for 2 tablets, take one before each dose of prep  Due to the COVID-19 pandemic we are asking patients to follow certain guidelines.  Pt aware of COVID protocols and [facility] guidelines

## 2021-06-10 ENCOUNTER — Ambulatory Visit: Payer: 59 | Admitting: Family Medicine

## 2021-06-13 ENCOUNTER — Encounter: Payer: Self-pay | Admitting: Family Medicine

## 2021-06-17 ENCOUNTER — Encounter: Payer: Self-pay | Admitting: Family Medicine

## 2021-06-17 DIAGNOSIS — B379 Candidiasis, unspecified: Secondary | ICD-10-CM

## 2021-06-17 MED ORDER — FLUCONAZOLE 150 MG PO TABS: 150.0000 mg | ORAL_TABLET | Freq: Once | ORAL | 0 refills | Status: AC

## 2021-06-19 ENCOUNTER — Encounter: Payer: Self-pay | Admitting: Family Medicine

## 2021-06-21 ENCOUNTER — Encounter: Payer: Self-pay | Admitting: Gastroenterology

## 2021-06-21 ENCOUNTER — Ambulatory Visit (AMBULATORY_SURGERY_CENTER): Payer: 59 | Admitting: Gastroenterology

## 2021-06-21 VITALS — BP 172/112 | HR 94 | Temp 97.8°F | Resp 15 | Ht 67.0 in | Wt 273.0 lb

## 2021-06-21 DIAGNOSIS — Z1211 Encounter for screening for malignant neoplasm of colon: Secondary | ICD-10-CM | POA: Diagnosis present

## 2021-06-21 MED ORDER — SODIUM CHLORIDE 0.9 % IV SOLN: 500.0000 mL | Freq: Once | INTRAVENOUS | Status: DC

## 2021-06-21 NOTE — Patient Instructions (Signed)
Read all of the handouts given to you by your recovery room nurse.  YOU HAD AN ENDOSCOPIC PROCEDURE TODAY AT [facility]:   Refer to the procedure report that was given to you for any specific questions about what was found during the examination.  If the procedure report does not answer your questions, please call your gastroenterologist to clarify.  If you requested that your care partner not be given the details of your procedure findings, then the procedure report has been included in a sealed envelope for you to review at your convenience later.  YOU SHOULD EXPECT: Some feelings of bloating in the abdomen. Passage of more gas than usual.  Walking can help get rid of the air that was put into your GI tract during the procedure and reduce the bloating. If you had a lower endoscopy (such as a colonoscopy or flexible sigmoidoscopy) you may notice spotting of blood in your stool or on the toilet paper. If you underwent a bowel prep for your procedure, you may not have a normal bowel movement for a few days.  Please Note:  You might notice some irritation and congestion in your nose or some drainage.  This is from the oxygen used during your procedure.  There is no need for concern and it should clear up in a day or so.  SYMPTOMS TO REPORT IMMEDIATELY:  Following lower endoscopy (colonoscopy or flexible sigmoidoscopy):  Excessive amounts of blood in the stool  Significant tenderness or worsening of abdominal pains  Swelling of the abdomen that is new, acute  Fever of 100F or higher   For urgent or emergent issues, a gastroenterologist can be reached at any hour by calling [phone]. Do not use MyChart messaging for urgent concerns.    DIET:  We do recommend a small meal at first, but then you may proceed to your regular diet.  Drink plenty of fluids but you should avoid alcoholic beverages for 24 hours.  ACTIVITY:  You should plan to take it easy for the rest of today and  you should NOT DRIVE or use heavy machinery until tomorrow (because of the sedation medicines used during the test).    FOLLOW UP: Our staff will call the number listed on your records 48-72 hours following your procedure to check on you and address any questions or concerns that you may have regarding the information given to you following your procedure. If we do not reach you, we will leave a message.  We will attempt to reach you two times.  During this call, we will ask if you have developed any symptoms of COVID 19. If you develop any symptoms (ie: fever, flu-like symptoms, shortness of breath, cough etc.) before then, please call [phone].  If you test positive for Covid 19 in the 2 weeks post procedure, please call and report this information to [place].      SIGNATURES/CONFIDENTIALITY: You and/or your care partner have signed paperwork which will be entered into your electronic medical record.  These signatures attest to the fact that that the information above on your After Visit Summary has been reviewed and is understood.  Full responsibility of the confidentiality of this discharge information lies with you and/or your care-partner.

## 2021-06-21 NOTE — Op Note (Signed)
[facility] Patient Name: Summer Brewer Procedure Date: 06/21/2021 1:05 PM MRN: [id number] Endoscopist: Thornton Park MD, MD Age: 50 Referring MD:  Date of Birth: 10-Nov-1970 Gender: Female Account #: [id number] Procedure:                Colonoscopy Indications:              Screening for colorectal malignant neoplasm, This                            is the patient's first colonoscopy Medicines:                Monitored Anesthesia Care Procedure:                Pre-Anesthesia Assessment:                           - Prior to the procedure, a History and Physical                            was performed, and patient medications and                            allergies were reviewed. The patient's tolerance of                            previous anesthesia was also reviewed. The risks                            and benefits of the procedure and the sedation                            options and risks were discussed with the patient.                            All questions were answered, and informed consent                            was obtained. Prior Anticoagulants: The patient has                            taken no previous anticoagulant or antiplatelet                            agents. ASA Grade Assessment: II - A patient with                            mild systemic disease. After reviewing the risks                            and benefits, the patient was deemed in                            satisfactory condition to undergo the procedure.  After obtaining informed consent, the colonoscope                            was passed under direct vision. Throughout the                            procedure, the patient's blood pressure, pulse, and                            oxygen saturations were monitored continuously. The                            Olympus PCF-H190DL ([id number]) Colonoscope was                            introduced through the  anus and advanced to the 3                            cm into the ileum. The colonoscopy was technically                            difficult and complex due to a redundant colon,                            significant looping and a tortuous colon.                            Successful completion of the procedure was aided by                            changing the patient's position, withdrawing and                            reinserting the scope, changing endoscopes and                            applying abdominal pressure. The patient tolerated                            the procedure well. The quality of the bowel                            preparation was good. The terminal ileum, ileocecal                            valve, appendiceal orifice, and rectum were                            photographed. Scope In: 1:24:56 PM Scope Out: 2:05:17 PM Scope Withdrawal Time: 0 hours 8 minutes 26 seconds  Total Procedure Duration: 0 hours 40 minutes 21 seconds  Findings:                 The perianal and digital rectal examinations were  normal.                           The colon was significantly tortuous.                           The exam was otherwise without abnormality on                            direct and retroflexion views. Complications:            No immediate complications. Estimated Blood Loss:     Estimated blood loss: none. Impression:               - Tortuous colon.                           - The examination was otherwise normal on direct                            and retroflexion views.                           - No specimens collected. Recommendation:           - Patient has a contact number available for                            emergencies. The signs and symptoms of potential                            delayed complications were discussed with the                            patient. Return to normal activities tomorrow.                             Written discharge instructions were provided to the                            patient.                           - Resume previous diet.                           - Continue present medications.                           - Repeat colonoscopy in 10 years for surveillance,                            earlier with new symptoms.                           - Emerging evidence supports eating a diet of  fruits, vegetables, grains, calcium, and yogurt                            while reducing red meat and alcohol may reduce the                            risk of colon cancer.                           - Thank you for allowing me to be involved in your                            colon cancer prevention. Thornton Park MD, MD 06/21/2021 2:11:25 PM This report has been signed electronically.

## 2021-06-21 NOTE — Progress Notes (Signed)
Pt Drowsy. VSS. To PACU, report to RN. No anesthetic complications noted.  

## 2021-06-21 NOTE — Progress Notes (Signed)
   Referring Provider: Gerlene Fee, DO Primary Care Physician:  Gerlene Fee, DO  Reason for Procedure:  Colon cancer screening   IMPRESSION:  Need for colon cancer screening Appropriate candidate for monitored anesthesia care  PLAN: Colonoscopy in the [place] today   HPI: Summer Brewer is a 50 y.o. female presents for screening colonoscopy.  No prior colonoscopy or colon cancer screening.  No baseline GI symptoms.   No known family history of colon cancer or polyps. No family history of uterine/endometrial cancer, pancreatic cancer or gastric/stomach cancer.   Past Medical History:  Diagnosis Date   Back pain    Eczema    GERD (gastroesophageal reflux disease)    Hypertension    Migraines    Umbilical hernia     Past Surgical History:  Procedure Laterality Date   CESAREAN SECTION  2000   x2 and 2002   INSERTION OF MESH  08/09/2015   Procedure: INSERTION OF MESH;  Surgeon: Donnie Mesa, MD;  Location: [facility];  Service: General;;   NASAL POLYP EXCISION Bilateral 2016   pt states she had polyps removed on both sides   TUBAL LIGATION     UMBILICAL HERNIA REPAIR  08/09/2015   Procedure: UMBILICAL HERNIA REPAIR;  Surgeon: Donnie Mesa, MD;  Location: [facility];  Service: General;;       Allergies as of 06/21/2021 - Review Complete 06/21/2021  Allergen Reaction Noted   Latex Hives and Itching 11/19/2010    Family History  Problem Relation Age of Onset   Hypertension Mother    Deep vein thrombosis Mother    Hypertension Father    Cancer - Prostate Father    Hypertension Other    Deep vein thrombosis Other    Leukemia Other    Colon cancer Neg Hx    Colon polyps Neg Hx    Esophageal cancer Neg Hx    Stomach cancer Neg Hx    Rectal cancer Neg Hx      Physical Exam: General:   Alert,  well-nourished, pleasant and cooperative in NAD Head:  Normocephalic and atraumatic. Eyes:  Sclera clear, no icterus.   Conjunctiva pink. Mouth:  No deformity  or lesions.   Neck:  Supple; no masses or thyromegaly. Lungs:  Clear throughout to auscultation.   No wheezes. Heart:  Regular rate and rhythm; no murmurs. Abdomen:  Soft, non-tender, nondistended, normal bowel sounds, no rebound or guarding.  Msk:  Symmetrical. No boney deformities LAD: No inguinal or umbilical LAD Extremities:  No clubbing or edema. Neurologic:  Alert and  oriented x4;  grossly nonfocal Skin:  No obvious rash or bruise. Psych:  Alert and cooperative. Normal mood and affect.    Cinthya Bors L. Tarri Glenn, MD, MPH 06/21/2021, 1:17 PM

## 2021-06-24 ENCOUNTER — Encounter: Payer: Self-pay | Admitting: Family Medicine

## 2021-06-24 ENCOUNTER — Other Ambulatory Visit (HOSPITAL_COMMUNITY)
Admission: RE | Admit: 2021-06-24 | Discharge: 2021-06-24 | Disposition: A | Discharge status: Home / Self Care | Payer: 59 | Source: Ambulatory Visit | Attending: Family Medicine | Admitting: Family Medicine

## 2021-06-24 ENCOUNTER — Ambulatory Visit (INDEPENDENT_AMBULATORY_CARE_PROVIDER_SITE_OTHER): Payer: 59 | Admitting: Family Medicine

## 2021-06-24 ENCOUNTER — Other Ambulatory Visit: Payer: Self-pay

## 2021-06-24 VITALS — BP 142/92 | HR 86 | Ht 67.0 in | Wt 268.0 lb

## 2021-06-24 DIAGNOSIS — N898 Other specified noninflammatory disorders of vagina: Secondary | ICD-10-CM | POA: Insufficient documentation

## 2021-06-24 DIAGNOSIS — Z23 Encounter for immunization: Secondary | ICD-10-CM | POA: Diagnosis not present

## 2021-06-24 DIAGNOSIS — Z1159 Encounter for screening for other viral diseases: Secondary | ICD-10-CM | POA: Diagnosis not present

## 2021-06-24 DIAGNOSIS — Z113 Encounter for screening for infections with a predominantly sexual mode of transmission: Secondary | ICD-10-CM | POA: Diagnosis not present

## 2021-06-24 NOTE — Patient Instructions (Addendum)
Thank you for coming in today. As soon as labs are available I can notify you. If it is abnormal I will call you, if normal we can communicate via Mychart.   Dr. Janus Molder

## 2021-06-24 NOTE — Progress Notes (Signed)
     SUBJECTIVE:   CHIEF COMPLAINT / HPI:   Summer Brewer is a 50 yo F who presents for the issues below.   Vaginal irritation Continued to have vaginal pruritis despite being treated for yeast infection. Would like STD testing and wet prep today. Is sexually active and using IUD for birth control at this time. However, she would like her IUD removed as she believes this is the source of prolong heavy menstrual periods. Her menstrual cycles is light at the moment.   PERTINENT  PMH / PSH: Tx for yeast infection 1 week prior.   OBJECTIVE:   BP (!) 142/92   Pulse 86   Ht 5\' 7"  (1.702 m)   Wt 121.6 kg   LMP 06/12/2021   SpO2 100%   BMI 41.97 kg/m   General: Appears well, no acute distress. Age appropriate. Respiratory: normal effort Pelvic exam: normal external genitalia, vulva, vagina, cervix, uterus and adnexa.  ASSESSMENT/PLAN:   1. Vaginal irritation Wet prep not obtained. Pelvic swab send out completed due to lack of office staff personnel. Will notify patient when results are available.  - Cervicovaginal ancillary only  2. Screening for STD (sexually transmitted disease) - HIV antibody (with reflex) - RPR  3. Encounter for screening for other viral diseases - HIV antibody (with reflex) - RPR   Of note, discussed patient scheduling for IUD removal if desired.   Gerlene Fee, [place]

## 2021-06-25 ENCOUNTER — Telehealth: Payer: Self-pay

## 2021-06-25 ENCOUNTER — Other Ambulatory Visit: Payer: Self-pay | Admitting: Family Medicine

## 2021-06-25 DIAGNOSIS — B9689 Other specified bacterial agents as the cause of diseases classified elsewhere: Secondary | ICD-10-CM

## 2021-06-25 DIAGNOSIS — N760 Acute vaginitis: Secondary | ICD-10-CM

## 2021-06-25 LAB — CERVICOVAGINAL ANCILLARY ONLY
Bacterial Vaginitis (gardnerella): POSITIVE — AB
Candida Glabrata: NEGATIVE
Candida Vaginitis: NEGATIVE
Chlamydia: NEGATIVE
Comment: NEGATIVE
Comment: NEGATIVE
Comment: NEGATIVE
Comment: NEGATIVE
Comment: NEGATIVE
Comment: NORMAL
Neisseria Gonorrhea: NEGATIVE
Trichomonas: NEGATIVE

## 2021-06-25 LAB — HIV ANTIBODY (ROUTINE TESTING W REFLEX): HIV Screen 4th Generation wRfx: NONREACTIVE

## 2021-06-25 LAB — RPR: RPR Ser Ql: NONREACTIVE

## 2021-06-25 MED ORDER — METRONIDAZOLE 500 MG PO TABS: 500.0000 mg | ORAL_TABLET | Freq: Two times a day (BID) | ORAL | 0 refills | Status: AC

## 2021-06-25 NOTE — Progress Notes (Signed)
Error.   Gerlene Fee, DO 06/25/2021, 6:45 PM PGY-3, [place]'

## 2021-06-25 NOTE — Telephone Encounter (Signed)
  Follow up Call-  Call back number 06/21/2021  Post procedure Call Back phone  # [phone]  Permission to leave phone message Yes  Some recent data might be hidden     Patient questions:  Do you have a fever, pain , or abdominal swelling? No. Pain Score  0 *  Have you tolerated food without any problems? Yes.    Have you been able to return to your normal activities? Yes.    Do you have any questions about your discharge instructions: Diet   No. Medications  No. Follow up visit  No.  Do you have questions or concerns about your Care? No.  Actions: * If pain score is 4 or above: No action needed, pain <4.

## 2021-06-27 DIAGNOSIS — Z23 Encounter for immunization: Secondary | ICD-10-CM | POA: Diagnosis not present

## 2021-07-01 ENCOUNTER — Other Ambulatory Visit: Payer: Self-pay | Admitting: Family Medicine

## 2021-07-01 DIAGNOSIS — B9689 Other specified bacterial agents as the cause of diseases classified elsewhere: Secondary | ICD-10-CM

## 2021-07-17 ENCOUNTER — Encounter: Payer: Self-pay | Admitting: Family Medicine

## 2021-07-17 DIAGNOSIS — B9689 Other specified bacterial agents as the cause of diseases classified elsewhere: Secondary | ICD-10-CM

## 2021-07-17 DIAGNOSIS — N760 Acute vaginitis: Secondary | ICD-10-CM

## 2021-07-19 MED ORDER — METRONIDAZOLE 0.75 % VA GEL
1.0000 | Freq: Every day | VAGINAL | 0 refills | Status: AC
Start: 2021-07-19 — End: 2021-07-24

## 2021-07-22 ENCOUNTER — Ambulatory Visit: Payer: 59 | Admitting: Family Medicine

## 2021-07-22 ENCOUNTER — Other Ambulatory Visit: Payer: Self-pay

## 2021-07-22 ENCOUNTER — Ambulatory Visit (INDEPENDENT_AMBULATORY_CARE_PROVIDER_SITE_OTHER): Payer: 59 | Admitting: Family Medicine

## 2021-07-22 ENCOUNTER — Encounter: Payer: Self-pay | Admitting: Family Medicine

## 2021-07-22 VITALS — BP 172/98 | HR 90 | Ht 67.0 in | Wt 271.8 lb

## 2021-07-22 DIAGNOSIS — N898 Other specified noninflammatory disorders of vagina: Secondary | ICD-10-CM

## 2021-07-22 DIAGNOSIS — I10 Essential (primary) hypertension: Secondary | ICD-10-CM

## 2021-07-22 DIAGNOSIS — N920 Excessive and frequent menstruation with regular cycle: Secondary | ICD-10-CM | POA: Diagnosis not present

## 2021-07-22 DIAGNOSIS — B3731 Acute candidiasis of vulva and vagina: Secondary | ICD-10-CM

## 2021-07-22 DIAGNOSIS — Z30432 Encounter for removal of intrauterine contraceptive device: Secondary | ICD-10-CM | POA: Diagnosis not present

## 2021-07-22 MED ORDER — FLUCONAZOLE 150 MG PO TABS: 150.0000 mg | ORAL_TABLET | ORAL | 0 refills | Status: AC

## 2021-07-22 MED ORDER — HYDROCHLOROTHIAZIDE 12.5 MG PO CAPS: 25.0000 mg | ORAL_CAPSULE | Freq: Every day | ORAL | 0 refills | Status: DC

## 2021-07-22 NOTE — Progress Notes (Signed)
    SUBJECTIVE:   CHIEF COMPLAINT / HPI:   Ms. Scroggins is a 50 yo F who presents for the below.   IUD removal, vaginal irritation, heavy periods Would like to have IUD removed today. She initially had the IUD inserted to help with heavy menstrual bleeding but this has continued. She also feel she has had a lot of vaginal irritation since it was inserted. She continued to have pruritis and white discharge despite BV treatment last week. She plans to follow up with her OBGYN about heavy periods.   Hypertension: - Medications: Amlodipine 10 mg daily and  HCTZ 12.5 mg daily - Compliance: Yes - Denies any SOB, CP, vision changes, LE edema, medication SEs, or symptoms of hypotension -Endorses headache  PERTINENT  PMH / PSH: As above.   OBJECTIVE:   BP (!) 162/103   Pulse 90   Ht 5\' 7"  (1.702 m)   Wt 271 lb 12.8 oz (123.3 kg)   SpO2 99%   BMI 42.57 kg/m   General: Appears well, no acute distress. Age appropriate. Respiratory: normal effort Pelvic exam: VULVA: normal appearing vulva with no masses, tenderness or lesions, VAGINA: vaginal discharge - white, copious, curd-like, and thick, CERVIX: normal appearing cervix without discharge or lesions, IUD strings identified, WET MOUNT done - results: indeterminate due to sample thickness, exam chaperoned by Ubaldo Glassing, CMA. Extremities: No edema or cyanosis. Skin: Warm and dry, no rashes noted Neuro: alert and oriented Psych: normal affect  PROCEDURE NOTE: IUD REMOVAL AND RE-INSERTION IUD REMOVAL: Patient given informed consent for IUD removal and re-insertion of new IUD. Signed copy in the chart. Appropriate time out taken. Sterile technique used. Patient placed in the lithotomy position and the cervix brought into view using speculum. The IUD strings were identified coming from the cervical os. These strings were grasped with ring forceps, and the IUD withdrawn gently from the uterus. There were no complications and no blood loss.    She was  given handouts for post procedure instructions and information about the IUD including a card with the time of recommended removal.   ASSESSMENT/PLAN:   Encounter for IUD removal  Menorrhagia with regular cycle IUD initially placed to decreased menstrual bleeding. She is s/p BTL for contraception. Continues to have heavy menstrual bleeding. Patient would like to follow up with OBGYN for other options. Consider endometrial ablation. Today IUD removed without issue.   Vaginal irritation  Vulvovaginal candidiasis Recently treated for BV without resolution. Vaginal discharge is thick white and clumpy and patient experiencing pruritis. Viewed wet prep under microscope myself in the absence of our lab tech. Yeast buds not easily seen due to thickness of vaginal discharge but will treat for yeast given symptoms.  - POCT Wet Prep (Wet Mount) - fluconazole (DIFLUCAN) 150 MG tablet; Take 1 tablet (150 mg total) by mouth every 3 (three) days for 2 doses.  Dispense: 2 tablet; Refill: 0   HYPERTENSION, BENIGN SYSTEMIC Elevated today. Will increase HCTZ today. Continue amlodipine. Follow up in 2 weeks for a BP recheck.  - hydrochlorothiazide (MICROZIDE) 12.5 MG capsule; Take 2 capsules (25 mg total) by mouth daily.  Dispense: 30 capsule; Refill: 0  Gerlene Fee, [place]

## 2021-07-22 NOTE — Patient Instructions (Addendum)
Thank you for coming in today removed your IUD.  Please visit or call your OB/GYN to make an appointment for heavy menstrual bleeding.  Your blood pressure was elevated today I have increased your medication.  Follow-up in 2 weeks for blood pressure recheck.  I will treat you for a yeast infection. Take 1 pill every 3 days for 2 doses.   Dr. Janus Molder

## 2021-07-29 ENCOUNTER — Other Ambulatory Visit: Payer: Self-pay | Admitting: Family Medicine

## 2021-07-29 DIAGNOSIS — I10 Essential (primary) hypertension: Secondary | ICD-10-CM

## 2021-07-30 ENCOUNTER — Encounter: Payer: Self-pay | Admitting: Family Medicine

## 2021-07-30 ENCOUNTER — Telehealth: Payer: Self-pay

## 2021-07-30 ENCOUNTER — Other Ambulatory Visit: Payer: Self-pay | Admitting: Family Medicine

## 2021-07-30 DIAGNOSIS — N924 Excessive bleeding in the premenopausal period: Secondary | ICD-10-CM

## 2021-07-30 NOTE — Telephone Encounter (Signed)
Error

## 2021-07-30 NOTE — Telephone Encounter (Signed)
Called patient to discuss mychart message.  Patient reports that she has been having heavy bleeding since last Monday. IUD removal on 07/22/21. Patient reports having multiple clots of various sizes. Largest clot bigger than ping pong ball. Saturating super sized tampon and pad every two hours. Patient denies lightheadedness and dizziness. Reports feeling slightly more tired than usual.    Patient is unable to come in for appointment this morning. (We only had availability this AM, no other availability until next Monday) Spoke with Dr. Thompson Grayer regarding patient who advised we could do a lab only visit to check hemoglobin. Patient declined lab visit at this time. Patient expresses frustration with why bleeding was not further addressed at last appointment. Patient states that we should have reached out to [facility] for follow up. Patient states that she needs a new referral to OBGYN as she is no longer an established patient.   ED/UC precautions given. Patient becomes frustrated with recommendation as she states that she will be told to come back to our office for follow up. Explained to patient that while she will need follow up appointment, she should really be evaluated for clots and amount of bleeding. Patient states "thanks for nothing" and phone was disconnected.   Will forward to PCP and preceptor.   Talbot Grumbling, RN

## 2021-07-30 NOTE — Telephone Encounter (Signed)
Called patient. See telephone note.   Talbot Grumbling, RN

## 2021-07-30 NOTE — Telephone Encounter (Signed)
I have placed referral to Gyn and replied her MyChart message recommending UC/ED eval. I also ordered pelvic U/S. Jarrett Soho, can you help her schedule this appointment? Thanks.

## 2021-07-31 ENCOUNTER — Ambulatory Visit: Payer: 59

## 2021-07-31 ENCOUNTER — Other Ambulatory Visit: Payer: Self-pay | Admitting: Family Medicine

## 2021-07-31 DIAGNOSIS — I10 Essential (primary) hypertension: Secondary | ICD-10-CM

## 2021-07-31 NOTE — Telephone Encounter (Signed)
LMOVM for patient to callback.  Asked to speak with manager in [place] message.  Christen Bame, CMA

## 2021-07-31 NOTE — Telephone Encounter (Signed)
Scheduled patient ultrasound at [facility] for next available appointment. Appointment will be on 08/13/21 at 3:30 pm at [address] location. Patient will need to arrive with full bladder. Recommend patient drinking 32 oz of water 1 hour prior to appointment.   [place] message sent with this information.   Talbot Grumbling, RN

## 2021-08-07 ENCOUNTER — Ambulatory Visit: Payer: 59 | Admitting: Family Medicine

## 2021-08-11 ENCOUNTER — Other Ambulatory Visit: Payer: Self-pay | Admitting: Family Medicine

## 2021-08-11 DIAGNOSIS — L259 Unspecified contact dermatitis, unspecified cause: Secondary | ICD-10-CM

## 2021-08-13 ENCOUNTER — Ambulatory Visit
Admission: RE | Admit: 2021-08-13 | Discharge: 2021-08-13 | Disposition: A | Discharge status: Home / Self Care | Payer: 59 | Source: Ambulatory Visit | Attending: Family Medicine | Admitting: Family Medicine

## 2021-08-13 DIAGNOSIS — N924 Excessive bleeding in the premenopausal period: Secondary | ICD-10-CM

## 2021-08-14 ENCOUNTER — Telehealth: Payer: Self-pay | Admitting: Family Medicine

## 2021-08-14 ENCOUNTER — Encounter: Payer: Self-pay | Admitting: Family Medicine

## 2021-08-14 NOTE — Telephone Encounter (Signed)
Pelvic US result discussed - fibroid, endometrial thickness and ovarian cyst.  Referral to Gyn was placed a while back and I advised her to f/u as previously planned with Gyn for her AUB and abnormal pelvic US report. She agreed with the plan.  US Pelvic Complete With Transvaginal  Result Date: 08/14/2021 CLINICAL DATA:  Excessive uterine bleeding for 3 months EXAM: TRANSABDOMINAL AND TRANSVAGINAL ULTRASOUND OF PELVIS TECHNIQUE: Both transabdominal and transvaginal ultrasound examinations of the pelvis were performed. Transabdominal technique was performed for global imaging of the pelvis including uterus, ovaries, adnexal regions, and pelvic cul-de-sac. It was necessary to proceed with endovaginal exam following the transabdominal exam to visualize the uterus and adnexae in better detail. COMPARISON:  07/23/2022 FINDINGS: Uterus Measurements: 9.7 x 4.6 x 5.9 cm = volume: 135 mL. Heterogeneous with small anterior uterine body isoechoic mass measuring 1.8 x 1.4 x 1.7 cm consistent with a small fibroid. Additional submucosal hypoechoic nodule measures only 10 mm remains nonspecific for small fibroid or polyp. Endometrium Thickness: 13 mm. Nonspecific mild heterogeneity. Fluid within the lower uterine segment and endocervical canal. Right ovary Measurements: 3.6 x 2.3 x 2.6 cm = volume: 11.5 mL. Normal in size. Echogenic shadowing focus medially may represent small calcification. Left ovary Measurements: 3.4 x 2.8 x 3.4 cm = volume: 19 mL. Left ovary contains a hypoechoic cyst measuring 2.6 x 2.2 x 2.1 cm Other findings No abnormal free fluid. IMPRESSION: Possible small anterior and submucosal fibroids, largest diameter 1.8 cm. Nonspecific fluid within the lower uterine segment and endocervical canal. 2.6 cm left ovarian cyst No followup imaging recommended. Note: This recommendation does not apply to premenarchal patients or to those with increased risk (genetic, family history, elevated tumor markers or other  high-risk factors) of ovarian cancer. Reference: Radiology 2019 Nov; 293(2):359-371. Electronically Signed   By: Jerilynn Mages.  Shick M.D.   On: 08/14/2021 13:20

## 2021-08-20 ENCOUNTER — Other Ambulatory Visit: Payer: Self-pay | Admitting: Family Medicine

## 2021-08-20 DIAGNOSIS — F419 Anxiety disorder, unspecified: Secondary | ICD-10-CM

## 2021-08-20 DIAGNOSIS — R4589 Other symptoms and signs involving emotional state: Secondary | ICD-10-CM

## 2021-09-04 ENCOUNTER — Ambulatory Visit (INDEPENDENT_AMBULATORY_CARE_PROVIDER_SITE_OTHER): Payer: 59 | Admitting: Family Medicine

## 2021-09-04 ENCOUNTER — Other Ambulatory Visit: Payer: Self-pay

## 2021-09-04 ENCOUNTER — Other Ambulatory Visit: Payer: Self-pay | Admitting: Family Medicine

## 2021-09-04 ENCOUNTER — Encounter: Payer: Self-pay | Admitting: Family Medicine

## 2021-09-04 VITALS — BP 132/78 | HR 94 | Wt 268.8 lb

## 2021-09-04 DIAGNOSIS — N924 Excessive bleeding in the premenopausal period: Secondary | ICD-10-CM

## 2021-09-04 MED ORDER — MEGESTROL ACETATE 20 MG PO TABS: 20.0000 mg | ORAL_TABLET | Freq: Every day | ORAL | 1 refills | Status: DC

## 2021-09-04 MED ORDER — MEGESTROL ACETATE 40 MG PO TABS: 40.0000 mg | ORAL_TABLET | Freq: Every day | ORAL | 1 refills | Status: DC

## 2021-09-04 NOTE — Patient Instructions (Signed)
It was great seeing you today!  Today we discussed your ongoing bleeding, I am sorry it has not improved. I wanted to add on a medication called megace, please take 40 mg daily in addition to your existing medications.   Please make sure to follow up with gynecology next month.   Please follow up at your next scheduled appointment, if anything arises between now and then, please don't hesitate to contact our office.   Thank you for allowing [place] to be a part of your medical care!  Thank you, Dr. Larae Grooms

## 2021-09-04 NOTE — Progress Notes (Signed)
° ° °  SUBJECTIVE:   CHIEF COMPLAINT / HPI:   Patient presents with vaginal bleeding for about 6 months, PCP removed IUD a little over a month ago. Continues to bleed. She is on an oral progestin only at this time. Recommended to obtain ultrasound which was done a few weeks ago.Transvaginal US notable for possible fibroids and 2.6 cm left ovarian cyst with nonspecific fluid within the lower uterine segment and endocervical canal. Says that the bleeding has not improved. In the past 24 hours, uses about 7 pads and 8 tampons. This recent episode, she has been bleeding 22/28 days. Gynecology referral placed but unable to get in until the end of [place]. Her goal today is that she would like to limit the bleeding prior to going to gynecology.    OBJECTIVE:   BP 132/78    Pulse 94    Wt 268 lb 12.8 oz (121.9 kg)    LMP 08/13/2021    SpO2 98%    BMI 42.10 kg/m   General: Patient well-appearing, in no acute distress. CV: RRR, no murmurs or gallops auscultated  Resp: CTAB Abdomen: soft, nontender, nondistended, presence of bowel sounds Psych: mood appropriate   ASSESSMENT/PLAN:   Excessive bleeding in premenopausal period -likely secondary to fibroid and ovarian cyst noted on transvaginal US  -instructed to continue oral progestin  -prescribed megace 40 mg daily -follow up with gynecology in Jan 2023     Donney Dice, [place]

## 2021-09-04 NOTE — Assessment & Plan Note (Addendum)
-  likely secondary to fibroid and ovarian cyst noted on transvaginal US  -instructed to continue oral progestin  -prescribed megace 40 mg daily -follow up with gynecology in Jan 2023

## 2021-09-24 ENCOUNTER — Other Ambulatory Visit: Payer: Self-pay

## 2021-09-24 ENCOUNTER — Encounter: Payer: Self-pay | Admitting: Family Medicine

## 2021-09-24 DIAGNOSIS — G43109 Migraine with aura, not intractable, without status migrainosus: Secondary | ICD-10-CM

## 2021-09-25 ENCOUNTER — Other Ambulatory Visit: Payer: Self-pay

## 2021-09-26 ENCOUNTER — Other Ambulatory Visit: Payer: Self-pay

## 2021-09-26 ENCOUNTER — Ambulatory Visit (INDEPENDENT_AMBULATORY_CARE_PROVIDER_SITE_OTHER): Payer: Self-pay | Admitting: Family Medicine

## 2021-09-26 VITALS — BP 136/82 | HR 90 | Ht 67.0 in | Wt 274.2 lb

## 2021-09-26 DIAGNOSIS — R4589 Other symptoms and signs involving emotional state: Secondary | ICD-10-CM

## 2021-09-26 DIAGNOSIS — N3000 Acute cystitis without hematuria: Secondary | ICD-10-CM

## 2021-09-26 DIAGNOSIS — R300 Dysuria: Secondary | ICD-10-CM

## 2021-09-26 DIAGNOSIS — F419 Anxiety disorder, unspecified: Secondary | ICD-10-CM

## 2021-09-26 DIAGNOSIS — G43109 Migraine with aura, not intractable, without status migrainosus: Secondary | ICD-10-CM

## 2021-09-26 DIAGNOSIS — K219 Gastro-esophageal reflux disease without esophagitis: Secondary | ICD-10-CM

## 2021-09-26 LAB — POCT URINALYSIS DIP (CLINITEK)
Bilirubin, UA: NEGATIVE
Glucose, UA: NEGATIVE mg/dL
Ketones, POC UA: NEGATIVE mg/dL
Nitrite, UA: POSITIVE — AB
POC PROTEIN,UA: NEGATIVE
Spec Grav, UA: 1.02 (ref 1.010–1.025)
Urobilinogen, UA: 1 E.U./dL
pH, UA: 6 (ref 5.0–8.0)

## 2021-09-26 LAB — POCT UA - MICROSCOPIC ONLY

## 2021-09-26 MED ORDER — METOCLOPRAMIDE HCL 10 MG PO TABS: 10.0000 mg | ORAL_TABLET | Freq: Four times a day (QID) | ORAL | 0 refills | Status: DC | PRN

## 2021-09-26 MED ORDER — ELETRIPTAN HYDROBROMIDE 20 MG PO TABS: ORAL_TABLET | ORAL | 1 refills | Status: DC

## 2021-09-26 MED ORDER — CEPHALEXIN 500 MG PO CAPS: 500.0000 mg | ORAL_CAPSULE | Freq: Four times a day (QID) | ORAL | 0 refills | Status: AC

## 2021-09-26 MED ORDER — HYDROXYZINE HCL 10 MG PO TABS: 5.0000 mg | ORAL_TABLET | Freq: Every evening | ORAL | 0 refills | Status: DC

## 2021-09-26 MED ORDER — FAMOTIDINE 20 MG PO TABS: 20.0000 mg | ORAL_TABLET | Freq: Two times a day (BID) | ORAL | 1 refills | Status: DC | PRN

## 2021-09-26 NOTE — Patient Instructions (Addendum)
Thank you for coming to see me today. It was a pleasure.   You have a urine infection. Take Keflex 1 tablet at breakfast, lunch, dinner and before bedtime for 5 days.  Start Prozac daily  Refill Atarax 5 mg at night Refill your Migraine medication and Pepcid  Consider Neurology for migraine management  Please follow-up with PCP in 2 weeks  If you have any questions or concerns, please do not hesitate to call the office at [phone].  Best,   Carollee Leitz, MD     Outpatient Mental Health Providers (No Insurance required or Self Pay)  [place]  [phone] [email or web address] [place] [facility] and [facility]  [address] [place], [place] Crisis [phone]  [facility]) can see uninsured folks for outpatient therapy [email or web address] [address] [place], [place] [address] [phone]  [place] Mon-Fri, 8am-3pm [email or web address], [place], [place]   [place] [phone]- [facility] for psych med management, there may be a wait- if [facility] is working with clients for OPT, they will coordinate with [place] for [place]   Walk-in-Clinic: Monday- Friday 9:00 AM - 4:00 PM [place], [place] [phone]  [facility] [place] ([employer]) walk in M-F 8am-12pm and  1pm-3pm [place]- [place]  [place]  Phone: [phone]  [facility] ([place] and substance challenges) [address], [place] [phone]    [email or web address], [place]     Phone:  [phone] [place]  [phone]     [facility] ( virtual or zoom therapy) [email or web address]  [place]  [place]    [place] [phone]  grief counseling, dementia and caregiver support    [facility] Walk-in MWF 12:30 to 3:00     [place] [address]  [phone]  [email or web address] call to schedule an appointment     [facility] on Mental Illness (NAMI) [place]- Wellness classes, Support groups        [address] [place], [place] [address] [phone]   CurrentJokes.cz   [facility]  (Psycho-social Rehabilitation clubhouse, Individual and group therapy) [address], [place] [address]   [phone]  24- Hour Availability:  *[place] or [phone] * [facility] [employer] (Domestic Violence, Rape, etc. )[phone] [place] [phone] or [phone] * [place] [phone] only) [phone] (after hours) *[facility] Mobile Crisis [facility] [phone] *[place] National Suicide Hotline [phone] Diamantina Monks)

## 2021-09-26 NOTE — Progress Notes (Signed)
° ° °  SUBJECTIVE:   CHIEF COMPLAINT / HPI: urinary infection  Pain with urination for 2 weeks.  Fouls smelling urine.  Endorses frequency and urgency.  Has been taking AZO intermittently without relief.  Denies fevers, abdominal pain or back pain.  Requesting refill for chronic migraines.  Has been ongoing for years.  Previously seen by Neurology and reports has tried multiple prophylactic therapy without relief.    Mood Patient reports having been under a lot of stress. She has been taking care of her 49 year old son with history of TBI.  Was prescribed Prozac but has not been taking regularly.  PERTINENT  PMH / PSH:  AUB followed by OB/Gyne Chronic Migraines Mood disorder HTN   OBJECTIVE:   BP 136/82    Pulse 90    Ht 5\' 7"  (1.702 m)    Wt 274 lb 3.2 oz (124.4 kg)    SpO2 98%    BMI 42.95 kg/m    General: Alert, no acute distress Cardio: Normal S1 and S2, RRR, no r/m/g Pulm: CTAB, normal work of breathing Abdomen: Bowel sounds normal. Abdomen soft and non-tender. No CVA tenderness Extremities: No peripheral edema.    ASSESSMENT/PLAN:   UTI (urinary tract infection) Symptomatic UTI.  Urinalysis positive for nitrites and leukocytes -Keflex 500 mg QID x 5 days -Follow up if no improvement  Anxiety Refill Atarax 5 mg qhs Mental health resources provided Follow up in 1-2 weeks with PCP  Depressed mood No SI/HI.  Not taking Prozac regularly.   Recommend restarting Prozac 20 mg daily Mental Health resources provided Follow up in 2 weeks with PCP  Migraine Refill Relpax Recommend follow up with Neurology.  May benefit from [place] therapy. Follow up with PCP as needed  GERD (gastroesophageal reflux disease) Refill Pepcid 20 mg BID     Carollee Leitz, MD [place]

## 2021-09-27 ENCOUNTER — Other Ambulatory Visit: Payer: Self-pay | Admitting: Family Medicine

## 2021-09-27 DIAGNOSIS — N924 Excessive bleeding in the premenopausal period: Secondary | ICD-10-CM

## 2021-09-29 ENCOUNTER — Encounter: Payer: Self-pay | Admitting: Family Medicine

## 2021-09-29 NOTE — Assessment & Plan Note (Signed)
Refill Atarax 5 mg qhs Mental health resources provided Follow up in 1-2 weeks with PCP

## 2021-09-29 NOTE — Assessment & Plan Note (Signed)
Symptomatic UTI.  Urinalysis positive for nitrites and leukocytes -Keflex 500 mg QID x 5 days -Follow up if no improvement

## 2021-09-29 NOTE — Assessment & Plan Note (Signed)
Refill Pepcid 20 mg BID

## 2021-09-29 NOTE — Assessment & Plan Note (Signed)
Refill Relpax Recommend follow up with Neurology.  May benefit from [place] therapy. Follow up with PCP as needed

## 2021-09-29 NOTE — Assessment & Plan Note (Signed)
No SI/HI.  Not taking Prozac regularly.   Recommend restarting Prozac 20 mg daily Mental Health resources provided Follow up in 2 weeks with PCP

## 2021-10-07 ENCOUNTER — Ambulatory Visit (INDEPENDENT_AMBULATORY_CARE_PROVIDER_SITE_OTHER): Payer: 59 | Admitting: Obstetrics and Gynecology

## 2021-10-07 ENCOUNTER — Encounter: Payer: Self-pay | Admitting: Obstetrics and Gynecology

## 2021-10-07 ENCOUNTER — Other Ambulatory Visit (HOSPITAL_COMMUNITY)
Admission: RE | Admit: 2021-10-07 | Discharge: 2021-10-07 | Disposition: A | Discharge status: Home / Self Care | Payer: Self-pay | Source: Ambulatory Visit | Attending: Obstetrics and Gynecology | Admitting: Obstetrics and Gynecology

## 2021-10-07 ENCOUNTER — Other Ambulatory Visit: Payer: Self-pay

## 2021-10-07 VITALS — BP 142/95 | HR 98 | Ht 67.5 in | Wt 271.0 lb

## 2021-10-07 DIAGNOSIS — N924 Excessive bleeding in the premenopausal period: Secondary | ICD-10-CM

## 2021-10-07 DIAGNOSIS — Z01812 Encounter for preprocedural laboratory examination: Secondary | ICD-10-CM

## 2021-10-07 DIAGNOSIS — Z01411 Encounter for gynecological examination (general) (routine) with abnormal findings: Secondary | ICD-10-CM

## 2021-10-07 DIAGNOSIS — N939 Abnormal uterine and vaginal bleeding, unspecified: Secondary | ICD-10-CM

## 2021-10-07 LAB — POCT URINE PREGNANCY: Preg Test, Ur: NEGATIVE

## 2021-10-07 MED ORDER — MEGESTROL ACETATE 40 MG PO TABS: 40.0000 mg | ORAL_TABLET | Freq: Every day | ORAL | 4 refills | Status: DC

## 2021-10-07 NOTE — Progress Notes (Signed)
NEW GYN presents for AEX/PAP. C/o AUB, pain 10/10.  UPT today is Negative.

## 2021-10-07 NOTE — Progress Notes (Signed)
Subjective:     Summer Brewer is a 51 y.o. female P2 with BMI 42 who is is here for a comprehensive physical exam. The patient reports persistent abnormal vaginal bleeding. Patient reports heavy vaginal bleeding since Mirena IUD removal without improvement on progestin only pills. Patient reports using super tampons and pads and needing to change them 8 times a day. She describes the presence of large clots and often soiling her clothes. Patient is ready for surgical management with endometrial ablation as previously discussed. Patient is without any other complaints. She is sexually active without complaints. She denies pelvic pain or abnormal discharge. She denies urinary incontinence  Past Medical History:  Diagnosis Date   Anemia    Back pain    Eczema    Fibroid    GERD (gastroesophageal reflux disease)    Hypertension    Migraines    Preterm labor    Umbilical hernia    Vaginal Pap smear, abnormal    Past Surgical History:  Procedure Laterality Date   CESAREAN SECTION  2000   x2 and 2002   INSERTION OF MESH  08/09/2015   Procedure: INSERTION OF MESH;  Surgeon: Donnie Mesa, MD;  Location: [facility] OR;  Service: General;;   NASAL POLYP EXCISION Bilateral 2016   pt states she had polyps removed on both sides   TUBAL LIGATION     UMBILICAL HERNIA REPAIR  08/09/2015   Procedure: UMBILICAL HERNIA REPAIR;  Surgeon: Donnie Mesa, MD;  Location: [facility];  Service: General;;   Family History  Problem Relation Age of Onset   Hypertension Mother    Deep vein thrombosis Mother    Hypertension Father    Cancer - Prostate Father    Cancer Paternal Grandmother    Hypertension Other    Deep vein thrombosis Other    Leukemia Other    Colon cancer Neg Hx    Colon polyps Neg Hx    Esophageal cancer Neg Hx    Stomach cancer Neg Hx    Rectal cancer Neg Hx      Social History   Socioeconomic History   Marital status: Married    Spouse name: Not on file   Number of children: 2    Years of education: Not on file   Highest education level: Not on file  Occupational History   Not on file  Tobacco Use   Smoking status: Never   Smokeless tobacco: Never  Vaping Use   Vaping Use: Never used  Substance and Sexual Activity   Alcohol use: No   Drug use: No   Sexual activity: Yes    Birth control/protection: Surgical, I.U.D.  Other Topics Concern   Not on file  Social History Narrative   Not on file   Social Determinants of Health   Financial Resource Strain: Not on file  Food Insecurity: Not on file  Transportation Needs: Not on file  Physical Activity: Not on file  Stress: Not on file  Social Connections: Not on file  Intimate Partner Violence: Not on file   Health Maintenance  Topic Date Due   MAMMOGRAM  07/16/2016   COVID-19 Vaccine (3 - Booster for [place] series) 02/29/2020   PAP SMEAR-Modifier  09/19/2022   TETANUS/TDAP  11/07/2029   COLONOSCOPY (Pts 45-51yrs Insurance coverage will need to be confirmed)  06/22/2031   INFLUENZA VACCINE  Completed   Hepatitis C Screening  Completed   HIV Screening  Completed   HPV VACCINES  Aged Out  Review of Systems Pertinent items noted in HPI and remainder of comprehensive ROS otherwise negative.   Objective:  Blood pressure (!) 142/95, pulse 98, height 5' 7.5" (1.715 m), weight 271 lb (122.9 kg).    GENERAL: Well-developed, well-nourished female in no acute distress.  HEENT: Normocephalic, atraumatic. Sclerae anicteric.  NECK: Supple. Normal thyroid.  LUNGS: Clear to auscultation bilaterally.  HEART: Regular rate and rhythm. BREASTS: Symmetric in size. No palpable masses or lymphadenopathy, skin changes, or nipple drainage. ABDOMEN: Soft, nontender, nondistended. No organomegaly. PELVIC: Normal external female genitalia. Vagina is pink and rugated.  Normal discharge. Normal appearing cervix. Uterus is normal in size. No adnexal mass or tenderness. Chaperone present during the pelvic  exam EXTREMITIES: No cyanosis, clubbing, or edema, 2+ distal pulses.  US Pelvic Complete With Transvaginal  Result Date: 08/14/2021 CLINICAL DATA:  Excessive uterine bleeding for 3 months EXAM: TRANSABDOMINAL AND TRANSVAGINAL ULTRASOUND OF PELVIS TECHNIQUE: Both transabdominal and transvaginal ultrasound examinations of the pelvis were performed. Transabdominal technique was performed for global imaging of the pelvis including uterus, ovaries, adnexal regions, and pelvic cul-de-sac. It was necessary to proceed with endovaginal exam following the transabdominal exam to visualize the uterus and adnexae in better detail. COMPARISON:  07/23/2022 FINDINGS: Uterus Measurements: 9.7 x 4.6 x 5.9 cm = volume: 135 mL. Heterogeneous with small anterior uterine body isoechoic mass measuring 1.8 x 1.4 x 1.7 cm consistent with a small fibroid. Additional submucosal hypoechoic nodule measures only 10 mm remains nonspecific for small fibroid or polyp. Endometrium Thickness: 13 mm. Nonspecific mild heterogeneity. Fluid within the lower uterine segment and endocervical canal. Right ovary Measurements: 3.6 x 2.3 x 2.6 cm = volume: 11.5 mL. Normal in size. Echogenic shadowing focus medially may represent small calcification. Left ovary Measurements: 3.4 x 2.8 x 3.4 cm = volume: 19 mL. Left ovary contains a hypoechoic cyst measuring 2.6 x 2.2 x 2.1 cm Other findings No abnormal free fluid. IMPRESSION: Possible small anterior and submucosal fibroids, largest diameter 1.8 cm. Nonspecific fluid within the lower uterine segment and endocervical canal. 2.6 cm left ovarian cyst No followup imaging recommended. Note: This recommendation does not apply to premenarchal patients or to those with increased risk (genetic, family history, elevated tumor markers or other high-risk factors) of ovarian cancer. Reference: Radiology 2019 Nov; 293(2):359-371. Electronically Signed   By: Jerilynn Mages.  Shick M.D.   On: 08/14/2021 13:20      Assessment:     Healthy female exam.      Plan:    Pap smear collected Screening mammogram ordered STI screening per patient request (patient reports blood work with PCP) ENDOMETRIAL BIOPSY     The indications for endometrial biopsy were reviewed.   Risks of the biopsy including cramping, bleeding, infection, uterine perforation, inadequate specimen and need for additional procedures  were discussed. The patient states she understands and agrees to undergo procedure today. Consent was signed. Time out was performed. Urine HCG was negative. A sterile speculum was placed in the patient's vagina and the cervix was prepped with Betadine. A single-toothed tenaculum was placed on the anterior lip of the cervix to stabilize it. The uterine cavity was sounded to a depth of 9 cm using the uterine sound. The 3 mm pipelle was introduced into the endometrial cavity without difficulty, 2 passes were made.  A  moderate amount of tissue was  sent to pathology. The instruments were removed from the patient's vagina. Minimal bleeding from the cervix was noted. The patient tolerated the procedure well.  Routine post-procedure instructions were given to the patient. The patient will follow up in two weeks to review the results and for further management.   Discussed endometrial ablation. Risks, benefits and alternatives were explained including but not limited to risks of bleeding, infection and damage to adjacent organs. Patient verbalized understanding and all questions were answered  Refill on megace provided until surgical interventions.  See After Visit Summary for Counseling Recommendations

## 2021-10-08 LAB — CERVICOVAGINAL ANCILLARY ONLY
Chlamydia: NEGATIVE
Comment: NEGATIVE
Comment: NORMAL
Neisseria Gonorrhea: NEGATIVE

## 2021-10-09 ENCOUNTER — Encounter: Payer: Self-pay | Admitting: Obstetrics and Gynecology

## 2021-10-09 LAB — CYTOLOGY - PAP
Comment: NEGATIVE
Diagnosis: UNDETERMINED — AB
High risk HPV: NEGATIVE

## 2021-10-09 LAB — SURGICAL PATHOLOGY

## 2021-10-13 ENCOUNTER — Other Ambulatory Visit: Payer: Self-pay | Admitting: Family Medicine

## 2021-10-13 DIAGNOSIS — L259 Unspecified contact dermatitis, unspecified cause: Secondary | ICD-10-CM

## 2021-10-18 ENCOUNTER — Other Ambulatory Visit: Payer: Self-pay | Admitting: Family Medicine

## 2021-10-28 NOTE — Progress Notes (Deleted)
° ° °  SUBJECTIVE:   CHIEF COMPLAINT / HPI:   Dysuria:  mammogram  PERTINENT  PMH / PSH: ***  OBJECTIVE:   There were no vitals taken for this visit.  ***  ASSESSMENT/PLAN:   No problem-specific Assessment & Plan notes found for this encounter.     Gladys Damme, MD [place]

## 2021-10-29 ENCOUNTER — Other Ambulatory Visit: Payer: Self-pay

## 2021-10-29 ENCOUNTER — Ambulatory Visit (INDEPENDENT_AMBULATORY_CARE_PROVIDER_SITE_OTHER): Payer: 59 | Admitting: Family Medicine

## 2021-10-29 VITALS — BP 143/99 | HR 80 | Ht 67.5 in | Wt 270.4 lb

## 2021-10-29 DIAGNOSIS — N3090 Cystitis, unspecified without hematuria: Secondary | ICD-10-CM

## 2021-10-29 DIAGNOSIS — R300 Dysuria: Secondary | ICD-10-CM | POA: Diagnosis not present

## 2021-10-29 LAB — POCT URINALYSIS DIP (MANUAL ENTRY)
Bilirubin, UA: NEGATIVE
Glucose, UA: NEGATIVE mg/dL
Ketones, POC UA: NEGATIVE mg/dL
Nitrite, UA: POSITIVE — AB
Protein Ur, POC: NEGATIVE mg/dL
Spec Grav, UA: 1.025 (ref 1.010–1.025)
Urobilinogen, UA: 0.2 E.U./dL
pH, UA: 6 (ref 5.0–8.0)

## 2021-10-29 LAB — POCT UA - MICROSCOPIC ONLY: Epithelial cells, urine per micros: >20

## 2021-10-29 MED ORDER — NITROFURANTOIN MONOHYD MACRO 100 MG PO CAPS: 100.0000 mg | ORAL_CAPSULE | Freq: Two times a day (BID) | ORAL | 0 refills | Status: AC

## 2021-10-29 NOTE — Progress Notes (Signed)
° ° °  SUBJECTIVE:   CHIEF COMPLAINT / HPI:   Summer Brewer is a 51 y.o. female who complains of urinary frequency, urgency and mild dysuria x about 1 month, without flank pain, fever, chills, or abnormal vaginal discharge. Feels like odor is very strong like an "old person's urine"  Was seen about a month ago for UTI treated with Keflex but symptoms still lingering. Does endorse heavy bleeding which she has an endometrial ablation scheduled next month. No vaginal symptoms, new partners or concern for STIs.    OBJECTIVE:   BP (!) 143/99    Pulse 80    Wt 270 lb 6.4 oz (122.7 kg)    SpO2 100%    BMI 41.73 kg/m    Physical exam  General: well appearing, NAD Cardiovascular: RRR, no murmurs Lungs: CTAB. Normal WOB Abdomen: soft, non-distended, non-tender No CVA tenderness  Skin: warm, dry. No edema  ASSESSMENT/PLAN:   No problem-specific Assessment & Plan notes found for this encounter.   Acute simple cystitis  Patient presents with 1 month of dysuria, urgency, frequency no improve with Keflex about 1 month ago. UA performed showing positive nitrites, trace leukocytes. Exam reassuring against pyelonephritis. Obtaining urine culture and treating with Macrobid 100 mg twice daily for 5 days. Will call patient after culture results if we need to change abx.    [place]

## 2021-10-29 NOTE — Patient Instructions (Addendum)
It was great seeing you today!  You came in for concern for concern of UTI . We checked your urine which does show infection.  We are good to treat with an antibiotic different from last time called Macrobid to take twice a day for 5 days.  I am also checking your urine culture to make sure that we have you on an antibiotic that will cover your infection.  If we have to change anything I will give you a call.  Feel free to call with any questions or concerns at any time, at [phone].   Take care,  Dr. Shary Key [facility]

## 2021-11-01 ENCOUNTER — Encounter: Payer: Self-pay | Admitting: Family Medicine

## 2021-11-01 ENCOUNTER — Encounter: Payer: Self-pay | Admitting: Obstetrics and Gynecology

## 2021-11-01 LAB — URINE CULTURE

## 2021-11-04 ENCOUNTER — Encounter: Payer: Self-pay | Admitting: Obstetrics and Gynecology

## 2021-11-04 NOTE — Progress Notes (Signed)
° ° °  SUBJECTIVE:   CHIEF COMPLAINT / HPI:   Dysuria Patient had a recent UTI and Ucx grew E coli>100,000 colonies on 2/21. Was treated with macrobid course and continues to have some symptoms of lower abdominal pressure and increased urinary frequency with intermittent dysuria.  Does have history of abnormal uterine bleeding which she is followed by OB/GYN. Has had another UTI 1 month ago that was treated with Keflex.  Discussed with patient options of wet prep and STD/STI Testing and Patient currently not wanting this done at this visit given had a recent wet prep and she feels it is different than her experiences with BV/Yeast.  Denies any nausea/vomiting, severe abdominal pain or back pain, or fevers.  Denies any constipation or vaginal irritation.  PERTINENT  PMH / PSH: obesity, gerd, uti  OBJECTIVE:   BP (!) 130/50    Pulse 89    Ht 5\' 7"  (1.702 m)    Wt 274 lb 2 oz (124.3 kg)    LMP 10/22/2021    SpO2 100%    BMI 42.93 kg/m    General: NAD, pleasant, able to participate in exam Respiratory: Normal effort, no obvious respiratory distress Abdomen: No suprapubic tenderness, no CVA tenderness, soft, nondistended   ASSESSMENT/PLAN:   No problem-specific Assessment & Plan notes found for this encounter. UTI symptoms - Plan: POCT urinalysis dipstick, Urine Culture, POCT UA - Microscopic Only, phenazopyridine (PYRIDIUM) 95 MG tablet UA was negative for infection today.  Will follow-up urine culture.  Recommended patient to be on women's health probiotic and increase fermented foods to help with vaginal flora.  Discussed return precautions for pyelonephritis symptoms.  Also prescribed Pyridium to help with any dysuria.  Gerrit Heck, MD [place]

## 2021-11-05 ENCOUNTER — Encounter: Payer: Self-pay | Admitting: Student

## 2021-11-05 ENCOUNTER — Ambulatory Visit (INDEPENDENT_AMBULATORY_CARE_PROVIDER_SITE_OTHER): Payer: 59 | Admitting: Student

## 2021-11-05 ENCOUNTER — Other Ambulatory Visit: Payer: Self-pay

## 2021-11-05 VITALS — BP 132/82 | HR 89 | Ht 67.0 in | Wt 274.1 lb

## 2021-11-05 DIAGNOSIS — R399 Unspecified symptoms and signs involving the genitourinary system: Secondary | ICD-10-CM | POA: Diagnosis not present

## 2021-11-05 LAB — POCT URINALYSIS DIP (MANUAL ENTRY)
Bilirubin, UA: NEGATIVE
Glucose, UA: NEGATIVE mg/dL
Ketones, POC UA: NEGATIVE mg/dL
Leukocytes, UA: NEGATIVE
Nitrite, UA: NEGATIVE
Protein Ur, POC: NEGATIVE mg/dL
Spec Grav, UA: 1.015 (ref 1.010–1.025)
Urobilinogen, UA: 1 E.U./dL
pH, UA: 6.5 (ref 5.0–8.0)

## 2021-11-05 LAB — POCT UA - MICROSCOPIC ONLY

## 2021-11-05 MED ORDER — PHENAZOPYRIDINE HCL 95 MG PO TABS: 95.0000 mg | ORAL_TABLET | Freq: Three times a day (TID) | ORAL | 0 refills | Status: DC | PRN

## 2021-11-05 NOTE — Patient Instructions (Signed)
It was great to see you! Thank you for allowing me to participate in your care!   I recommend that you always bring your medications to each appointment as this makes it easy to ensure we are on the correct medications and helps [place] not miss when refills are needed.  Our plans for today:  -Your urine analysis was negative for infection today, we will follow up your uyrine culture and let you know if that grows anything -Please return to care if you start developing bad back pain/abdominal pain, nausea/vomiting, and fevers -I would recommend taking a probiotic daily as well and increasing foods that are fermented (yogurt, kimchi etc.)  Take care and seek immediate care sooner if you develop any concerns. Please remember to show up 15 minutes before your scheduled appointment time!  Gerrit Heck, MD [place]

## 2021-11-07 ENCOUNTER — Encounter: Payer: Self-pay | Admitting: Student

## 2021-11-07 LAB — URINE CULTURE

## 2021-11-10 ENCOUNTER — Other Ambulatory Visit: Payer: Self-pay | Admitting: Family Medicine

## 2021-11-10 DIAGNOSIS — G43109 Migraine with aura, not intractable, without status migrainosus: Secondary | ICD-10-CM

## 2021-11-11 MED ORDER — METOCLOPRAMIDE HCL 10 MG PO TABS: 10.0000 mg | ORAL_TABLET | Freq: Four times a day (QID) | ORAL | 0 refills | Status: DC | PRN

## 2021-11-13 ENCOUNTER — Other Ambulatory Visit: Payer: Self-pay

## 2021-11-13 ENCOUNTER — Encounter (HOSPITAL_BASED_OUTPATIENT_CLINIC_OR_DEPARTMENT_OTHER): Payer: Self-pay | Admitting: Obstetrics and Gynecology

## 2021-11-13 NOTE — Progress Notes (Signed)
Spoke w/ via phone for pre-op interview: patient  ?Lab needs dos: EKG, I-Stat, UPT, T&S ?Lab results: NA ?COVID test: patient states asymptomatic no test needed. ?Arrive at 11 AM ?NPO after MN except clear liquids.Clear liquids from MN until 10 AM ?Med rec completed. ?Medications to take morning of surgery: Pepcid, Flonase, Prozac, Sylnd, and Amlodipine; Relpax PRN. ?Diabetic medication: NA ?Patient instructed no nail polish to be worn day of surgery. ?Patient instructed to bring photo id and insurance card day of surgery. ?Patient aware to have Driver (ride ) / caregiver for 24 hours after surgery. (Daughter do drive.) ?Patient Special Instructions: NA ?Pre-Op special Istructions: NA ?Patient verbalized understanding of instructions that were given at this phone interview. ?Patient denies shortness of breath, chest pain, fever, cough at this phone interview.  ?

## 2021-11-14 ENCOUNTER — Ambulatory Visit
Admission: RE | Admit: 2021-11-14 | Discharge: 2021-11-14 | Disposition: A | Discharge status: Home / Self Care | Payer: 59 | Source: Ambulatory Visit | Attending: Obstetrics and Gynecology | Admitting: Obstetrics and Gynecology

## 2021-11-14 DIAGNOSIS — Z1231 Encounter for screening mammogram for malignant neoplasm of breast: Secondary | ICD-10-CM | POA: Diagnosis not present

## 2021-11-14 DIAGNOSIS — Z01411 Encounter for gynecological examination (general) (routine) with abnormal findings: Secondary | ICD-10-CM

## 2021-11-19 ENCOUNTER — Encounter (HOSPITAL_BASED_OUTPATIENT_CLINIC_OR_DEPARTMENT_OTHER)
Admission: RE | Disposition: A | Discharge status: Home / Self Care | Payer: Self-pay | Source: Home / Self Care | Attending: Obstetrics and Gynecology

## 2021-11-19 ENCOUNTER — Other Ambulatory Visit: Payer: Self-pay

## 2021-11-19 ENCOUNTER — Ambulatory Visit (HOSPITAL_BASED_OUTPATIENT_CLINIC_OR_DEPARTMENT_OTHER): Payer: 59 | Admitting: Anesthesiology

## 2021-11-19 ENCOUNTER — Encounter (HOSPITAL_BASED_OUTPATIENT_CLINIC_OR_DEPARTMENT_OTHER): Payer: Self-pay | Admitting: Obstetrics and Gynecology

## 2021-11-19 ENCOUNTER — Ambulatory Visit (HOSPITAL_BASED_OUTPATIENT_CLINIC_OR_DEPARTMENT_OTHER)
Admission: RE | Admit: 2021-11-19 | Discharge: 2021-11-19 | Disposition: A | Discharge status: Home / Self Care | Payer: 59 | Attending: Obstetrics and Gynecology | Admitting: Obstetrics and Gynecology

## 2021-11-19 DIAGNOSIS — N939 Abnormal uterine and vaginal bleeding, unspecified: Secondary | ICD-10-CM | POA: Diagnosis not present

## 2021-11-19 DIAGNOSIS — N920 Excessive and frequent menstruation with regular cycle: Secondary | ICD-10-CM | POA: Diagnosis not present

## 2021-11-19 LAB — ABO/RH: ABO/RH(D): O POS

## 2021-11-19 LAB — TYPE AND SCREEN
ABO/RH(D): O POS
Antibody Screen: NEGATIVE

## 2021-11-19 LAB — POCT I-STAT, CHEM 8
BUN: 10 mg/dL (ref 6–20)
Calcium, Ion: 1.23 mmol/L (ref 1.15–1.40)
Chloride: 109 mmol/L (ref 98–111)
Creatinine, Ser: 1.2 mg/dL — ABNORMAL HIGH (ref 0.44–1.00)
Glucose, Bld: 97 mg/dL (ref 70–99)
HCT: 40 % (ref 36.0–46.0)
Hemoglobin: 13.6 g/dL (ref 12.0–15.0)
Potassium: 3.9 mmol/L (ref 3.5–5.1)
Sodium: 145 mmol/L (ref 135–145)
TCO2: 25 mmol/L (ref 22–32)

## 2021-11-19 LAB — POCT PREGNANCY, URINE: Preg Test, Ur: NEGATIVE

## 2021-11-19 SURGERY — DILATATION & CURETTAGE/HYSTEROSCOPY WITH HYDROTHERMAL ABLATION
Anesthesia: General | Site: Vagina

## 2021-11-19 MED ORDER — IBUPROFEN 600 MG PO TABS: 600.0000 mg | ORAL_TABLET | Freq: Four times a day (QID) | ORAL | 3 refills | Status: DC | PRN

## 2021-11-19 MED ORDER — LIDOCAINE 2% (20 MG/ML) 5 ML SYRINGE
INTRAMUSCULAR | Status: DC | PRN
Start: 2021-11-19 — End: 2021-11-19
  Administered 2021-11-19: 60 mg via INTRAVENOUS

## 2021-11-19 MED ORDER — SODIUM CHLORIDE 0.9 % IR SOLN
Status: DC | PRN
  Administered 2021-11-19: 3000 mL

## 2021-11-19 MED ORDER — DEXAMETHASONE SODIUM PHOSPHATE 10 MG/ML IJ SOLN
INTRAMUSCULAR | Status: AC
  Filled 2021-11-19: qty 1

## 2021-11-19 MED ORDER — LACTATED RINGERS IV SOLN: INTRAVENOUS | Status: DC

## 2021-11-19 MED ORDER — CHLOROPROCAINE HCL 1 % IJ SOLN
INTRAMUSCULAR | Status: DC | PRN
  Administered 2021-11-19: 10 mL

## 2021-11-19 MED ORDER — OXYCODONE HCL 5 MG PO TABS
ORAL_TABLET | ORAL | Status: AC
  Filled 2021-11-19: qty 1

## 2021-11-19 MED ORDER — KETOROLAC TROMETHAMINE 30 MG/ML IJ SOLN
INTRAMUSCULAR | Status: DC | PRN
  Administered 2021-11-19: 30 mg via INTRAVENOUS

## 2021-11-19 MED ORDER — LABETALOL HCL 5 MG/ML IV SOLN
5.0000 mg | INTRAVENOUS | Status: AC | PRN
  Administered 2021-11-19 (×2): 5 mg via INTRAVENOUS

## 2021-11-19 MED ORDER — ONDANSETRON HCL 4 MG/2ML IJ SOLN
INTRAMUSCULAR | Status: AC
  Filled 2021-11-19: qty 2

## 2021-11-19 MED ORDER — OXYCODONE HCL 5 MG PO TABS
5.0000 mg | ORAL_TABLET | Freq: Once | ORAL | Status: AC
  Administered 2021-11-19: 5 mg via ORAL

## 2021-11-19 MED ORDER — FENTANYL CITRATE (PF) 100 MCG/2ML IJ SOLN
INTRAMUSCULAR | Status: AC
Start: 2021-11-19 — End: ?
  Filled 2021-11-19: qty 2

## 2021-11-19 MED ORDER — LIDOCAINE HCL (PF) 2 % IJ SOLN
INTRAMUSCULAR | Status: AC
  Filled 2021-11-19: qty 5

## 2021-11-19 MED ORDER — DEXAMETHASONE SODIUM PHOSPHATE 4 MG/ML IJ SOLN
INTRAMUSCULAR | Status: DC | PRN
Start: 2021-11-19 — End: 2021-11-19
  Administered 2021-11-19: 10 mg via INTRAVENOUS

## 2021-11-19 MED ORDER — ONDANSETRON HCL 4 MG/2ML IJ SOLN
INTRAMUSCULAR | Status: DC | PRN
  Administered 2021-11-19: 4 mg via INTRAVENOUS

## 2021-11-19 MED ORDER — OXYCODONE-ACETAMINOPHEN 5-325 MG PO TABS: 1.0000 | ORAL_TABLET | Freq: Four times a day (QID) | ORAL | 0 refills | Status: DC | PRN

## 2021-11-19 MED ORDER — FENTANYL CITRATE (PF) 100 MCG/2ML IJ SOLN
INTRAMUSCULAR | Status: DC | PRN
Start: 2021-11-19 — End: 2021-11-19
  Administered 2021-11-19 (×2): 50 ug via INTRAVENOUS
  Administered 2021-11-19 (×2): 25 ug via INTRAVENOUS
  Administered 2021-11-19: 50 ug via INTRAVENOUS

## 2021-11-19 MED ORDER — PROPOFOL 10 MG/ML IV BOLUS
INTRAVENOUS | Status: AC
  Filled 2021-11-19: qty 20

## 2021-11-19 MED ORDER — PROPOFOL 10 MG/ML IV BOLUS
INTRAVENOUS | Status: DC | PRN
  Administered 2021-11-19: 200 mg via INTRAVENOUS

## 2021-11-19 MED ORDER — FENTANYL CITRATE (PF) 100 MCG/2ML IJ SOLN
25.0000 ug | INTRAMUSCULAR | Status: DC | PRN
  Administered 2021-11-19 (×2): 50 ug via INTRAVENOUS

## 2021-11-19 MED ORDER — POVIDONE-IODINE 10 % EX SWAB
2.0000 | Freq: Once | CUTANEOUS | Status: DC
Start: 2021-11-19 — End: 2021-11-19

## 2021-11-19 MED ORDER — 0.9 % SODIUM CHLORIDE (POUR BTL) OPTIME
TOPICAL | Status: DC | PRN
  Administered 2021-11-19: 500 mL

## 2021-11-19 MED ORDER — KETOROLAC TROMETHAMINE 30 MG/ML IJ SOLN
INTRAMUSCULAR | Status: AC
  Filled 2021-11-19: qty 1

## 2021-11-19 MED ORDER — MIDAZOLAM HCL 2 MG/2ML IJ SOLN
INTRAMUSCULAR | Status: AC
  Filled 2021-11-19: qty 2

## 2021-11-19 MED ORDER — MIDAZOLAM HCL 5 MG/5ML IJ SOLN
INTRAMUSCULAR | Status: DC | PRN
  Administered 2021-11-19: 2 mg via INTRAVENOUS

## 2021-11-19 MED ORDER — LABETALOL HCL 5 MG/ML IV SOLN
INTRAVENOUS | Status: AC
  Filled 2021-11-19: qty 4

## 2021-11-19 SURGICAL SUPPLY — 18 items
DRSG TELFA 3X8 NADH (GAUZE/BANDAGES/DRESSINGS) ×2 IMPLANT
GAUZE 4X4 16PLY ~~LOC~~+RFID DBL (SPONGE) ×3 IMPLANT
GLOVE SURG POLYISO LF SZ6.5 (GLOVE) ×3 IMPLANT
GLOVE SURG POLYISO LF SZ8.5 (GLOVE) ×1 IMPLANT
GLOVE SURG UNDER POLY LF SZ6.5 (GLOVE) ×5 IMPLANT
GLOVE SURG UNDER POLY LF SZ7 (GLOVE) ×1 IMPLANT
GLOVE SURG UNDER POLY LF SZ8.5 (GLOVE) ×1 IMPLANT
GOWN STRL REUS W/TWL 2XL LVL3 (GOWN DISPOSABLE) ×1 IMPLANT
GOWN STRL REUS W/TWL LRG LVL3 (GOWN DISPOSABLE) ×7 IMPLANT
IV NS IRRIG 3000ML ARTHROMATIC (IV SOLUTION) ×1 IMPLANT
KIT TURNOVER CYSTO (KITS) ×3 IMPLANT
NS IRRIG 500ML POUR BTL (IV SOLUTION) ×1 IMPLANT
PACK VAGINAL MINOR WOMEN LF (CUSTOM PROCEDURE TRAY) ×3 IMPLANT
PAD DRESSING TELFA 3X8 NADH (GAUZE/BANDAGES/DRESSINGS) ×2 IMPLANT
PAD OB MATERNITY 4.3X12.25 (PERSONAL CARE ITEMS) ×3 IMPLANT
PAD PREP 24X48 CUFFED NSTRL (MISCELLANEOUS) ×3 IMPLANT
SET GENESYS HTA PROCERVA (MISCELLANEOUS) ×3 IMPLANT
SOL PREP POV-IOD 4OZ 10% (MISCELLANEOUS) ×1 IMPLANT

## 2021-11-19 NOTE — Transfer of Care (Signed)
Immediate Anesthesia Transfer of Care Note ? ?Patient: Summer Brewer ? ?Procedure(s) Performed: Procedure(s) (LRB): ?HYSTEROSCOPY WITH HYDROTHERMAL ABLATION (N/A) ? ?Patient Location: PACU ? ?Anesthesia Type: General ? ?Level of Consciousness: awake, sedated, patient cooperative and responds to stimulation ? ?Airway & Oxygen Therapy: Patient Spontanous Breathing and Patient connected to [place] 02 w/ soft FM ? ?Post-op Assessment: Report given to PACU RN, Post -op Vital signs reviewed and stable and Patient moving all extremities ? ?Post vital signs: Reviewed and stable ? ?Complications: No apparent anesthesia complications ?

## 2021-11-19 NOTE — Discharge Instructions (Signed)

## 2021-11-19 NOTE — Anesthesia Postprocedure Evaluation (Signed)
Anesthesia Post Note ? ?Patient: Summer Brewer ? ?Procedure(s) Performed: HYSTEROSCOPY WITH HYDROTHERMAL ABLATION (Vagina ) ? ?  ? ?Patient location during evaluation: PACU ?Anesthesia Type: General ?Level of consciousness: awake ?Pain management: pain level controlled ?Vital Signs Assessment: post-procedure vital signs reviewed and stable ?Respiratory status: spontaneous breathing ?Cardiovascular status: stable ?Postop Assessment: no apparent nausea or vomiting ?Anesthetic complications: no ? ? ?No notable events documented. ? ?Last Vitals:  ?Vitals:  ? 11/19/21 1430 11/19/21 1517  ?BP: (!) 165/110 (!) 175/106  ?Pulse: 93 96  ?Resp: 13 16  ?Temp:  36.6 ?C  ?SpO2: 92% 95%  ?  ?Last Pain:  ?Vitals:  ? 11/19/21 1430  ?TempSrc:   ?PainSc: 4   ? ? ?  ?  ?  ?  ?  ?  ? ?Pinchos Topel ? ? ? ? ?

## 2021-11-19 NOTE — Anesthesia Procedure Notes (Signed)
Procedure Name: LMA Insertion ?Date/Time: 11/19/2021 12:58 PM ?Performed by: Justice Rocher, CRNA ?Pre-anesthesia Checklist: Patient identified, Emergency Drugs available, Suction available, Patient being monitored and Timeout performed ?Patient Re-evaluated:Patient Re-evaluated prior to induction ?Oxygen Delivery Method: Circle system utilized ?Preoxygenation: Pre-oxygenation with 100% oxygen ?Induction Type: IV induction ?Ventilation: Mask ventilation without difficulty ?LMA: LMA inserted ?LMA Size: 4.0 ?Number of attempts: 1 ?Airway Equipment and Method: Bite block ?Placement Confirmation: positive ETCO2, breath sounds checked- equal and bilateral and CO2 detector ?Tube secured with: Tape ?Dental Injury: Teeth and Oropharynx as per pre-operative assessment  ? ? ? ? ?

## 2021-11-19 NOTE — H&P (Signed)
Summer Brewer is an 51 y.o. female P2 with menorrhagia with regular cycle here for scheduled endometrial ablation. Patient failed medical management and declined re-insertion of Mirena IUD. Patient is without any other complaints ? ?Pertinent Gynecological History: ?Menses: flow is moderate and regular every month without intermenstrual spotting ?Contraception: tubal ligation ?DES exposure: denies ?Last mammogram: normal Date: 11/2021 ?Last pap: ASCUS neg HPV  Date: 09/2021 ? ? ?Menstrual History: ? ?No LMP recorded (lmp unknown). (Menstrual status: Irregular Periods). ?  ? ?Past Medical History:  ?Diagnosis Date  ? Anemia   ? Back pain   ? Eczema   ? Fibroid   ? GERD (gastroesophageal reflux disease)   ? Hypertension   ? Migraines   ? Preterm labor   ? Umbilical hernia   ? Vaginal Pap smear, abnormal   ? ? ?Past Surgical History:  ?Procedure Laterality Date  ? CESAREAN SECTION  2000  ? x2 and 2002  ? INSERTION OF MESH  08/09/2015  ? Procedure: INSERTION OF MESH;  Surgeon: Donnie Mesa, MD;  Location: [facility];  Service: General;;  ? NASAL POLYP EXCISION Bilateral 2016  ? pt states she had polyps removed on both sides  ? TUBAL LIGATION    ? UMBILICAL HERNIA REPAIR  08/09/2015  ? Procedure: UMBILICAL HERNIA REPAIR;  Surgeon: Donnie Mesa, MD;  Location: [facility];  Service: General;;  ? ? ?Family History  ?Problem Relation Age of Onset  ? Hypertension Mother   ? Deep vein thrombosis Mother   ? Hypertension Father   ? Cancer - Prostate Father   ? Cancer Paternal Grandmother   ? Hypertension Other   ? Deep vein thrombosis Other   ? Leukemia Other   ? Colon cancer Neg Hx   ? Colon polyps Neg Hx   ? Esophageal cancer Neg Hx   ? Stomach cancer Neg Hx   ? Rectal cancer Neg Hx   ? ? ?Social History:  reports that she has never smoked. She has never used smokeless tobacco. She reports that she does not drink alcohol and does not use drugs. ? ?Allergies:  ?Allergies  ?Allergen Reactions  ? Latex Hives and Itching   ? ? ?Medications Prior to Admission  ?Medication Sig Dispense Refill Last Dose  ? amLODipine (NORVASC) 10 MG tablet TAKE 1 TABLET BY MOUTH EVERY DAY 90 tablet 1 11/18/2021  ? atorvastatin (LIPITOR) 40 MG tablet Take 1 tablet (40 mg total) by mouth daily. (Patient taking differently: Take 40 mg by mouth daily. Patient not taking) 90 tablet 3 Past Month  ? Drospirenone (SLYND) 4 MG TABS Take 4 mg by mouth daily. 84 tablet 4 11/18/2021  ? eletriptan (RELPAX) 20 MG tablet TAKE 1 TAB BY MOUTH EVERY DAY AS NEEDED MIGRAINE OR HEADACHE. MAY REPEAT IN 2HRS IF PERSISTS/RECURS 12 tablet 1 Past Month  ? famotidine (PEPCID) 20 MG tablet Take 1 tablet (20 mg total) by mouth 2 (two) times daily as needed for heartburn or indigestion. 180 tablet 1 11/18/2021  ? FLUoxetine (PROZAC) 20 MG capsule TAKE 1 CAPSULE BY MOUTH EVERY DAY 90 capsule 1 11/19/2021  ? fluticasone (FLONASE) 50 MCG/ACT nasal spray Place 2 sprays into both nostrils daily. Takes as needed 48 mL 2 11/18/2021  ? hydrochlorothiazide (HYDRODIURIL) 25 MG tablet Take 1 tablet (25 mg total) by mouth daily. 90 tablet 3 11/18/2021  ? megestrol (MEGACE) 40 MG tablet Take 1 tablet (40 mg total) by mouth daily. 30 tablet 4 11/13/2021  ? metoCLOPramide (REGLAN) 10  MG tablet Take 1 tablet (10 mg total) by mouth every 6 (six) hours as needed for nausea. 10 tablet 0 Past Month  ? ondansetron (ZOFRAN) 4 MG tablet Take 1 tablet (4 mg total) by mouth as needed for up to 2 doses for nausea or vomiting. Take one tablet 30 to 60 minutes before each colonoscopy prep dose to prevent nausea or vomiting. 2 tablet 0 Past Month  ? triamcinolone ointment (KENALOG) 0.5 % APPLY TO AFFECTED AREA TWICE A DAY 30 g 0 Past Month  ? hydrOXYzine (ATARAX) 10 MG tablet TAKE 0.5 TABLETS BY MOUTH AT BEDTIME. 45 tablet 1   ? phenazopyridine (PYRIDIUM) 95 MG tablet Take 1 tablet (95 mg total) by mouth 3 (three) times daily as needed for pain. 60 tablet 0   ? propranolol ER (INDERAL LA) 60 MG 24 hr capsule TAKE 1  CAPSULE BY MOUTH EVERY DAY 90 capsule 0   ? ? ?Review of Systems ?See pertinent in HPI. All other systems reviewed and non contributory ?Blood pressure (!) 195/115, pulse (!) 108, temperature 99.5 ?F (37.5 ?C), temperature source Oral, resp. rate 20, height '5\' 7"'$  (1.702 m), weight 79.6 kg, SpO2 100 %. ?Physical Exam ?GENERAL: Well-developed, well-nourished female in no acute distress.  ?HEENT: Normocephalic, atraumatic. Sclerae anicteric.  ?NECK: Supple. Normal thyroid.  ?LUNGS: Clear to auscultation bilaterally.  ?HEART: Regular rate and rhythm. ?ABDOMEN: Soft, nontender, nondistended. No organomegaly. ?PELVIC: Deferred to OR ?EXTREMITIES: No cyanosis, clubbing, or edema, 2+ distal pulses. ? ?Results for orders placed or performed during the hospital encounter of 11/19/21 (from the past 24 hour(s))  ?Pregnancy, urine POC     Status: None  ? Collection Time: 11/19/21 11:20 AM  ?Result Value Ref Range  ? Preg Test, Ur NEGATIVE NEGATIVE  ?I-STAT, chem 8     Status: Abnormal  ? Collection Time: 11/19/21 12:25 PM  ?Result Value Ref Range  ? Sodium 145 135 - 145 mmol/L  ? Potassium 3.9 3.5 - 5.1 mmol/L  ? Chloride 109 98 - 111 mmol/L  ? BUN 10 6 - 20 mg/dL  ? Creatinine, Ser 1.20 (H) 0.44 - 1.00 mg/dL  ? Glucose, Bld 97 70 - 99 mg/dL  ? Calcium, Ion 1.23 1.15 - 1.40 mmol/L  ? TCO2 25 22 - 32 mmol/L  ? Hemoglobin 13.6 12.0 - 15.0 g/dL  ? HCT 40.0 36.0 - 46.0 %  ? ? ?No results found. ?09/2021 Endometrial biopsy  ?- negative for hyperplasia or malignancy ?- polypoid fragment ? ?Assessment/Plan: ?51 yo with menorrhagia with regular cycles here for endometrial ablation ?- Risks, benefits and alternatives were explained including but not limited to risks of bleeding, infection, uterine perforation and damage to adjacent organs. Patient verbalized understanding and all questions were answered ? ?Summer Brewer ?11/19/2021, 12:30 PM ? ?

## 2021-11-19 NOTE — Op Note (Signed)
PREOPERATIVE DIAGNOSIS:  Abnormal uterine bleeding ?POSTOPERATIVE DIAGNOSIS: The same ?PROCEDURE: Hysteroscopy, Dilation and Curettage,  Hydrothermal Endometrial Ablation ?SURGEON:  Dr. Mora Bellman ? ?INDICATIONS: 51 y.o. [id number] here for scheduled surgery for abnormal uterine bleeding. Risks of surgery were discussed with the patient including but not limited to: bleeding; infection which may require antibiotics; injury to uterus leading to risk of injury to surrounding intraperitoneal organs, burn injury to vagina or other organs, need for additional procedures including laparoscopy or laparotomy, inability to complete ablation due to uterine or mechanical anomaly, and other postoperative/anesthesia complications.  Patient was informed that there is a high likelihood of success of controlling her symptoms; however about 5% of patients may require further intervention.  Written informed consent was obtained.   ? ?FINDINGS:  A 9 week size uterus.  Diffuse proliferative endometrium.  Normal ostia bilaterally. ? ?ANESTHESIA:   General, paracervical block. ?INTRAVENOUS FLUIDS:  1000 ml of LR ?ESTIMATED BLOOD LOSS:  10 ml ?SPECIMENS: none ?COMPLICATIONS:  None immediate. ? ?PROCEDURE DETAILS:  The patient was then taken to the operating room where general anesthesia was administered and was found to be adequate.  After an adequate timeout was performed, she was placed in the dorsal lithotomy position and examined; then prepped and draped in the sterile manner. A speculum was then placed in the patient's vagina and a single tooth tenaculum was applied to the anterior lip of the cervix.   A paracervical block using 30 ml of 0.5% Marcaine was administered.  The cervix was sounded to 9 cm and dilated manually with metal dilators to accommodate the hydrothermal ablation hysteroscopic apparatus.  The hysteroscope was inserted under direct visualization using normal saline as a suspension medium.  The uterine cavity was  carefully examined, both ostia were recognized, and diffusely proliferative endometrium was noted.   The hydrothermal ablation was then carried out as per protocol.   Complete ablation of the endometrium was observed and the hysteroscope was removed under direct visualization.  No complications were observed.  The tenaculum was removed from the anterior lip of the cervix, and the vaginal speculum was removed after noting good hemostasis.  The patient tolerated the procedure well and was taken to the recovery area awake, extubated and in stable condition. ? ?The patient will be discharged to home as per PACU criteria.  Routine postoperative instructions given.  She was prescribed Percocet, Ibuprofen and Colace. ? ?

## 2021-11-19 NOTE — Anesthesia Preprocedure Evaluation (Addendum)
Anesthesia Evaluation  ?Patient identified by MRN, date of birth, ID band ?Patient awake ? ? ? ?Reviewed: ?Allergy & Precautions, NPO status , Patient's Chart, lab work & pertinent test results ? ?Airway ?Mallampati: III ? ? ? ? ? ? Dental ?no notable dental hx. ? ?  ?Pulmonary ?neg pulmonary ROS,  ?  ?Pulmonary exam normal ? ? ? ? ? ? ? Cardiovascular ?hypertension, Pt. on medications ?Normal cardiovascular exam ? ? ?  ?Neuro/Psych ? Headaches, PSYCHIATRIC DISORDERS Anxiety   ? GI/Hepatic ?GERD  Medicated,  ?Endo/Other  ? ? Renal/GU ?  ?negative genitourinary ?  ?Musculoskeletal ?negative musculoskeletal ROS ?(+)  ? Abdominal ?Normal abdominal exam  (+)   ?Peds ? Hematology ?  ?Anesthesia Other Findings ? ? Reproductive/Obstetrics ? ?  ? ? ? ? ? ? ? ? ? ? ? ? ? ?  ?  ? ? ? ? ? ? ? ?Anesthesia Physical ?Anesthesia Plan ? ?ASA: 2 ? ?Anesthesia Plan: General  ? ?Post-op Pain Management: Dilaudid IV  ? ?Induction: Intravenous ? ?PONV Risk Score and Plan: 4 or greater and Ondansetron, Midazolam and Dexamethasone ? ?Airway Management Planned: Oral ETT ? ?Additional Equipment: None ? ?Intra-op Plan:  ? ?Post-operative Plan: Extubation in OR ? ?Informed Consent: I have reviewed the patients History and Physical, chart, labs and discussed the procedure including the risks, benefits and alternatives for the proposed anesthesia with the patient or authorized representative who has indicated his/her understanding and acceptance.  ? ? ? ?Dental advisory given ? ?Plan Discussed with: CRNA ? ?Anesthesia Plan Comments:   ? ? ? ? ? ? ?Anesthesia Quick Evaluation ? ?

## 2021-11-20 ENCOUNTER — Encounter (HOSPITAL_BASED_OUTPATIENT_CLINIC_OR_DEPARTMENT_OTHER): Payer: Self-pay | Admitting: Obstetrics and Gynecology

## 2021-11-20 ENCOUNTER — Encounter: Payer: Self-pay | Admitting: Family Medicine

## 2021-11-25 ENCOUNTER — Other Ambulatory Visit: Payer: Self-pay | Admitting: Family Medicine

## 2021-11-25 ENCOUNTER — Encounter: Payer: Self-pay | Admitting: Obstetrics and Gynecology

## 2021-11-25 DIAGNOSIS — L259 Unspecified contact dermatitis, unspecified cause: Secondary | ICD-10-CM

## 2021-11-25 NOTE — Telephone Encounter (Signed)
Normal post opt discomfort. She should be scheduled to se eme next week correct?

## 2021-11-26 ENCOUNTER — Other Ambulatory Visit: Payer: Self-pay | Admitting: Obstetrics and Gynecology

## 2021-11-26 MED ORDER — OXYCODONE-ACETAMINOPHEN 5-325 MG PO TABS: 1.0000 | ORAL_TABLET | Freq: Four times a day (QID) | ORAL | 0 refills | Status: DC | PRN

## 2021-11-27 ENCOUNTER — Ambulatory Visit: Payer: 59 | Admitting: Family Medicine

## 2021-11-28 ENCOUNTER — Other Ambulatory Visit: Payer: Self-pay

## 2021-11-28 ENCOUNTER — Ambulatory Visit (INDEPENDENT_AMBULATORY_CARE_PROVIDER_SITE_OTHER): Payer: 59 | Admitting: Family Medicine

## 2021-11-28 VITALS — BP 153/101 | HR 93

## 2021-11-28 DIAGNOSIS — I10 Essential (primary) hypertension: Secondary | ICD-10-CM | POA: Diagnosis not present

## 2021-11-28 DIAGNOSIS — K1370 Unspecified lesions of oral mucosa: Secondary | ICD-10-CM | POA: Diagnosis not present

## 2021-11-28 MED ORDER — METOPROLOL TARTRATE 50 MG PO TABS: 50.0000 mg | ORAL_TABLET | Freq: Two times a day (BID) | ORAL | 3 refills | Status: DC

## 2021-11-28 NOTE — Patient Instructions (Addendum)
It was wonderful to see you today. ? ?Please bring ALL of your medications with you to every visit.  ? ?Today we talked about: ? ?Starting metoprolol for your blood pressure.  Follow-up in 2 weeks. ? ?Looks like you have a canker sore on the roof of your mouth.  This will likely heal on its own in the next two weeks.  If it continues to be sore you can attempt to use Chloraseptic spray.  If this fails to resolve please follow-up. ? ?Please be sure to schedule follow up at the front  desk before you leave today.  ? ?If you haven't already, sign up for My Chart to have easy access to your labs results, and communication with your primary care physician. ? ?Please call the clinic at [phone] if your symptoms worsen or you have any concerns. It was our pleasure to serve you. ? ?Dr. Janus Molder ? ?

## 2021-11-28 NOTE — Progress Notes (Signed)
? ? ?  SUBJECTIVE:  ? ?CHIEF COMPLAINT / HPI:  ? ?Hypertension: ?- Medications: Amlodipine 10 mg daily and  HCTZ 25 mg daily ?- Compliance: Yes ?- Denies any SOB, CP, vision changes, LE edema, medication SEs, or symptoms of hypotension ?-Endorses headaches; hx of migraines. Denies emesis. ? ?Mouth lesion ?Noticed a sore on the roof of her mouth 1 week ago. It is painful and is somewhat affecting eating. She denies fever, night sweats and weight loss.  ? ?PERTINENT  PMH / PSH: s/p ablation 3/14 for heavy menses ? ?OBJECTIVE:  ? ?BP (!) 153/101   Pulse 93   LMP  (LMP Unknown) Comment: Patient doesn't know start of last menstural due to continuous bleeding. ?General: Appears well, no acute distress. Age appropriate. 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 See picture below.  ?Cardiac: RRR, normal heart sounds, no murmurs ?Respiratory: CTAB, normal effort ?Extremities: No edema or cyanosis. ?Skin: Warm and dry, no rashes noted ?Neuro: alert and oriented, no focal deficits ?Psych: normal affect ? ?  ?Media Information ?Document Information ? ?Photos  ?  ?11/28/2021 13:49  ?Attached To:  ?Office Visit on 11/28/21 with Autry-Lott, Naaman Plummer, DO  ? ?Source Information ? ?Autry-Lott, [facility] Med Resident  ? ? ?ASSESSMENT/PLAN:  ? ?HYPERTENSION, BENIGN SYSTEMIC ?Not well controlled. Will add the below with hopes of some benefit for migraine headache. Can consider carvedilol in the future for better BP control if metoprolol is not optimal. Follow up in 2 weeks.   ?- Basic Metabolic Panel ?- metoprolol tartrate (LOPRESSOR) 50 MG tablet; Take 1 tablet (50 mg total) by mouth 2 (two) times daily.  Dispense: 180 tablet; Refill: 3 ? ?Mouth lesion ?Worsening x1 week. 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 Discussed chloraseptic spray for relief. Should follow up if fails to improve.  ? ?Gerlene Fee, DO ?[place]  ?

## 2021-11-29 LAB — BASIC METABOLIC PANEL
BUN/Creatinine Ratio: 19 (ref 9–23)
BUN: 22 mg/dL (ref 6–24)
CO2: 26 mmol/L (ref 20–29)
Calcium: 9.7 mg/dL (ref 8.7–10.2)
Chloride: 102 mmol/L (ref 96–106)
Creatinine, Ser: 1.17 mg/dL — ABNORMAL HIGH (ref 0.57–1.00)
Glucose: 91 mg/dL (ref 70–99)
Potassium: 3.4 mmol/L — ABNORMAL LOW (ref 3.5–5.2)
Sodium: 144 mmol/L (ref 134–144)
eGFR: 58 mL/min/{1.73_m2} — ABNORMAL LOW (ref >59)

## 2021-12-02 ENCOUNTER — Other Ambulatory Visit (HOSPITAL_COMMUNITY)
Admission: RE | Admit: 2021-12-02 | Discharge: 2021-12-02 | Disposition: A | Discharge status: Home / Self Care | Payer: 59 | Source: Ambulatory Visit | Attending: Obstetrics and Gynecology | Admitting: Obstetrics and Gynecology

## 2021-12-02 ENCOUNTER — Ambulatory Visit (INDEPENDENT_AMBULATORY_CARE_PROVIDER_SITE_OTHER): Payer: 59 | Admitting: Obstetrics and Gynecology

## 2021-12-02 ENCOUNTER — Encounter: Payer: Self-pay | Admitting: Obstetrics and Gynecology

## 2021-12-02 ENCOUNTER — Other Ambulatory Visit: Payer: Self-pay

## 2021-12-02 VITALS — BP 124/82 | HR 83 | Ht 67.0 in | Wt 278.0 lb

## 2021-12-02 DIAGNOSIS — N761 Subacute and chronic vaginitis: Secondary | ICD-10-CM

## 2021-12-02 DIAGNOSIS — Z9889 Other specified postprocedural states: Secondary | ICD-10-CM

## 2021-12-02 NOTE — Progress Notes (Signed)
51 yo here for post op check following endometrial ablation on 11/19/21. Patient reports doing well with the exception of a vaginal warmness. She reports some occasional cramping pain relieved with ibuprofen. Patient reports presence of a thin yellowish discharge. Patient is without any other complaints ? ?Past Medical History:  ?Diagnosis Date  ? Anemia   ? Back pain   ? Eczema   ? Fibroid   ? GERD (gastroesophageal reflux disease)   ? Hypertension   ? Migraines   ? Preterm labor   ? Umbilical hernia   ? Vaginal Pap smear, abnormal   ? ?Past Surgical History:  ?Procedure Laterality Date  ? CESAREAN SECTION  2000  ? x2 and 2002  ? DILITATION & CURRETTAGE/HYSTROSCOPY WITH HYDROTHERMAL ABLATION N/A 11/19/2021  ? Procedure: HYSTEROSCOPY WITH HYDROTHERMAL ABLATION;  Surgeon: Mora Bellman, MD;  Location: [facility];  Service: Gynecology;  Laterality: N/A;  ? INSERTION OF MESH  08/09/2015  ? Procedure: INSERTION OF MESH;  Surgeon: Donnie Mesa, MD;  Location: [facility];  Service: General;;  ? NASAL POLYP EXCISION Bilateral 2016  ? pt states she had polyps removed on both sides  ? TUBAL LIGATION    ? UMBILICAL HERNIA REPAIR  08/09/2015  ? Procedure: UMBILICAL HERNIA REPAIR;  Surgeon: Donnie Mesa, MD;  Location: [facility];  Service: General;;  ? ?Family History  ?Problem Relation Age of Onset  ? Hypertension Mother   ? Deep vein thrombosis Mother   ? Hypertension Father   ? Cancer - Prostate Father   ? Cancer Paternal Grandmother   ? Hypertension Other   ? Deep vein thrombosis Other   ? Leukemia Other   ? Colon cancer Neg Hx   ? Colon polyps Neg Hx   ? Esophageal cancer Neg Hx   ? Stomach cancer Neg Hx   ? Rectal cancer Neg Hx   ? ?Social History  ? ?Tobacco Use  ? Smoking status: Never  ? Smokeless tobacco: Never  ?Vaping Use  ? Vaping Use: Never used  ?Substance Use Topics  ? Alcohol use: No  ? Drug use: No  ? ?ROS ?See pertinent in HPI. All other systems reviewed and non contributory ?Blood pressure 124/82,  pulse 83, height '5\' 7"'$  (1.702 m), weight 278 lb (126.1 kg). ?GENERAL: Well-developed, well-nourished female in no acute distress.  ?ABDOMEN: Soft, nontender, nondistended. No organomegaly. ?PELVIC: Normal external female genitalia. Vagina is pink and rugated.  Normal discharge. Normal appearing cervix. No adnexal mass or tenderness. Chaperone present during the pelvic exam ?EXTREMITIES: No cyanosis, clubbing, or edema, 2+ distal pulses. ? ?A/P 51 yo here for post op check following endometrial ablation ?- vaginal swab collected ?- Patient overall pleased with outcome thus far ?- Reminded patient that goal is to improve menstrual cycle and not amenorrhea ?- Patient advised to keep a menstrual calendar ?- RTC prn ?- Patient will be contacted with abnormal results ?

## 2021-12-02 NOTE — Progress Notes (Signed)
51 y.o  GYN presents for Post Op FU.  C/o warm sensation on inside of vagina. ?

## 2021-12-03 LAB — CERVICOVAGINAL ANCILLARY ONLY
Bacterial Vaginitis (gardnerella): POSITIVE — AB
Candida Glabrata: NEGATIVE
Candida Vaginitis: NEGATIVE
Comment: NEGATIVE
Comment: NEGATIVE
Comment: NEGATIVE

## 2021-12-03 MED ORDER — METRONIDAZOLE 500 MG PO TABS: 500.0000 mg | ORAL_TABLET | Freq: Two times a day (BID) | ORAL | 0 refills | Status: DC

## 2021-12-03 NOTE — Addendum Note (Signed)
Addended by: Mora Bellman on: 12/03/2021 01:48 PM ? ? Modules accepted: Orders ? ?

## 2021-12-11 NOTE — Progress Notes (Signed)
? ? ? ?  SUBJECTIVE:  ? ?CHIEF COMPLAINT / HPI:  ? ?Hypertension ?- Medications: Amlodipine 10 mg daily and  HCTZ 25 mg daily, metoprolol 50 mg daily ?- Compliance: Yes ?- Denies any SOB, CP, vision changes, LE edema, medication SEs, or symptoms of hypotension ? ?Canker sore, follow up ?Improving. Using topical numbing medication. Plans to visit dentist soon.  ? ?Right leg rash ?Hx of eczema and has a rash that is not improving with her triamcinolone. Timing of rash unknown. It is pruritic and she scratches it often. Denies changes in soaps, lotions, and detergents.  ? ?PERTINENT  PMH / PSH: Hx of migraine headaches, hx of ablation 11/19/21 ? ?OBJECTIVE:  ? ?BP 128/74   Pulse 72   Ht '5\' 7"'$  (1.702 m)   Wt 280 lb 9.6 oz (127.3 kg)   LMP  (LMP Unknown) Comment: Patient doesn't know start of last menstural due to continuous bleeding.  SpO2 97%   BMI 43.95 kg/m?   ?General: Appears well, no acute distress. Age appropriate. 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 ?Skin: Right anterior LE hyperpigmented patch with well defined boarder. Appear leathery. No surrounding erythema. ?Neuro: alert and oriented x4 ?Psych: normal affect ? ?ASSESSMENT/PLAN:  ? ?Hypertension, unspecified type ?At goal. Continue current medications.  ?- amLODipine (NORVASC) 10 MG tablet; Take 1 tablet (10 mg total) by mouth daily.  Dispense: 90 tablet; Refill: 1 ? ?Mouth lesion ?Hx of torus palatine with canker sore. Improving with topical analgesics. Plans to follow up with dentist.  ? ?Rash ?Appears chronic. Right anterior LE hyperpigmented patch. No surrounding erythema. It is pruritic and appears chronic. Consider post inflammatory hyperpigmentation. Will attempt OTC hydrocortisone cream to minimize scratching to aid in healing. Encouraged to follow up if no improvement.  ? ?Gerlene Fee, DO ?[place]  ?

## 2021-12-12 ENCOUNTER — Encounter: Payer: Self-pay | Admitting: Family Medicine

## 2021-12-12 ENCOUNTER — Ambulatory Visit (INDEPENDENT_AMBULATORY_CARE_PROVIDER_SITE_OTHER): Payer: 59 | Admitting: Family Medicine

## 2021-12-12 VITALS — BP 128/74 | HR 72 | Ht 67.0 in | Wt 280.6 lb

## 2021-12-12 DIAGNOSIS — K1370 Unspecified lesions of oral mucosa: Secondary | ICD-10-CM | POA: Diagnosis not present

## 2021-12-12 DIAGNOSIS — I10 Essential (primary) hypertension: Secondary | ICD-10-CM

## 2021-12-12 DIAGNOSIS — R21 Rash and other nonspecific skin eruption: Secondary | ICD-10-CM

## 2021-12-12 MED ORDER — AMLODIPINE BESYLATE 10 MG PO TABS: 10.0000 mg | ORAL_TABLET | Freq: Every day | ORAL | 1 refills | Status: DC

## 2021-12-12 NOTE — Patient Instructions (Addendum)
It was wonderful to see you today. ? ?Please bring ALL of your medications with you to every visit.  ? ?Today we talked about: ? ?Blood pressure-your blood pressure was wonderful today you are at goal!  Continue current medications.  He can now follow-up as needed. ?Canker sore-continue supportive therapy and get into see your dentist when able ?Right leg rash-likely inflammatory changes from scratching consider hydrocortisone cream to minimize itch and scratching to help skin heal.  Can follow-up if this is not improved. ? ?Please call the clinic at [phone] if your symptoms worsen or you have any concerns. It was our pleasure to serve you. ? ?Dr. Janus Molder ? ?

## 2021-12-24 ENCOUNTER — Other Ambulatory Visit: Payer: Self-pay | Admitting: Family Medicine

## 2022-01-14 ENCOUNTER — Encounter: Payer: Self-pay | Admitting: Family Medicine

## 2022-01-14 ENCOUNTER — Ambulatory Visit (INDEPENDENT_AMBULATORY_CARE_PROVIDER_SITE_OTHER): Payer: 59 | Admitting: Family Medicine

## 2022-01-14 VITALS — BP 158/90 | HR 87 | Ht 67.0 in | Wt 287.1 lb

## 2022-01-14 DIAGNOSIS — R5383 Other fatigue: Secondary | ICD-10-CM

## 2022-01-14 DIAGNOSIS — M545 Low back pain, unspecified: Secondary | ICD-10-CM

## 2022-01-14 DIAGNOSIS — E782 Mixed hyperlipidemia: Secondary | ICD-10-CM

## 2022-01-14 DIAGNOSIS — E785 Hyperlipidemia, unspecified: Secondary | ICD-10-CM | POA: Diagnosis not present

## 2022-01-14 DIAGNOSIS — R1013 Epigastric pain: Secondary | ICD-10-CM

## 2022-01-14 LAB — POCT GLYCOSYLATED HEMOGLOBIN (HGB A1C): Hemoglobin A1C: 5.6 % (ref 4.0–5.6)

## 2022-01-14 MED ORDER — IBUPROFEN 600 MG PO TABS: 600.0000 mg | ORAL_TABLET | Freq: Four times a day (QID) | ORAL | 3 refills | Status: DC | PRN

## 2022-01-14 MED ORDER — TIZANIDINE HCL 4 MG PO TABS: 4.0000 mg | ORAL_TABLET | Freq: Four times a day (QID) | ORAL | 0 refills | Status: DC | PRN

## 2022-01-14 MED ORDER — OMEPRAZOLE 20 MG PO CPDR: 20.0000 mg | DELAYED_RELEASE_CAPSULE | Freq: Every day | ORAL | 3 refills | Status: DC

## 2022-01-14 NOTE — Progress Notes (Signed)
? ?  SUBJECTIVE:  ? ?CHIEF COMPLAINT / HPI:  ? ?Chief Complaint  ?Patient presents with  ? Fall  ?  Fell off bar stool at friends house  ? ? ? ?Summer Brewer is a 51 y.o. female here after falling off a barstool on Saturday night at a friend's house.  States that she was sitting on a stool all the way stool and stool broke.  She had impact to the right hip and buttock. She needed help getting up off the floor.  The next morning she woke up in lots of pain.  Has been taking ibuprofen and lidocaine patches with some help.  She is much more stiff and decided to make an appointment to be seen. Has lower back pain. No loss of bowel or bladder. No saddle paraesthesia.  ? ?Also, increased fatigue and a 20 pound weight gain in the last few months.  States that she had an ablation and since then has been having increased abdominal pain. No vomiting or diarrhea. Does not have a bowel movement daily. Takes pepcid for acid reflux.  ? ? ? ?PERTINENT  PMH / PSH: reviewed and updated as appropriate  ? ?OBJECTIVE:  ? ?BP (!) 158/90   Pulse 87   Ht '5\' 7"'$  (1.702 m)   Wt 287 lb 2 oz (130.2 kg)   SpO2 100%   BMI 44.97 kg/m?   ? ?GEN: pleasant well appearing female, in no acute distress  ?CV: regular rate and rhythm ?RESP: no increased work of breathing, clear to ascultation bilaterally ?ABD: Bowel sounds present. Soft, epigastric tenderness, non-distended. No CVA tenderness ?MSK: non tender extremities, normal ROM of extremities, right lumbar paraspinal tenderness, limited flexion and extension of lumbar spine due to pain, no EXT edema, no bruising, no C, T or L spine midline tenderness  ?SKIN: warm, dry, no rash on visible skin ?NEURO: alert, moves all extremities appropriately, normal tone, strength 5/5 b/l LE and UE, gross sensation intact  ? ? ?ASSESSMENT/PLAN:  ? ? ?Fall  Low back Pain  ?Discussed with patient gradually returning to normal activities, as tolerated. Pt to continue ordinary activities within the limits  permitted by pain. Will prescribe ibuprofen 600 mg and Zanaflex for pain relief.  Continue Tylenol and Lidocaine patches. Advised patient to avoid other NSAIDs while taking Ibuprofen. Counseled patient on red flag symptoms and when to seek immediate care.  No red flags suggesting cauda equina syndrome or progressive major motor weakness. Patient to return if symptoms do not improve with conservative treatment.  ED precautions given. Consider imaging at follow up in 6-8 weeks.  ? ?Abdominal Pain  ?Obtain CMP, lipase and CBC. She had epigastric tenderness on exam concerning for GERD vs pancreatitis. No symptoms to suggest cystitis. Possibly constipation given history.  Start omeprazole. Doubt related to recent uterine ablation and hysteroscopy.  ? ?Weight gain  Obesity  ?On chart review, she has gained 20 lbs in the last 2-3 months. Obtain TSH, a1c, lipid panel, CMP to assess for metabolic causes.  Body mass index is 44.97 kg/m?Marland Kitchen She is a candidate for GLP-1.  ? ?Lyndee Hensen, DO ?PGY-3, [facility] ?01/14/2022  ? ? ? ? ? ? ? ? ?

## 2022-01-14 NOTE — Patient Instructions (Signed)
It was great seeing you today! ? ?Please check-out at the front desk before leaving the clinic. I'd like to see you back in 3 months but if you need to be seen earlier than that for any new issues we're happy to fit you in, just give [place] a call! ? ?Visit Remembers: ?- Stop by the pharmacy to pick up your prescriptions  ?- Continue to work on your healthy eating habits and incorporating exercise into your daily life.  ?- Your goal is to have an BP < 130/80 ? ?For abdominal pain: Start omeprazole for reflux/GERD.  He had some blood work to look at your kidneys, liver, electrolytes and pancreas.  I will let you know via MyChart what these lab work shows. ? ?For back pain: Take muscle relaxer when you can rest and ibuprofen 3 times a day as needed.  If pain persist after 2 weeks please be sure to follow-up in our office. ? ?Regarding lab work today:  ?Due to recent changes in healthcare laws, you may see the results of your imaging and laboratory studies on MyChart before your provider has had a chance to review them.  I understand that in some cases there may be results that are confusing or concerning to you. Not all laboratory results come back in the same time frame and you may be waiting for multiple results in order to interpret others.  Please give [place] 72 hours in order for your provider to thoroughly review all the results before contacting the office for clarification of your results. If everything is normal, you will get a letter in the mail or a message in My Chart. Please give [place] a call if you do not hear from [place] after 2 weeks. ? ?Please bring all of your medications with you to each visit.  ? ?Feel free to call with any questions or concerns at any time, at [phone]. ?  ?Take care,  ?Dr. Susa Simmonds ?[place]  ?

## 2022-01-15 LAB — COMPREHENSIVE METABOLIC PANEL
ALT: 17 IU/L (ref 0–32)
AST: 15 IU/L (ref 0–40)
Albumin/Globulin Ratio: 1.5 (ref 1.2–2.2)
Albumin: 4.1 g/dL (ref 3.8–4.8)
Alkaline Phosphatase: 68 IU/L (ref 44–121)
BUN/Creatinine Ratio: 9 (ref 9–23)
BUN: 9 mg/dL (ref 6–24)
Bilirubin Total: <0.2 mg/dL (ref 0.0–1.2)
CO2: 24 mmol/L (ref 20–29)
Calcium: 9 mg/dL (ref 8.7–10.2)
Chloride: 103 mmol/L (ref 96–106)
Creatinine, Ser: 0.99 mg/dL (ref 0.57–1.00)
Globulin, Total: 2.7 g/dL (ref 1.5–4.5)
Glucose: 97 mg/dL (ref 70–99)
Potassium: 3.9 mmol/L (ref 3.5–5.2)
Sodium: 140 mmol/L (ref 134–144)
Total Protein: 6.8 g/dL (ref 6.0–8.5)
eGFR: 70 mL/min/{1.73_m2} (ref >59)

## 2022-01-15 LAB — LIPID PANEL
Chol/HDL Ratio: 4.5 ratio — ABNORMAL HIGH (ref 0.0–4.4)
Cholesterol, Total: 192 mg/dL (ref 100–199)
HDL: 43 mg/dL (ref >39)
LDL Chol Calc (NIH): 113 mg/dL — ABNORMAL HIGH (ref 0–99)
Triglycerides: 208 mg/dL — ABNORMAL HIGH (ref 0–149)
VLDL Cholesterol Cal: 36 mg/dL (ref 5–40)

## 2022-01-15 LAB — TSH RFX ON ABNORMAL TO FREE T4: TSH: 2.25 u[IU]/mL (ref 0.450–4.500)

## 2022-01-15 LAB — LIPASE: Lipase: 22 U/L (ref 14–72)

## 2022-01-16 DIAGNOSIS — E785 Hyperlipidemia, unspecified: Secondary | ICD-10-CM | POA: Insufficient documentation

## 2022-01-16 MED ORDER — ROSUVASTATIN CALCIUM 5 MG PO TABS
5.0000 mg | ORAL_TABLET | Freq: Every day | ORAL | 3 refills | Status: DC
Start: 2022-01-16 — End: 2023-04-07

## 2022-01-18 ENCOUNTER — Other Ambulatory Visit: Payer: Self-pay | Admitting: Family Medicine

## 2022-01-18 DIAGNOSIS — F410 Panic disorder [episodic paroxysmal anxiety] without agoraphobia: Secondary | ICD-10-CM

## 2022-02-11 ENCOUNTER — Encounter: Payer: Self-pay | Admitting: *Deleted

## 2022-02-27 ENCOUNTER — Encounter: Payer: Self-pay | Admitting: Family Medicine

## 2022-02-27 ENCOUNTER — Ambulatory Visit (INDEPENDENT_AMBULATORY_CARE_PROVIDER_SITE_OTHER): Payer: Commercial Managed Care - HMO | Admitting: Family Medicine

## 2022-02-27 ENCOUNTER — Encounter: Payer: Self-pay | Admitting: Neurology

## 2022-02-27 VITALS — BP 128/80 | HR 64 | Ht 67.0 in | Wt 287.0 lb

## 2022-02-27 DIAGNOSIS — G43119 Migraine with aura, intractable, without status migrainosus: Secondary | ICD-10-CM | POA: Diagnosis not present

## 2022-02-27 DIAGNOSIS — G43109 Migraine with aura, not intractable, without status migrainosus: Secondary | ICD-10-CM

## 2022-02-27 DIAGNOSIS — L259 Unspecified contact dermatitis, unspecified cause: Secondary | ICD-10-CM | POA: Diagnosis not present

## 2022-02-27 DIAGNOSIS — I10 Essential (primary) hypertension: Secondary | ICD-10-CM

## 2022-02-27 MED ORDER — ONDANSETRON HCL 4 MG PO TABS
4.0000 mg | ORAL_TABLET | Freq: Three times a day (TID) | ORAL | 0 refills | Status: DC | PRN
Start: 2022-02-27 — End: 2022-10-29

## 2022-02-27 MED ORDER — HYDROCORTISONE 1 % EX OINT: 1.0000 | TOPICAL_OINTMENT | Freq: Two times a day (BID) | CUTANEOUS | 0 refills | Status: DC

## 2022-02-27 NOTE — Progress Notes (Unsigned)
     SUBJECTIVE:   CHIEF COMPLAINT / HPI:   Migraine headaches - Increasing in frequency now occurring 2 times a week for duration of 1 month - Usually only requires 1 triptan but has had to take a second pill to get rid of it - Has associated nausea but otherwise no vomiting or blurry vision   PERTINENT  PMH / PSH: ***  OBJECTIVE:   BP 128/80   Pulse 64   Ht '5\' 7"'$  (1.702 m)   Wt 287 lb (130.2 kg)   SpO2 98%   BMI 44.95 kg/m   ***  ASSESSMENT/PLAN:   No problem-specific Assessment & Plan notes found for this encounter.     Gerlene Fee, [place]

## 2022-02-27 NOTE — Patient Instructions (Signed)
It was wonderful to see you today.  Please bring ALL of your medications with you to every visit.   Today we talked about:  -Referring you to neurology for your migraine headaches -You can take zofran for nausea -Use hydrocortisone on your rash -Look up noom app to start having fun losing weight  Please be sure to schedule follow up at the front  desk before you leave today.   If you haven't already, sign up for My Chart to have easy access to your labs results, and communication with your primary care physician.  Please call the clinic at [phone] if your symptoms worsen or you have any concerns. It was our pleasure to serve you.  Dr. Janus Molder

## 2022-02-28 ENCOUNTER — Encounter: Payer: Self-pay | Admitting: Family Medicine

## 2022-03-04 MED ORDER — TRIAMCINOLONE ACETONIDE 0.1 % EX CREA: 1.0000 | TOPICAL_CREAM | Freq: Two times a day (BID) | CUTANEOUS | 0 refills | Status: DC

## 2022-03-13 ENCOUNTER — Other Ambulatory Visit: Payer: Self-pay | Admitting: Obstetrics and Gynecology

## 2022-03-13 ENCOUNTER — Other Ambulatory Visit: Payer: Self-pay | Admitting: Family Medicine

## 2022-03-13 DIAGNOSIS — N924 Excessive bleeding in the premenopausal period: Secondary | ICD-10-CM

## 2022-03-19 ENCOUNTER — Other Ambulatory Visit: Payer: Self-pay | Admitting: Family Medicine

## 2022-03-19 DIAGNOSIS — I10 Essential (primary) hypertension: Secondary | ICD-10-CM

## 2022-03-31 ENCOUNTER — Other Ambulatory Visit: Payer: Self-pay | Admitting: Family Medicine

## 2022-03-31 ENCOUNTER — Encounter: Payer: Self-pay | Admitting: Obstetrics and Gynecology

## 2022-03-31 DIAGNOSIS — L259 Unspecified contact dermatitis, unspecified cause: Secondary | ICD-10-CM

## 2022-04-02 ENCOUNTER — Telehealth: Payer: 59 | Admitting: Physician Assistant

## 2022-04-02 DIAGNOSIS — Z5321 Procedure and treatment not carried out due to patient leaving prior to being seen by health care provider: Secondary | ICD-10-CM

## 2022-04-02 NOTE — Progress Notes (Signed)
The patient no-showed for appointment despite this provider sending direct link x 2 with no response and waiting for at least 10 minutes from appointment time for patient to join. They will be marked as a NS for this appointment/time.   Summer Ellerman M Rainy Rothman, PA-C    

## 2022-04-03 ENCOUNTER — Telehealth: Payer: 59 | Admitting: Physician Assistant

## 2022-04-03 DIAGNOSIS — R300 Dysuria: Secondary | ICD-10-CM

## 2022-04-03 NOTE — Progress Notes (Signed)
Patient currently in [place] so cannot be evaluated via our department as we [place]. Gave her information to get set up with [facility] video visit with a provider licensed there.   No charge.

## 2022-04-03 NOTE — Patient Instructions (Signed)
You can connected with an [facility] provider by going to [email or web address]  Make sure to select your current location [facility]) so you can get set up with a provider licensed there.

## 2022-04-14 ENCOUNTER — Encounter: Payer: Self-pay | Admitting: Family Medicine

## 2022-04-14 ENCOUNTER — Encounter: Payer: Self-pay | Admitting: Student

## 2022-04-14 ENCOUNTER — Ambulatory Visit (INDEPENDENT_AMBULATORY_CARE_PROVIDER_SITE_OTHER): Payer: Commercial Managed Care - HMO | Admitting: Family Medicine

## 2022-04-14 VITALS — BP 139/95 | HR 103 | Ht 67.0 in | Wt 284.6 lb

## 2022-04-14 DIAGNOSIS — R310 Gross hematuria: Secondary | ICD-10-CM

## 2022-04-14 DIAGNOSIS — R1084 Generalized abdominal pain: Secondary | ICD-10-CM

## 2022-04-14 DIAGNOSIS — R300 Dysuria: Secondary | ICD-10-CM | POA: Diagnosis not present

## 2022-04-14 DIAGNOSIS — Z113 Encounter for screening for infections with a predominantly sexual mode of transmission: Secondary | ICD-10-CM | POA: Diagnosis not present

## 2022-04-14 DIAGNOSIS — R102 Pelvic and perineal pain: Secondary | ICD-10-CM

## 2022-04-14 LAB — POCT URINALYSIS DIP (MANUAL ENTRY)
Glucose, UA: NEGATIVE mg/dL
Ketones, POC UA: NEGATIVE mg/dL
Leukocytes, UA: NEGATIVE
Nitrite, UA: NEGATIVE
Spec Grav, UA: 1.025 (ref 1.010–1.025)
Urobilinogen, UA: 1 E.U./dL
pH, UA: 5.5 (ref 5.0–8.0)

## 2022-04-14 LAB — POCT UA - MICROSCOPIC ONLY: WBC, Ur, HPF, POC: NONE SEEN (ref 0–5)

## 2022-04-14 NOTE — Patient Instructions (Addendum)
I do feel like this may be related to a possible kidney stone, we will get a CT scan of your abdomen to make sure that is what it is.  If it is a kidney stone, you will use NSAIDs (ibuprofen) for pain control and make sure you are staying hydrated.  If the CT scan is negative, we will need to do a further work-up.  I have also ordered a urine culture to make sure there is nothing growing in your urine that we need to treat further.

## 2022-04-14 NOTE — Progress Notes (Signed)
    SUBJECTIVE:   CHIEF COMPLAINT / HPI:   Dysuria - Patient notes recent UTI when traveling to [place] in July - Noted clots coming from urethra on 7/22 - Was treated with Macrobid, has not had improvement in symptoms - Still having urge and pain associated with urination - Still continues to have hesitancy, increased Gonczy, dribbling  Hematuria -Has been present on gross examination for patient since UTI - Has been present on dipsticks for several months - Has not had any episodes of incontinence  PERTINENT  PMH / PSH: Reviewed  OBJECTIVE:   BP (!) 139/95   Pulse (!) 103   Ht '5\' 7"'$  (1.702 m)   Wt 284 lb 9.6 oz (129.1 kg)   SpO2 99%   BMI 44.57 kg/m   General: NAD, well-appearing, well-nourished Respiratory: No respiratory distress, breathing comfortably, able to speak in full sentences Skin: warm and dry, no rashes noted on exposed skin Psych: Appropriate affect and mood Abdomen  ASSESSMENT/PLAN:   Dysuria with hematuria Differential includes persistent UTI, urethritis, kidney stone.  Given history of colicky pain and no resolution with treatment of previous UTI, feel this is the most likely diagnosis at this time.  Slightly reassured given microscopic evaluation with normal amount of RBCs, I am cautious about the diagnosis without confirming.  Given patient's age, would need to at least consider bladder cancer in the differential. - Recommended NSAIDs and adequate hydration - CT renal study ordered - Urine culture ordered  STD testing - HIV and RPR testing per patient preference   Summer Brewer, [place]

## 2022-04-15 ENCOUNTER — Telehealth: Payer: Self-pay

## 2022-04-15 ENCOUNTER — Encounter: Payer: Self-pay | Admitting: Family Medicine

## 2022-04-15 LAB — RPR: RPR Ser Ql: NONREACTIVE

## 2022-04-15 LAB — HIV ANTIBODY (ROUTINE TESTING W REFLEX): HIV Screen 4th Generation wRfx: NONREACTIVE

## 2022-04-15 NOTE — Telephone Encounter (Signed)
Routed note for PA and change of facility to [facility] instead of [facility] to Dr. Oleh Genin.

## 2022-04-16 LAB — URINE CULTURE

## 2022-04-17 ENCOUNTER — Other Ambulatory Visit: Payer: Commercial Managed Care - HMO

## 2022-04-18 ENCOUNTER — Ambulatory Visit (HOSPITAL_COMMUNITY): Payer: Commercial Managed Care - HMO

## 2022-04-21 ENCOUNTER — Other Ambulatory Visit: Payer: Self-pay | Admitting: Family Medicine

## 2022-04-21 ENCOUNTER — Encounter: Payer: Self-pay | Admitting: Family Medicine

## 2022-04-21 DIAGNOSIS — E785 Hyperlipidemia, unspecified: Secondary | ICD-10-CM

## 2022-04-21 NOTE — Telephone Encounter (Signed)
Spoke with patient and she is on Crestor and is not taking Lipitor, denied refill request.  Patient asked why she was taken off oral birth control. Discussed with patient it is not recommended for use with migraine with aura and she can make another appointment to discuss birth control options. Declines interest in IUD and Nexplanon.  Patient frustrated with recent medication changes and I advised her to make an appointment to discuss further. Answered all questions.  Colletta [place], DO

## 2022-04-22 ENCOUNTER — Ambulatory Visit (HOSPITAL_COMMUNITY): Payer: 59

## 2022-04-22 ENCOUNTER — Other Ambulatory Visit: Payer: Self-pay | Admitting: Obstetrics and Gynecology

## 2022-04-23 ENCOUNTER — Other Ambulatory Visit: Payer: Self-pay | Admitting: Family Medicine

## 2022-04-23 DIAGNOSIS — R1084 Generalized abdominal pain: Secondary | ICD-10-CM

## 2022-04-23 NOTE — Addendum Note (Signed)
Addended by: Francene Castle on: 04/23/2022 05:21 PM   Modules accepted: Orders

## 2022-04-23 NOTE — Telephone Encounter (Signed)
Covering Dr. Ledell Noss box this week There is a notice in it that patient's insurance approved CT abdomen/pelvis without and with contrast, rather than a renal stone study.  Dr. Oleh Genin, can you place an updated order? I will place the info sent by the insurance company in the RN team inbox as there may be more to do with the prior auth.  Thanks Leeanne Rio, MD

## 2022-04-23 NOTE — Addendum Note (Signed)
Addended by: Leeanne Rio on: 04/23/2022 05:16 PM   Modules accepted: Orders

## 2022-04-23 NOTE — Progress Notes (Addendum)
Insurance approved wrong examination, patient's CT renal study was reordered as appropriate.

## 2022-05-09 ENCOUNTER — Encounter: Payer: Self-pay | Admitting: Family Medicine

## 2022-05-13 ENCOUNTER — Other Ambulatory Visit: Payer: Commercial Managed Care - HMO

## 2022-05-14 ENCOUNTER — Ambulatory Visit (HOSPITAL_COMMUNITY)
Admission: RE | Admit: 2022-05-14 | Discharge: 2022-05-14 | Disposition: A | Discharge status: Home / Self Care | Payer: Commercial Managed Care - HMO | Source: Ambulatory Visit | Attending: Family Medicine | Admitting: Family Medicine

## 2022-05-14 DIAGNOSIS — R310 Gross hematuria: Secondary | ICD-10-CM

## 2022-05-14 DIAGNOSIS — R1084 Generalized abdominal pain: Secondary | ICD-10-CM | POA: Diagnosis not present

## 2022-05-21 ENCOUNTER — Other Ambulatory Visit: Payer: Self-pay | Admitting: Family Medicine

## 2022-05-21 DIAGNOSIS — R1013 Epigastric pain: Secondary | ICD-10-CM

## 2022-05-27 NOTE — Patient Instructions (Addendum)
It was wonderful to see you today.  Please bring ALL of your medications with you to every visit.   Today we talked about:  You may have interstitial cystitis. We will still send your urine to the lab to see if it grows bacteria- which would be indiciative of another urinary tract infection.  In the meantime, we will start Amitriptyline to see if it helps your symptoms. Please STOP your Prozac. You should not take both. You should take 10 mg of Amitriptyline for 7 days, and then start taking 25 mg daily. Each week increase by 25 mg.   First week: 10 mg nightly Second week: 25 mg daily Third week: 50 mg daily Fourth week: 75 mg daily. Continue this daily and do not increase further.   You can also keep a diary of foods/drinks you are consuming and your bladder symptoms to see if you can identify your triggers.  Thank you for choosing [facility].   Please call [phone] with any questions about today's appointment.  Please be sure to schedule follow up at the front  desk before you leave today.   Sabino Dick, DO PGY-3 Family Medicine    Eating Plan for Interstitial Cystitis Interstitial cystitis (IC) is a long-term (chronic) condition that causes pain and pressure in the bladder, the lower abdomen, and the pelvic area. Other symptoms of IC include urinary urgency and frequency. Symptoms tend to come and go. Many people with IC find that certain foods trigger their symptoms. Different foods may be problematic for different people. Some foods are more likely to cause symptoms than others. Learning which foods bother you and which do not can help you come up with an eating plan to manage IC. What are tips for following this plan? You may find it helpful to work with a dietitian. A dietician can help you develop an eating plan by doing an elimination diet. This diet involves: Creating a list of foods that you think trigger your IC symptoms combined with the foods  that most commonly trigger symptoms for many people with IC. It may take several months to find out which foods bother you. Eliminating those foods from your diet for about one month, then reintroducing the foods one at a time to see which ones trigger your symptoms. Reading food labels Once you know which foods trigger your IC symptoms, you can avoid them. However, it is also a good idea to read food labels because some foods that trigger your symptoms may be included as ingredients in other foods. These ingredients may include: Soy. Worcestershire sauce. Vinegar. Alcohol. Artificial sweeteners. Monosodium glutamate. Other potential triggers include: Chili peppers. Tomato products. Citrus fruits, flavors, or juices. Shopping Shopping can be a challenge if many foods trigger your IC. When you go grocery shopping, bring a list of the foods you cannot eat. You can get an app for your phone that lets you know which foods are the safest and which you may want to avoid. You can find the app at the Interstitial Cystitis Network website: www.ic-network.com Meal planning Plan your meals according to the results of your elimination diet. If you have not done an elimination diet, plan meals according to IC food lists recommended by your health care provider or dietitian. These lists tell you which foods are least and most likely to cause symptoms. Avoid certain types of food when you go out to eat, such as pizza and foods typically served at [place], [place], and [place].  These foods often contain ingredients that can aggravate IC. General information Here are some general guidelines for an IC eating plan: Do not eat large portions. Drink plenty of fluids with your meals. Do not eat foods that are high in sugar, salt, or saturated fat. Choose whole fruits instead of juice. Eat a colorful variety of vegetables. What foods should I eat? For people with IC, the best diet is a balanced one that  includes things from all the food groups. Even if you have to avoid certain foods, there are still plenty of healthy choices in each group. The following are some foods that are least bothersome and may be safest to eat: Fruits Bananas. Blueberries and blueberry juice. Melons. Pears. Apples. Dates. Prunes. Raisins. Apricots. Vegetables Asparagus. Avocado. Celery. Beets. Bell peppers. Black olives. Broccoli. Brussels sprouts. Cabbage. Carrots. Cauliflower. Cucumber. Eggplant. Green beans. Potatoes. Radishes. Spinach. Squash. Turnips. Zucchini. Mushrooms. Peas. Grains Oats. Rice. Bran. Oatmeal. Whole wheat bread. Meats and other proteins Beef. Fish and other seafood. Eggs. Nuts. Peanut butter. Pork. Poultry. Lamb. Garbanzo beans. Pinto beans. Dairy Whole or low-fat milk. American, mozzarella, mild cheddar, feta, ricotta, and cream cheeses. The items listed above may not be a complete list of foods and beverages you can eat. Contact a dietitian for more information. What foods should I avoid? You should avoid any foods that seem to trigger your symptoms. It is also a good idea to avoid foods that are most likely to cause symptoms in many people with IC. These include the following: Fruits Citrus fruits, including lemons, limes, oranges, and grapefruit. Cranberries. Strawberries. Pineapple. Kiwi. Vegetables Chili peppers. Onions. Sauerkraut. Tomato and tomato products. Rosita Fire. Grains You do not need to avoid any type of grain unless it triggers your symptoms. Meats and other proteins Precooked or cured meats, such as sausages or meat loaves. Soy products. Dairy Chocolate ice cream. Processed cheese. Yogurt. Beverages Alcohol. Chocolate drinks. Coffee. Cranberry juice. Carbonated drinks. Tea (black, green, or herbal). Tomato juice. Sports drinks. The items listed above may not be a complete list of foods and beverages you should avoid. Contact a dietitian for more information. Summary Many  people with IC find that certain foods trigger their symptoms. Different foods may be problematic for different people. You may find it helpful to work with a dietitian to do an elimination diet and come up with an eating plan that is right for you. Plan your meals according to the results of your elimination diet. If you have not done an elimination diet, plan your meals using IC food lists. These lists tell you which foods are least and most likely to cause symptoms. The best diet for people with IC is a balanced diet that includes foods from all the food groups. Even if you have to avoid certain foods, there are still plenty of healthy choices in each group. This information is not intended to replace advice given to you by your health care provider. Make sure you discuss any questions you have with your health care provider. Document Revised: 09/29/2021 Document Reviewed: 09/29/2021 Elsevier Patient Education  2023 [employer].

## 2022-05-27 NOTE — Progress Notes (Signed)
    SUBJECTIVE:   CHIEF COMPLAINT / HPI:   Summer Brewer is a 51 y.o. female who presents to the [facility] clinic today to discuss the following concerns:   Urinary Sx Previously seen 2/21 for dysuria. UCx at that time was positive for pan-sensitive e.coli. and 2/23 for dysuria. Repeat culture a week later was negative.  Seen at [facility] on 7/29 for dysuria. Tx with Macrobid and pyridium.  Seen in [place] at [facility] clinic on 8/7 for persistent hematuria. Stated she had no relief with Macrobid. UCx showed less than 10k CFU of bacteria.  CT renal stone study performed 9/6 was negative Today she reports ***   PERTINENT  PMH / PSH: UTI, hematuria (negative on most recent check in august)   OBJECTIVE:   There were no vitals taken for this visit. ***  General: NAD, pleasant, able to participate in exam Respiratory: Breathing comfortably  Abdomen: Bowel sounds present, nontender, nondistended, no hepatosplenomegaly. Back: No CVAT *** Psych: Normal affect and mood  ASSESSMENT/PLAN:   No problem-specific Assessment & Plan notes found for this encounter.     Sharion Settler, [place]

## 2022-05-28 ENCOUNTER — Ambulatory Visit (INDEPENDENT_AMBULATORY_CARE_PROVIDER_SITE_OTHER): Payer: Commercial Managed Care - HMO | Admitting: Family Medicine

## 2022-05-28 VITALS — BP 145/91 | HR 82 | Ht 67.0 in | Wt 285.8 lb

## 2022-05-28 DIAGNOSIS — R350 Frequency of micturition: Secondary | ICD-10-CM | POA: Insufficient documentation

## 2022-05-28 LAB — POCT UA - MICROSCOPIC ONLY: WBC, Ur, HPF, POC: NONE SEEN (ref 0–5)

## 2022-05-28 LAB — POCT URINALYSIS DIP (MANUAL ENTRY)
Glucose, UA: NEGATIVE mg/dL
Leukocytes, UA: NEGATIVE
Nitrite, UA: NEGATIVE
Protein Ur, POC: 30 mg/dL — AB
Spec Grav, UA: >=1.03 — AB (ref 1.010–1.025)
Urobilinogen, UA: 1 E.U./dL
pH, UA: 5.5 (ref 5.0–8.0)

## 2022-05-28 MED ORDER — AMITRIPTYLINE HCL 25 MG PO TABS: 150.0000 mg | ORAL_TABLET | Freq: Every day | ORAL | 0 refills | Status: DC

## 2022-05-28 MED ORDER — AMITRIPTYLINE HCL 10 MG PO TABS: 10.0000 mg | ORAL_TABLET | Freq: Every day | ORAL | 0 refills | Status: DC

## 2022-05-28 NOTE — Assessment & Plan Note (Addendum)
Ongoing symptoms of urinary frequency.  Appears that she has had 2 true UTIs this year (I am only able to confirm the one in February though per patient report her most recent urine culture from the urgent care this month did grow E. Coli as well).  I discussed patient's previous UA results and her other urine cultures that showed no growth.  We explored that perhaps her symptoms are caused by something other than a urinary tract infection such as interstitial cystitis.  Genitourinary pain index questionnaire and interstitial cystitis questionnaires were completed today and showed 79% chance of having interstitial cystitis. Will trial amitriptyline for symptomatic relief.  She was also given information on potential dietary triggers.  Recommended that she keep a bladder diary to also help identify triggers.  If conservative measures don't help in the next month or two we could consider referral to Urology.  -F/u Ucx -Bladder diary -Stop Prozac. Start amitriptyline  -Return in 1-2 months

## 2022-06-10 ENCOUNTER — Other Ambulatory Visit: Payer: Self-pay | Admitting: General Practice

## 2022-06-16 ENCOUNTER — Ambulatory Visit: Payer: Commercial Managed Care - HMO | Admitting: Obstetrics and Gynecology

## 2022-06-16 ENCOUNTER — Encounter: Payer: Self-pay | Admitting: Obstetrics and Gynecology

## 2022-06-16 VITALS — BP 147/97 | HR 101 | Ht 67.0 in | Wt 285.9 lb

## 2022-06-16 DIAGNOSIS — N939 Abnormal uterine and vaginal bleeding, unspecified: Secondary | ICD-10-CM

## 2022-06-16 NOTE — Progress Notes (Signed)
Pt presents for f/u. Pt state she has not noticed any improvement since ablation. Periods are still irregular, bleeding varies in duration and flow.

## 2022-06-16 NOTE — Progress Notes (Signed)
51 yo P2 s/p endometrial ablation 11/19/2021 presenting with persistent AUB. Patient reports that since her endometrial ablation she has been experiencing irregular vaginal bleeding. She describes vaginal bleeding lasting 3-12 days. At the times the bleeding is very light and other times she finds herself changing 6-8 pads per day. She denies feeling dizzy or lightheaded. She is without any other complaints. Patient is now interested in definitive treatment with a hysterectomy  Past Medical History:  Diagnosis Date   Anemia    Back pain    Eczema    Fibroid    GERD (gastroesophageal reflux disease)    Hypertension    Migraines    Preterm labor    Umbilical hernia    Vaginal Pap smear, abnormal    Past Surgical History:  Procedure Laterality Date   CESAREAN SECTION  2000   x2 and 2002   DILITATION & CURRETTAGE/HYSTROSCOPY WITH HYDROTHERMAL ABLATION N/A 11/19/2021   Procedure: HYSTEROSCOPY WITH HYDROTHERMAL ABLATION;  Surgeon: Mora Bellman, MD;  Location: [facility];  Service: Gynecology;  Laterality: N/A;   INSERTION OF MESH  08/09/2015   Procedure: INSERTION OF MESH;  Surgeon: Donnie Mesa, MD;  Location: [facility];  Service: General;;   NASAL POLYP EXCISION Bilateral 2016   pt states she had polyps removed on both sides   TUBAL LIGATION     UMBILICAL HERNIA REPAIR  08/09/2015   Procedure: UMBILICAL HERNIA REPAIR;  Surgeon: Donnie Mesa, MD;  Location: [facility];  Service: General;;   Family History  Problem Relation Age of Onset   Hypertension Mother    Deep vein thrombosis Mother    Hypertension Father    Cancer - Prostate Father    Cancer Paternal Grandmother    Hypertension Other    Deep vein thrombosis Other    Leukemia Other    Colon cancer Neg Hx    Colon polyps Neg Hx    Esophageal cancer Neg Hx    Stomach cancer Neg Hx    Rectal cancer Neg Hx    Social History   Tobacco Use   Smoking status: Never   Smokeless tobacco: Never  Vaping Use   Vaping  Use: Never used  Substance Use Topics   Alcohol use: No   Drug use: No   ROS See pertinent in HPI. All other systems reviewed and non contributory Blood pressure (!) 158/109, pulse (!) 105, height '5\' 7"'$  (1.702 m), weight 285 lb 14.4 oz (129.7 kg). GENERAL: Well-developed, well-nourished female in no acute distress.  NEURO: alert and oriented x 3  A/P 51 yo with persistent AUB following endometrial ablation in March - Reviewed surgical options given history of 2 previous c-section - Patient is interested in vaginal hysterectomy and will be referred to meet with Dr. Rip Harbour for consultation

## 2022-06-23 ENCOUNTER — Other Ambulatory Visit: Payer: Self-pay | Admitting: Family Medicine

## 2022-06-23 DIAGNOSIS — I10 Essential (primary) hypertension: Secondary | ICD-10-CM

## 2022-07-09 NOTE — Progress Notes (Unsigned)
    SUBJECTIVE:   CHIEF COMPLAINT / HPI:   Itching: Patient presents with persistent itching that has not been relieved by Benadryl or Hydroxyzine. States she is constantly itching all over her body with her face, back, sides and breast being the most itchy areas. States it is worse when her stress and anxiety are worse. Has had increased anxiety recently due to family concerns. States she hasn't noticed changes in the skin but did have hives on her face one morning that went away with Benadryl. Denies new detergents or soap. Denies new foods, states her diet has been consistent. Denies any other associated symptoms such as headache, SOB, sore throat. States she never started amitriptyline for urinary concerns and does not want to take it.  PERTINENT  PMH / PSH: Anxiety, eczema  OBJECTIVE:   BP (!) 156/100   Pulse 85   Wt 287 lb (130.2 kg)   SpO2 98%   BMI 44.95 kg/m    General: NAD, pleasant, able to participate in exam Cardiac: RRR, no murmurs. Respiratory: CTAB, normal effort, No wheezes, rales or rhonchi Abdomen: Bowel sounds present, nontender, nondistended Extremities: no edema or cyanosis. Skin: warm and dry. Excoriations noted on L breast and side. Skin thickening under chin and L cheek. No papules, erythema or edema noted. Neuro: alert, no obvious focal deficits Psych: Normal affect and mood  ASSESSMENT/PLAN:   Itching Persistent itching without rash, not relieved by Benadryl or Hydroxyzine. Likely secondary to atopic dermatitis vs neurodermatitis. Will trial daily Cetirizine '10mg'$  with prn Hydroxyzine '10mg'$  at night for breakthrough itching. Associated with anxiety, will trial Sertraline '25mg'$ . Return in 3-4 weeks for derm check.   Patient received her flu shot  Dr. Colletta Maryland, [place]

## 2022-07-10 ENCOUNTER — Ambulatory Visit (INDEPENDENT_AMBULATORY_CARE_PROVIDER_SITE_OTHER): Payer: Commercial Managed Care - HMO | Admitting: Family Medicine

## 2022-07-10 ENCOUNTER — Other Ambulatory Visit: Payer: Self-pay

## 2022-07-10 ENCOUNTER — Encounter: Payer: Self-pay | Admitting: Family Medicine

## 2022-07-10 VITALS — BP 156/100 | HR 85 | Wt 287.0 lb

## 2022-07-10 DIAGNOSIS — Z23 Encounter for immunization: Secondary | ICD-10-CM | POA: Diagnosis not present

## 2022-07-10 DIAGNOSIS — L299 Pruritus, unspecified: Secondary | ICD-10-CM | POA: Diagnosis not present

## 2022-07-10 MED ORDER — CETIRIZINE HCL 10 MG PO TABS: 10.0000 mg | ORAL_TABLET | Freq: Every day | ORAL | 2 refills | Status: DC

## 2022-07-10 MED ORDER — HYDROXYZINE HCL 10 MG PO TABS: 10.0000 mg | ORAL_TABLET | Freq: Every evening | ORAL | 0 refills | Status: DC | PRN

## 2022-07-10 MED ORDER — SERTRALINE HCL 25 MG PO TABS: 25.0000 mg | ORAL_TABLET | Freq: Every day | ORAL | 1 refills | Status: DC

## 2022-07-10 NOTE — Patient Instructions (Signed)
It was wonderful to see you today.  Please bring ALL of your medications with you to every visit.   Today we talked about:  We are starting you on a couple of medications to help with the itching including: Certerizine '10mg'$  daily Hydroxyzine '10mg'$  at night as needed Sertraline '25mg'$  daily  Please follow up in 3-4 weeks about the itching  Thank you for choosing [facility].   Please call [phone] with any questions about today's appointment.  Please be sure to schedule follow up at the front desk before you leave today.   Colletta Maryland, DO Family Medicine

## 2022-07-10 NOTE — Assessment & Plan Note (Addendum)
Persistent itching without rash, not relieved by Benadryl or Hydroxyzine. Likely secondary to atopic dermatitis vs neurodermatitis. Will trial daily Cetirizine '10mg'$  with prn Hydroxyzine '10mg'$  at night for breakthrough itching. Associated with anxiety, will trial Sertraline '25mg'$ . Return in 3-4 weeks for derm check.

## 2022-07-15 ENCOUNTER — Encounter: Payer: Self-pay | Admitting: Obstetrics and Gynecology

## 2022-07-15 ENCOUNTER — Ambulatory Visit: Payer: Commercial Managed Care - HMO | Admitting: Obstetrics and Gynecology

## 2022-07-15 VITALS — BP 134/85 | HR 69 | Ht 67.0 in | Wt 287.0 lb

## 2022-07-15 DIAGNOSIS — D219 Benign neoplasm of connective and other soft tissue, unspecified: Secondary | ICD-10-CM | POA: Diagnosis not present

## 2022-07-15 DIAGNOSIS — N939 Abnormal uterine and vaginal bleeding, unspecified: Secondary | ICD-10-CM | POA: Diagnosis not present

## 2022-07-15 NOTE — Progress Notes (Signed)
Ms Manuelito presents in referral from Dr Elly Modena for possible hysterectomy Pt with H/O AUB/DUB dispite endometrial ablation 3/23 and medications Bleeding has been irregular since ablation, episodes last from 2-7 days, heavy with cramps and clots H/O anemia as well. Interferring with [facility]'s GYN U/S 12/22 uterine fibroid @ 2 cm and vol 135  Pt desires definite therapy  H/O c section x 2 and BTL  PE AF VSS Chaperone present Lungs clear Herat RRR Abd soft + BS GU Nl EGBUS, uterus small, mobile < 8 weeks, no masses or tenderness. Exam limited by pt habitus  A/P AUB        Uterine fibroids  Pt desires definite therpay TVH with possible salpingectomy reviewed with pt R/B/Post op care discussed. Information provided. Pt verbalized understanding and desires to proceed. Will schedule and F/U with post op appt.

## 2022-07-15 NOTE — Patient Instructions (Signed)
Vaginal Hysterectomy  A vaginal hysterectomy is a procedure to remove all or part of the uterus through a small incision in the vagina. In this procedure, your health care provider may remove your entire uterus, including the cervix. The cervix is the opening and bottom part of the uterus and is located between the vagina and the uterus. Sometimes, the ovaries and fallopian tubes are also removed. This surgery may be done to treat problems such as: Noncancerous growths in the uterus (uterine fibroids) that cause symptoms. A condition that causes the lining of the uterus to grow in other areas (endometriosis). Problems with pelvic support. Cancer of the cervix, ovaries, uterus, or tissue that lines the uterus (endometrium). Excessive bleeding in the uterus. When removing your uterus, your health care provider may also remove the ovaries and the fallopian tubes. After this procedure, you will no longer be able to have a baby, and you will no longer have a menstrual period. Tell a health care provider about: Any allergies you have. All medicines you are taking, including vitamins, herbs, eye drops, creams, and over-the-counter medicines. Any problems you or family members have had with anesthetic medicines. Any blood disorders you have. Any surgeries you have had. Any medical conditions you have. Whether you are pregnant or may be pregnant. What are the risks? Generally, this is a safe procedure. However, problems may occur, including: Bleeding. Infection. Blood clots in the legs or lungs. Damage to nearby structures or organs. Pain during sex. Allergic reactions to medicines. What happens before the procedure? Staying hydrated Follow instructions from your health care provider about hydration, which may include: Up to 2 hours before the procedure - you may continue to drink clear liquids, such as water, clear fruit juice, black coffee, and plain tea.  Eating and drinking  restrictions Follow instructions from your health care provider about eating and drinking, which may include: 8 hours before the procedure - stop eating heavy meals or foods, such as meat, fried foods, or fatty foods. 6 hours before the procedure - stop eating light meals or foods, such as toast or cereal. 6 hours before the procedure - stop drinking milk or drinks that contain milk. 2 hours before the procedure - stop drinking clear liquids. Medicines Take over-the-counter and prescription medicines only as told by your health care provider. You may be asked to take a medicine to empty your colon (bowel preparation). General instructions If you were asked to do a bowel preparation before the procedure, follow instructions from your health care provider. This procedure can affect the way you feel about yourself. Talk with your health care provider about the physical and emotional changes hysterectomy may cause. Do not use any products that contain nicotine or tobacco for at least 4 weeks before the procedure. These products include cigarettes, e-cigarettes, and chewing tobacco. If you need help quitting, ask your health care provider. Plan to have a responsible adult take you home from the hospital or clinic. Plan to have a responsible adult care for you for the time you are told after you leave the hospital or clinic. This is important. Surgery safety Ask your health care provider: How your surgery site will be marked. What steps will be taken to help prevent infection. These may include: Removing hair at the surgery site. Washing skin with a germ-killing soap. Receiving antibiotic medicine. What happens during the procedure? An IV will be inserted into one of your veins. You will be given one or more of the following:  A medicine to help you relax (sedative). A medicine to numb the area (local anesthetic). A medicine to make you fall asleep (general anesthetic). A medicine that is  injected into your spine to numb the area below and slightly above the injection site (spinal anesthetic). A medicine that is injected into an area of your body to numb everything below the injection site (regional anesthetic). Your surgeon will make an incision in your vagina. Your surgeon will locate and remove all or part of your uterus. Part or all of the uterus will be removed through the vagina. Your ovaries and fallopian tubes may be removed at the same time. The incision in your vagina will be closed with stitches (sutures) that dissolve over time. The procedure may vary among health care providers and hospitals. What happens after the procedure? Your blood pressure, heart rate, breathing rate, and blood oxygen level will be monitored until you leave the hospital or clinic. You will be encouraged to walk as soon as possible. You will also use a device or do breathing exercises to keep your lungs clear. You may have to wear compression stockings. These stockings help to prevent blood clots and reduce swelling in your legs. You will be given pain medicine as needed. You will need to wear a sanitary pad for vaginal discharge or bleeding. Summary A vaginal hysterectomy is a procedure to remove all or part of the uterus through the vagina. You may need a vaginal hysterectomy to treat a variety of abnormalities of the uterus. Plan to have a responsible adult take you home from the hospital or clinic. Plan to have a responsible adult care for you for the time you are told after you leave the hospital or clinic. This is important. This information is not intended to replace advice given to you by your health care provider. Make sure you discuss any questions you have with your health care provider. Document Revised: 11/06/2021 Document Reviewed: 04/27/2020 Elsevier Patient Education  [place].

## 2022-07-15 NOTE — Progress Notes (Signed)
51 y.o GYN presents for Hysterectomy Surgical Consultation.  Last PAP  10/07/2021 [facility]

## 2022-07-17 ENCOUNTER — Encounter: Payer: Self-pay | Admitting: Family Medicine

## 2022-07-21 NOTE — Progress Notes (Unsigned)
NEUROLOGY CONSULTATION NOTE  Summer Brewer MRN: [id number] DOB: Jun 08, 1971  Referring provider: Yehuda Savannah, MD Primary care provider: Colletta Maryland, MD  Reason for consult:  migraines  Assessment/Plan:   Migraine without aura, without status migrainosus, not intractable  Migraine prevention:  Start Emgality.  Cannot take Aimovig due to latex allergy Migraine rescue:  Stop eletriptan.  She will try Ubrelvy '100mg'$ .  Continue Zofran '4mg'$  for nausea Limit use of pain relievers to no more than 2 days out of week to prevent risk of rebound or medication-overuse headache. Keep headache diary Discussed lifestyle modification Follow up 4-5 months.    Subjective:  Summer Brewer is a 51 year old right-handed female with HTN who presents for migraines.  History supplemented by family medicine and referring provider's note.  Onset:  in her 39s Location:  left frontal/occipital Quality:  pounding Intensity:  severe.   Aura:  absent Prodrome:  absent Associated symptoms:  Nausea, sometimes vomiting, numbness on back of head, photophobia, phonophobia, sees floaters.  She denies associated unilateral numbness or weakness. Duration:  2 days Frequency:  3 times a month (total 6 headache days a month) Frequency of abortive medication: three times a month Triggers:  Possibly perfumes Relieving factors:  rest in dark and quiet room Activity:  aggravates  MRI of brain without contrast on 11/28/2016 personally reviewed showed evidence of pansinusitis but no abnormal intracranial abnormality.  Past NSAIDS/analgesics:  diclofenac, meloxicam, naparoxen, ibuprofen, tramadol Past abortive triptans:  sumatriptan tab Past abortive ergotamine:  none Past muscle relaxants:  cyclobenzaprine Past anti-emetic:  metoclopramide Past antihypertensive medications:  propranolol, lisinopril, losartam Past antidepressant medications:  venlafaxine, dozepin, escitalopram, fluoxetine, citalopram  Past  anticonvulsant medications:  topiramate Past anti-CGRP:  none Other past therapies:  none  Current NSAIDS/analgesics:  none Current triptans:  eletriptan '20mg'$  Current ergotamine:  none Current anti-emetic:  Zofran '4mg'$  Current muscle relaxants:  tizanidien '4mg'$  PRN Current Antihypertensive medications:  metoprolol, amlodipine, HCTZ Current Antidepressant medications:  sertaline '25mg'$  Current Anticonvulsant medications:  none Current anti-CGRP:  none Current Vitamins/Herbal/Supplements:  none Current Antihistamines/Decongestants:  cetirizine, Flonase, hydroxyzine Other therapy:  none   Caffeine:  rarely coffee Diet:  Drinks Sprite.  Does not drink water.  Sometimes skips breakfast Exercise:  no Depression:  no; Anxiety:  some Other pain:  no Sleep hygiene:  poor.  Trouble falling asleep.  Sleeps no more than 5 hours a night.  She previously worked third shift as a Engineer, water in the ED. Family history of headache:  no       PAST MEDICAL HISTORY: Past Medical History:  Diagnosis Date   Anemia    Back pain    Eczema    Fibroid    GERD (gastroesophageal reflux disease)    Hypertension    Migraines    Preterm labor    Umbilical hernia    Vaginal Pap smear, abnormal     PAST SURGICAL HISTORY: Past Surgical History:  Procedure Laterality Date   CESAREAN SECTION  2000   x2 and 2002   DILITATION & CURRETTAGE/HYSTROSCOPY WITH HYDROTHERMAL ABLATION N/A 11/19/2021   Procedure: HYSTEROSCOPY WITH HYDROTHERMAL ABLATION;  Surgeon: Mora Bellman, MD;  Location: [facility];  Service: Gynecology;  Laterality: N/A;   INSERTION OF MESH  08/09/2015   Procedure: INSERTION OF MESH;  Surgeon: Donnie Mesa, MD;  Location: [facility];  Service: General;;   NASAL POLYP EXCISION Bilateral 2016   pt states she had polyps removed on both sides   TUBAL  LIGATION     UMBILICAL HERNIA REPAIR  08/09/2015   Procedure: UMBILICAL HERNIA REPAIR;  Surgeon: Donnie Mesa, MD;  Location: [facility];   Service: General;;    MEDICATIONS: Current Outpatient Medications on File Prior to Visit  Medication Sig Dispense Refill   amLODipine (NORVASC) 10 MG tablet TAKE 1 TABLET BY MOUTH EVERY DAY 30 tablet 5   cetirizine (ZYRTEC) 10 MG tablet Take 1 tablet (10 mg total) by mouth daily. 30 tablet 2   eletriptan (RELPAX) 20 MG tablet TAKE 1 TAB BY MOUTH EVERY DAY AS NEEDED MIGRAINE OR HEADACHE. MAY REPEAT IN 2HRS IF PERSISTS/RECURS 12 tablet 1   famotidine (PEPCID) 20 MG tablet Take 1 tablet (20 mg total) by mouth 2 (two) times daily as needed for heartburn or indigestion. 180 tablet 1   fluticasone (FLONASE) 50 MCG/ACT nasal spray Place 2 sprays into both nostrils daily. Takes as needed 48 mL 2   hydrochlorothiazide (HYDRODIURIL) 25 MG tablet Take 1 tablet (25 mg total) by mouth daily. 90 tablet 3   hydrOXYzine (ATARAX) 10 MG tablet Take 1 tablet (10 mg total) by mouth at bedtime as needed for itching. 30 tablet 0   metoprolol tartrate (LOPRESSOR) 50 MG tablet Take 1 tablet (50 mg total) by mouth 2 (two) times daily. 180 tablet 3   omeprazole (PRILOSEC) 20 MG capsule TAKE 1 CAPSULE BY MOUTH EVERY DAY 30 capsule 3   ondansetron (ZOFRAN) 4 MG tablet Take 1 tablet (4 mg total) by mouth every 8 (eight) hours as needed for nausea or vomiting. 20 tablet 0   rosuvastatin (CRESTOR) 5 MG tablet Take 1 tablet (5 mg total) by mouth daily. 90 tablet 3   sertraline (ZOLOFT) 25 MG tablet Take 1 tablet (25 mg total) by mouth daily. 30 tablet 1   tiZANidine (ZANAFLEX) 4 MG tablet Take 1 tablet (4 mg total) by mouth every 6 (six) hours as needed for muscle spasms. 30 tablet 0   triamcinolone cream (KENALOG) 0.1 % Apply 1 Application topically 2 (two) times daily. 30 g 0   No current facility-administered medications on file prior to visit.    ALLERGIES: Allergies  Allergen Reactions   Latex Hives and Itching    FAMILY HISTORY: Family History  Problem Relation Age of Onset   Hypertension Mother    Deep  vein thrombosis Mother    Hypertension Father    Cancer - Prostate Father    Cancer Paternal Grandmother    Hypertension Other    Deep vein thrombosis Other    Leukemia Other    Colon cancer Neg Hx    Colon polyps Neg Hx    Esophageal cancer Neg Hx    Stomach cancer Neg Hx    Rectal cancer Neg Hx     Objective:  Blood pressure 134/86, pulse 81, height '5\' 7"'$  (1.702 m), weight 292 lb 9.6 oz (132.7 kg), last menstrual period 07/09/2022, SpO2 97 %. General: No acute distress.  Patient appears well-groomed.   Head:  Normocephalic/atraumatic Eyes:  fundi examined but not visualized Neck: supple, no paraspinal tenderness, full range of motion Back: No paraspinal tenderness Heart: regular rate and rhythm Lungs: Clear to auscultation bilaterally. Vascular: No carotid bruits. Neurological Exam: Mental status: alert and oriented to person, place, and time, speech fluent and not dysarthric, language intact. Cranial nerves: CN I: not tested CN II: pupils equal, round and reactive to light, visual fields intact CN III, IV, VI:  full range of motion, no nystagmus, no ptosis  CN V: facial sensation intact. CN VII: upper and lower face symmetric CN VIII: hearing intact CN IX, X: gag intact, uvula midline CN XI: sternocleidomastoid and trapezius muscles intact CN XII: tongue midline Bulk & Tone: normal, no fasciculations. Motor:  muscle strength 5/5 throughout Sensation:  Pinprick, temperature and vibratory sensation intact. Deep Tendon Reflexes:  2+ throughout,  toes downgoing.   Finger to nose testing:  Without dysmetria.   Heel to shin:  Without dysmetria.   Gait:  Normal station and stride.  Romberg negative.    Thank you for allowing me to take part in the care of this patient.  Metta Clines, DO  CC:  Yehuda Savannah, MD  Colletta Maryland, MD

## 2022-07-22 ENCOUNTER — Ambulatory Visit: Payer: Commercial Managed Care - HMO | Admitting: Neurology

## 2022-07-22 ENCOUNTER — Encounter: Payer: Self-pay | Admitting: Neurology

## 2022-07-22 ENCOUNTER — Other Ambulatory Visit: Payer: Self-pay | Admitting: Neurology

## 2022-07-22 ENCOUNTER — Other Ambulatory Visit: Payer: Self-pay | Admitting: Family Medicine

## 2022-07-22 ENCOUNTER — Other Ambulatory Visit (HOSPITAL_COMMUNITY): Payer: Self-pay

## 2022-07-22 VITALS — BP 134/86 | HR 81 | Ht 67.0 in | Wt 292.6 lb

## 2022-07-22 DIAGNOSIS — F410 Panic disorder [episodic paroxysmal anxiety] without agoraphobia: Secondary | ICD-10-CM

## 2022-07-22 DIAGNOSIS — G43109 Migraine with aura, not intractable, without status migrainosus: Secondary | ICD-10-CM | POA: Diagnosis not present

## 2022-07-22 MED ORDER — UBRELVY 100 MG PO TABS: 1.0000 | ORAL_TABLET | ORAL | 5 refills | Status: DC | PRN

## 2022-07-22 MED ORDER — EMGALITY 120 MG/ML ~~LOC~~ SOAJ: 240.0000 mg | Freq: Once | SUBCUTANEOUS | 0 refills | Status: AC

## 2022-07-22 MED ORDER — EMGALITY 120 MG/ML ~~LOC~~ SOAJ: 120.0000 mg | SUBCUTANEOUS | 11 refills | Status: DC

## 2022-07-22 NOTE — Patient Instructions (Addendum)
  Start Emgality - 2 injections for first dose, then 1 injection every 28 days thereafter.   Stop eletriptan.  Take Ubrelvy at earliest onset of headache.  May repeat dose once in 2 hours if needed.  Maximum 2 tablets in 24 hours.  Ondansetron for nausea Limit use of pain relievers to no more than 2 days out of the week.  These medications include acetaminophen, NSAIDs (ibuprofen/Advil/Motrin, naproxen/Aleve, triptans (Imitrex/sumatriptan), Excedrin, and narcotics.  This will help reduce risk of rebound headaches. Be aware of common food triggers:  - Caffeine:  coffee, black tea, cola, Mt. Dew  - Chocolate  - Dairy:  aged cheeses (brie, blue, cheddar, gouda, [place], provolone, [place], Swiss, etc), chocolate milk, buttermilk, sour cream, limit eggs and yogurt  - Nuts, peanut butter  - Alcohol  - Cereals/grains:  FRESH breads (fresh bagels, sourdough, doughnuts), yeast productions  - Processed/canned/aged/cured meats (pre-packaged deli meats, hotdogs)  - MSG/glutamate:  soy sauce, flavor enhancer, pickled/preserved/marinated foods  - Sweeteners:  aspartame (Equal, Nutrasweet).  Sugar and Splenda are okay  - Vegetables:  legumes (lima beans, lentils, snow peas, fava beans, pinto peans, peas, garbanzo beans), sauerkraut, onions, olives, pickles  - Fruit:  avocados, bananas, citrus fruit (orange, lemon, grapefruit), mango  - Other:  Frozen meals, macaroni and cheese Routine exercise Stay adequately hydrated (aim for 64 oz water daily) Keep headache diary Maintain proper stress management Maintain proper sleep hygiene Do not skip meals Consider supplements:  magnesium citrate '400mg'$  daily, riboflavin '400mg'$  daily, coenzyme Q10 '100mg'$  three times daily. Follow up 4-5 months.

## 2022-07-23 ENCOUNTER — Telehealth: Payer: Self-pay

## 2022-07-23 ENCOUNTER — Other Ambulatory Visit (HOSPITAL_COMMUNITY): Payer: Self-pay

## 2022-07-23 NOTE — Telephone Encounter (Signed)
Patient Advocate Encounter   Received notification from [employer] that prior authorization is required for Emgality '120MG'$ /ML auto-injectors (migraine)  Submitted: 07-23-2022 Key [id number]  Status is pending

## 2022-07-23 NOTE — Telephone Encounter (Signed)
Patient Advocate Encounter  Prior Authorization for Emgality '120MG'$ /ML auto-injectors (migraine) has been approved.    Effective: 07-23-2022 to 01-19-2023

## 2022-07-23 NOTE — Telephone Encounter (Signed)
PA has been started in another encounter.

## 2022-07-23 NOTE — Telephone Encounter (Signed)
Patient advised of approval. Patient aware to call the office if she has any issues pick up.

## 2022-07-24 ENCOUNTER — Other Ambulatory Visit (HOSPITAL_COMMUNITY): Payer: Self-pay

## 2022-08-01 ENCOUNTER — Other Ambulatory Visit: Payer: Self-pay | Admitting: Family Medicine

## 2022-08-01 DIAGNOSIS — L299 Pruritus, unspecified: Secondary | ICD-10-CM

## 2022-08-04 ENCOUNTER — Other Ambulatory Visit: Payer: Self-pay | Admitting: Family Medicine

## 2022-08-04 ENCOUNTER — Telehealth: Payer: Self-pay | Admitting: Neurology

## 2022-08-04 DIAGNOSIS — L299 Pruritus, unspecified: Secondary | ICD-10-CM

## 2022-08-04 MED ORDER — EMGALITY 120 MG/ML ~~LOC~~ SOAJ: 240.0000 mg | Freq: Once | SUBCUTANEOUS | 0 refills | Status: AC

## 2022-08-04 NOTE — Telephone Encounter (Signed)
Loading dose sent to the [facility] as Requested. Tried calling patient to advise, unable to leave a vm.

## 2022-08-04 NOTE — Telephone Encounter (Signed)
1. Which medications need refilled? (List name and dosage, if known) Emgality  2. Which pharmacy/location is medication to be sent to? (include street and city if local pharmacy) [address]   She has been having trouble getting them at [facility] since they were out of stock. They transferred the prescription to [facility] for her, but they only sent it for 1 shot not 2 and they keep telling her no one takes 2. [facility] told her to have [place] sent the prescription to them. They have them in stock.

## 2022-08-07 ENCOUNTER — Ambulatory Visit: Payer: Commercial Managed Care - HMO

## 2022-08-08 ENCOUNTER — Ambulatory Visit: Payer: Commercial Managed Care - HMO

## 2022-08-11 ENCOUNTER — Ambulatory Visit: Payer: Commercial Managed Care - HMO | Admitting: Family Medicine

## 2022-08-18 ENCOUNTER — Telehealth: Payer: Self-pay | Admitting: Pharmacy Technician

## 2022-08-18 NOTE — Telephone Encounter (Signed)
Submitted a Prior Authorization request to [employer] for  [employer]  via CoverMyMeds. Will update once we receive a response.  KeyDorna Mai - PA Case ID: [id number]

## 2022-08-20 NOTE — Telephone Encounter (Signed)
Received notification from [place] regarding a prior authorization for  Ubrelvy '100mg'$  . Authorization has been APPROVED from 08/18/22 to 08/18/23.   Authorization # KeyDorna Mai) Case ID #: [id number]

## 2022-08-21 ENCOUNTER — Other Ambulatory Visit: Payer: Self-pay | Admitting: Family Medicine

## 2022-08-21 DIAGNOSIS — I10 Essential (primary) hypertension: Secondary | ICD-10-CM

## 2022-08-28 ENCOUNTER — Other Ambulatory Visit: Payer: Self-pay | Admitting: Neurology

## 2022-09-12 NOTE — Progress Notes (Signed)
Surgical Instructions    Your procedure is scheduled on Tuesday 23rd .  Report to [facility] Main Entrance "A" at 5:30 A.M., then check in with the Admitting office.  Call this number if you have problems the morning of surgery:  [phone]   If you have any questions prior to your surgery date call [phone]: Open Monday-Friday 8am-4pm If you experience any cold or flu symptoms such as cough, fever, chills, shortness of breath, etc. between now and your scheduled surgery, please notify us at the above number     Remember:  Do not eat after midnight the night before your surgery  You may drink clear liquids until 4:30 the morning of your surgery.   Clear liquids allowed are: Water, Non-Citrus Juices (without pulp), Carbonated Beverages, Clear Tea, Black Coffee ONLY (NO MILK, CREAM OR POWDERED CREAMER of any kind), and Gatorade    Take these medicines the morning of surgery with A SIP OF WATER: amLODipine (NORVASC)  cetirizine (ZYRTEC)  famotidine (PEPCID  fluticasone (FLONASE)  Galcanezumab-gnlm (EMGALITY  metoprolol tartrate (LOPRESSOR)  omeprazole (PRILOSEC  sertraline (ZOLOFT  rosuvastatin (CRESTOR)   IF NEEDED ondansetron (ZOFRAN)  tiZANidine (ZANAFLEX)  UBRELVY    As of today, STOP taking any Aspirin (unless otherwise instructed by your surgeon) Aleve, Naproxen, Ibuprofen, Motrin, Advil, Goody's, BC's, all herbal medications, fish oil, and all vitamins.           Do not wear jewelry or makeup. Do not wear lotions, powders, perfumes or deodorant. Do not shave 48 hours prior to surgery.   Do not bring valuables to the hospital. Do not wear nail polish, gel polish, artificial nails, or any other type of covering on natural nails (fingers and toes) If you have artificial nails or gel coating that need to be removed by a nail salon, please have this removed prior to surgery. Artificial nails or gel coating may interfere with anesthesia's ability to adequately monitor  your vital signs.  [facility] is not responsible for any belongings or valuables.    Do NOT Smoke (Tobacco/Vaping)  24 hours prior to your procedure  If you use a CPAP at night, you may bring your mask for your overnight stay.   Contacts, glasses, hearing aids, dentures or partials may not be worn into surgery, please bring cases for these belongings   For patients admitted to the hospital, discharge time will be determined by your treatment team.   Patients discharged the day of surgery will not be allowed to drive home, and someone needs to stay with them for 24 hours.   SURGICAL WAITING ROOM VISITATION Patients having surgery or a procedure may have no more than 2 support people in the waiting area - these visitors may rotate.   Children under the age of 48 must have an adult with them who is not the patient. If the patient needs to stay at the hospital during part of their recovery, the visitor guidelines for inpatient rooms apply. Pre-op nurse will coordinate an appropriate time for 1 support person to accompany patient in pre-op.  This support person may not rotate.   Please refer to [email or web address] for the visitor guidelines for Inpatients (after your surgery is over and you are in a regular room).    Special instructions:    Oral Hygiene is also important to reduce your risk of infection.  Remember - BRUSH YOUR TEETH THE MORNING OF SURGERY WITH YOUR REGULAR TOOTHPASTE   [place]- Preparing For Surgery  Before  surgery, you can play an important role. Because skin is not sterile, your skin needs to be as free of germs as possible. You can reduce the number of germs on your skin by washing with CHG (chlorahexidine gluconate) Soap before surgery.  CHG is an antiseptic cleaner which kills germs and bonds with the skin to continue killing germs even after washing.     Please do not use if you have an allergy to CHG or  antibacterial soaps. If your skin becomes reddened/irritated stop using the CHG.  Do not shave (including legs and underarms) for at least 48 hours prior to first CHG shower. It is OK to shave your face.  Please follow these instructions carefully.     Shower the NIGHT BEFORE SURGERY and the MORNING OF SURGERY with CHG Soap.   If you chose to wash your hair, wash your hair first as usual with your normal shampoo. After you shampoo, rinse your hair and body thoroughly to remove the shampoo.  Then [employer] and genitals (private parts) with your normal soap and rinse thoroughly to remove soap.  After that Use CHG Soap as you would any other liquid soap. You can apply CHG directly to the skin and wash gently with a scrungie or a clean washcloth.   Apply the CHG Soap to your body ONLY FROM THE NECK DOWN.  Do not use on open wounds or open sores. Avoid contact with your eyes, ears, mouth and genitals (private parts). Wash Face and genitals (private parts)  with your normal soap.   Wash thoroughly, paying special attention to the area where your surgery will be performed.  Thoroughly rinse your body with warm water from the neck down.  DO NOT shower/wash with your normal soap after using and rinsing off the CHG Soap.  Pat yourself dry with a CLEAN TOWEL.  Wear CLEAN PAJAMAS to bed the night before surgery  Place CLEAN SHEETS on your bed the night before your surgery  DO NOT SLEEP WITH PETS.   Day of Surgery:  Take a shower with CHG soap. Wear Clean/Comfortable clothing the morning of surgery Do not apply any deodorants/lotions.   Remember to brush your teeth WITH YOUR REGULAR TOOTHPASTE.    If you received a COVID test during your pre-op visit, it is requested that you wear a mask when out in public, stay away from anyone that may not be feeling well, and notify your surgeon if you develop symptoms. If you have been in contact with anyone that has tested positive in the last 10 days,  please notify your surgeon.    Please read over the following fact sheets that you were given.

## 2022-09-13 DIAGNOSIS — N390 Urinary tract infection, site not specified: Secondary | ICD-10-CM | POA: Diagnosis not present

## 2022-09-13 NOTE — H&P (Signed)
Summer Brewer is an 52 y.o. female, G2P2 with H/O DUB despite endometrial ablation in 3/23. GYN U/S revealed uterine fibroids. Cycles have continued to be heavy with cramps and interfering with her ADL's. She desires definite therapy.   GYN U/S uterine fibroid @ 2 cm and vol 135 gms.  H/O C/S x 2 H/O BTL  Menstrual History: Menarche age: 55 No LMP recorded. Patient has had an ablation.    Past Medical History:  Diagnosis Date   Anemia    Back pain    Eczema    Fibroid    GERD (gastroesophageal reflux disease)    Hypertension    Migraines    Preterm labor    Umbilical hernia    Vaginal Pap smear, abnormal     Past Surgical History:  Procedure Laterality Date   CESAREAN SECTION  2000   x2 and 2002   DILITATION & CURRETTAGE/HYSTROSCOPY WITH HYDROTHERMAL ABLATION N/A 11/19/2021   Procedure: HYSTEROSCOPY WITH HYDROTHERMAL ABLATION;  Surgeon: Mora Bellman, MD;  Location: [facility];  Service: Gynecology;  Laterality: N/A;   INSERTION OF MESH  08/09/2015   Procedure: INSERTION OF MESH;  Surgeon: Donnie Mesa, MD;  Location: [facility];  Service: General;;   NASAL POLYP EXCISION Bilateral 2016   pt states she had polyps removed on both sides   TUBAL LIGATION     UMBILICAL HERNIA REPAIR  08/09/2015   Procedure: UMBILICAL HERNIA REPAIR;  Surgeon: Donnie Mesa, MD;  Location: [facility];  Service: General;;    Family History  Problem Relation Age of Onset   Hypertension Mother    Deep vein thrombosis Mother    Hypertension Father    Cancer - Prostate Father    Cancer Paternal Grandmother    Hypertension Other    Deep vein thrombosis Other    Leukemia Other    Colon cancer Neg Hx    Colon polyps Neg Hx    Esophageal cancer Neg Hx    Stomach cancer Neg Hx    Rectal cancer Neg Hx     Social History:  reports that she has never smoked. She has never used smokeless tobacco. She reports that she does not drink alcohol and does not use drugs.  Allergies:   Allergies  Allergen Reactions   Latex Hives and Itching    No medications prior to admission.    Review of Systems  Constitutional: Negative.   Respiratory: Negative.    Cardiovascular: Negative.   Gastrointestinal: Negative.   Genitourinary:  Positive for menstrual problem.    There were no vitals taken for this visit. Physical Exam Constitutional:      Appearance: Normal appearance.  Cardiovascular:     Rate and Rhythm: Normal rate and regular rhythm.  Pulmonary:     Effort: Pulmonary effort is normal.     Breath sounds: Normal breath sounds.  Abdominal:     General: Bowel sounds are normal.     Palpations: Abdomen is soft.  Genitourinary:    Comments: Nl EGBUS, uterus small, mobile, no masses or tenderness    No results found for this or any previous visit (from the past 24 hour(s)).  No results found.  Assessment/Plan: DUB Uterine fibroid  TVH with possible salpingectomy recommended and reviewed with pt. R/B/Post op care discussed with pt/ Pt desires to proceed.  [name] 09/13/2022, 9:49 PM

## 2022-09-15 ENCOUNTER — Other Ambulatory Visit: Payer: Self-pay

## 2022-09-15 ENCOUNTER — Encounter (HOSPITAL_COMMUNITY): Payer: Self-pay

## 2022-09-15 ENCOUNTER — Encounter (HOSPITAL_COMMUNITY)
Admission: RE | Admit: 2022-09-15 | Discharge: 2022-09-15 | Disposition: A | Discharge status: Home / Self Care | Payer: Commercial Managed Care - HMO | Source: Ambulatory Visit | Attending: Obstetrics and Gynecology | Admitting: Obstetrics and Gynecology

## 2022-09-15 DIAGNOSIS — Z01812 Encounter for preprocedural laboratory examination: Secondary | ICD-10-CM | POA: Insufficient documentation

## 2022-09-15 DIAGNOSIS — D219 Benign neoplasm of connective and other soft tissue, unspecified: Secondary | ICD-10-CM | POA: Diagnosis not present

## 2022-09-15 HISTORY — DX: Anxiety disorder, unspecified: F41.9

## 2022-09-15 LAB — TYPE AND SCREEN
ABO/RH(D): O POS
Antibody Screen: NEGATIVE

## 2022-09-15 LAB — BASIC METABOLIC PANEL
Anion gap: 8 (ref 5–15)
BUN: 10 mg/dL (ref 6–20)
CO2: 28 mmol/L (ref 22–32)
Calcium: 8.8 mg/dL — ABNORMAL LOW (ref 8.9–10.3)
Chloride: 104 mmol/L (ref 98–111)
Creatinine, Ser: 1.21 mg/dL — ABNORMAL HIGH (ref 0.44–1.00)
GFR, Estimated: 55 mL/min — ABNORMAL LOW (ref >60)
Glucose, Bld: 89 mg/dL (ref 70–99)
Potassium: 3.5 mmol/L (ref 3.5–5.1)
Sodium: 140 mmol/L (ref 135–145)

## 2022-09-15 LAB — CBC
HCT: 39.1 % (ref 36.0–46.0)
Hemoglobin: 12.3 g/dL (ref 12.0–15.0)
MCH: 28.1 pg (ref 26.0–34.0)
MCHC: 31.5 g/dL (ref 30.0–36.0)
MCV: 89.5 fL (ref 80.0–100.0)
Platelets: 181 10*3/uL (ref 150–400)
RBC: 4.37 MIL/uL (ref 3.87–5.11)
RDW: 13.7 % (ref 11.5–15.5)
WBC: 5.9 10*3/uL (ref 4.0–10.5)
nRBC: 0 % (ref 0.0–0.2)

## 2022-09-15 NOTE — Progress Notes (Signed)
PCP - Dr. Colletta [place] Cardiologist - denies  PPM/ICD - denies   Chest x-ray - denies EKG - 11/19/21 Stress Test - denies ECHO - denies Cardiac Cath - denies  Sleep Study - denies   DM- denies  Last dose of GLP1 agonist-  n/a   ASA/Blood Thinner Instructions: n/a   ERAS Protcol - yes PRE-SURGERY Ensure given at PAT  COVID TEST- n/a   Anesthesia review: no  Patient denies shortness of breath, fever, cough and chest pain at PAT appointment   All instructions explained to the patient, with a verbal understanding of the material. Patient agrees to go over the instructions while at home for a better understanding. The opportunity to ask questions was provided.

## 2022-09-16 ENCOUNTER — Other Ambulatory Visit: Payer: Self-pay | Admitting: Family Medicine

## 2022-09-16 DIAGNOSIS — L299 Pruritus, unspecified: Secondary | ICD-10-CM

## 2022-09-20 ENCOUNTER — Other Ambulatory Visit: Payer: Self-pay | Admitting: Family Medicine

## 2022-09-20 DIAGNOSIS — R1013 Epigastric pain: Secondary | ICD-10-CM

## 2022-09-24 ENCOUNTER — Other Ambulatory Visit: Payer: Self-pay | Admitting: Family Medicine

## 2022-09-24 DIAGNOSIS — L299 Pruritus, unspecified: Secondary | ICD-10-CM

## 2022-09-29 NOTE — Anesthesia Preprocedure Evaluation (Addendum)
Anesthesia Evaluation  Patient identified by MRN, date of birth, ID band Patient awake    Reviewed: Allergy & Precautions, NPO status , Patient's Chart, lab work & pertinent test results, reviewed documented beta blocker date and time   History of Anesthesia Complications Negative for: history of anesthetic complications  Airway Mallampati: II  TM Distance: >3 FB Neck ROM: Full    Dental  (+) Missing,    Pulmonary neg pulmonary ROS   Pulmonary exam normal        Cardiovascular hypertension, Pt. on medications and Pt. on home beta blockers Normal cardiovascular exam     Neuro/Psych  Headaches  Anxiety        GI/Hepatic Neg liver ROS,GERD  Medicated and Controlled,,  Endo/Other    Morbid obesity  Renal/GU negative Renal ROS  negative genitourinary   Musculoskeletal negative musculoskeletal ROS (+)    Abdominal   Peds  Hematology negative hematology ROS (+)   Anesthesia Other Findings Day of surgery medications reviewed with patient.  Reproductive/Obstetrics AUB, fibroids                              Anesthesia Physical Anesthesia Plan  ASA: 3  Anesthesia Plan: General   Post-op Pain Management: Toradol IV (intra-op)* and Tylenol PO (pre-op)*   Induction: Intravenous  PONV Risk Score and Plan: 4 or greater and Midazolam, Scopolamine patch - Pre-op, Dexamethasone, Ondansetron and Treatment may vary due to age or medical condition  Airway Management Planned: LMA  Additional Equipment: None  Intra-op Plan:   Post-operative Plan: Extubation in OR  Informed Consent: I have reviewed the patients History and Physical, chart, labs and discussed the procedure including the risks, benefits and alternatives for the proposed anesthesia with the patient or authorized representative who has indicated his/her understanding and acceptance.     Dental advisory given  Plan Discussed with:  CRNA  Anesthesia Plan Comments:          Anesthesia Quick Evaluation

## 2022-09-30 ENCOUNTER — Encounter (HOSPITAL_COMMUNITY): Payer: Self-pay | Admitting: Obstetrics and Gynecology

## 2022-09-30 ENCOUNTER — Other Ambulatory Visit: Payer: Self-pay

## 2022-09-30 ENCOUNTER — Ambulatory Visit (HOSPITAL_BASED_OUTPATIENT_CLINIC_OR_DEPARTMENT_OTHER): Payer: Commercial Managed Care - HMO | Admitting: Anesthesiology

## 2022-09-30 ENCOUNTER — Encounter (HOSPITAL_COMMUNITY)
Admission: AD | Disposition: A | Discharge status: Home / Self Care | Payer: Self-pay | Source: Home / Self Care | Attending: Obstetrics and Gynecology

## 2022-09-30 ENCOUNTER — Ambulatory Visit (HOSPITAL_COMMUNITY): Payer: Commercial Managed Care - HMO | Admitting: Anesthesiology

## 2022-09-30 ENCOUNTER — Observation Stay (HOSPITAL_COMMUNITY)
Admission: AD | Admit: 2022-09-30 | Discharge: 2022-10-01 | Disposition: A | Discharge status: Home / Self Care | Payer: Commercial Managed Care - HMO | Attending: Obstetrics and Gynecology | Admitting: Obstetrics and Gynecology

## 2022-09-30 DIAGNOSIS — I10 Essential (primary) hypertension: Secondary | ICD-10-CM

## 2022-09-30 DIAGNOSIS — Z79899 Other long term (current) drug therapy: Secondary | ICD-10-CM | POA: Diagnosis not present

## 2022-09-30 DIAGNOSIS — D259 Leiomyoma of uterus, unspecified: Secondary | ICD-10-CM | POA: Diagnosis not present

## 2022-09-30 DIAGNOSIS — N920 Excessive and frequent menstruation with regular cycle: Secondary | ICD-10-CM | POA: Diagnosis not present

## 2022-09-30 DIAGNOSIS — Z6841 Body Mass Index (BMI) 40.0 and over, adult: Secondary | ICD-10-CM

## 2022-09-30 DIAGNOSIS — Z91040 Latex allergy status: Secondary | ICD-10-CM | POA: Diagnosis not present

## 2022-09-30 DIAGNOSIS — D219 Benign neoplasm of connective and other soft tissue, unspecified: Principal | ICD-10-CM

## 2022-09-30 DIAGNOSIS — N938 Other specified abnormal uterine and vaginal bleeding: Secondary | ICD-10-CM | POA: Diagnosis present

## 2022-09-30 DIAGNOSIS — Z9889 Other specified postprocedural states: Secondary | ICD-10-CM

## 2022-09-30 DIAGNOSIS — N870 Mild cervical dysplasia: Secondary | ICD-10-CM

## 2022-09-30 HISTORY — PX: VAGINAL HYSTERECTOMY: SHX2639

## 2022-09-30 HISTORY — DX: Other specified postprocedural states: Z98.890

## 2022-09-30 LAB — POCT PREGNANCY, URINE: Preg Test, Ur: NEGATIVE

## 2022-09-30 SURGERY — HYSTERECTOMY, VAGINAL
Anesthesia: General | Site: Uterus | Laterality: Bilateral

## 2022-09-30 MED ORDER — HYDROCHLOROTHIAZIDE 25 MG PO TABS
25.0000 mg | ORAL_TABLET | Freq: Every day | ORAL | Status: DC
  Administered 2022-10-01: 25 mg via ORAL
  Filled 2022-09-30: qty 1

## 2022-09-30 MED ORDER — PHENYLEPHRINE 80 MCG/ML (10ML) SYRINGE FOR IV PUSH (FOR BLOOD PRESSURE SUPPORT)
PREFILLED_SYRINGE | INTRAVENOUS | Status: DC | PRN
  Administered 2022-09-30 (×5): 80 ug via INTRAVENOUS

## 2022-09-30 MED ORDER — SUGAMMADEX SODIUM 500 MG/5ML IV SOLN
INTRAVENOUS | Status: DC | PRN
  Administered 2022-09-30: 400 mg via INTRAVENOUS

## 2022-09-30 MED ORDER — AMISULPRIDE (ANTIEMETIC) 5 MG/2ML IV SOLN: 10.0000 mg | Freq: Once | INTRAVENOUS | Status: DC | PRN

## 2022-09-30 MED ORDER — ONDANSETRON HCL 4 MG/2ML IJ SOLN
4.0000 mg | Freq: Four times a day (QID) | INTRAMUSCULAR | Status: DC | PRN
  Administered 2022-09-30: 4 mg via INTRAVENOUS
  Filled 2022-09-30: qty 2

## 2022-09-30 MED ORDER — OXYCODONE HCL 5 MG PO TABS: 5.0000 mg | ORAL_TABLET | Freq: Once | ORAL | Status: DC | PRN

## 2022-09-30 MED ORDER — ACETAMINOPHEN 500 MG PO TABS
ORAL_TABLET | ORAL | Status: AC
  Administered 2022-09-30: 1000 mg via ORAL
  Filled 2022-09-30: qty 2

## 2022-09-30 MED ORDER — SERTRALINE HCL 25 MG PO TABS
25.0000 mg | ORAL_TABLET | Freq: Every day | ORAL | Status: DC
  Administered 2022-10-01: 25 mg via ORAL
  Filled 2022-09-30 (×2): qty 1

## 2022-09-30 MED ORDER — KETOROLAC TROMETHAMINE 30 MG/ML IJ SOLN
30.0000 mg | Freq: Four times a day (QID) | INTRAMUSCULAR | Status: AC
  Administered 2022-09-30 – 2022-10-01 (×4): 30 mg via INTRAVENOUS
  Filled 2022-09-30 (×4): qty 1

## 2022-09-30 MED ORDER — SOD CITRATE-CITRIC ACID 500-334 MG/5ML PO SOLN: 30.0000 mL | ORAL | Status: AC

## 2022-09-30 MED ORDER — KETOROLAC TROMETHAMINE 15 MG/ML IJ SOLN
INTRAMUSCULAR | Status: AC
  Filled 2022-09-30: qty 1

## 2022-09-30 MED ORDER — IBUPROFEN 800 MG PO TABS: 800.0000 mg | ORAL_TABLET | Freq: Three times a day (TID) | ORAL | Status: DC

## 2022-09-30 MED ORDER — LACTATED RINGERS IV SOLN: INTRAVENOUS | Status: DC

## 2022-09-30 MED ORDER — ACETAMINOPHEN 500 MG PO TABS: 1000.0000 mg | ORAL_TABLET | Freq: Once | ORAL | Status: AC

## 2022-09-30 MED ORDER — ROCURONIUM BROMIDE 10 MG/ML (PF) SYRINGE
PREFILLED_SYRINGE | INTRAVENOUS | Status: AC
  Filled 2022-09-30: qty 10

## 2022-09-30 MED ORDER — ONDANSETRON HCL 4 MG/2ML IJ SOLN
INTRAMUSCULAR | Status: DC | PRN
  Administered 2022-09-30: 4 mg via INTRAVENOUS

## 2022-09-30 MED ORDER — CEFAZOLIN SODIUM-DEXTROSE 2-4 GM/100ML-% IV SOLN
INTRAVENOUS | Status: AC
  Filled 2022-09-30: qty 100

## 2022-09-30 MED ORDER — ROCURONIUM BROMIDE 10 MG/ML (PF) SYRINGE
PREFILLED_SYRINGE | INTRAVENOUS | Status: DC | PRN
  Administered 2022-09-30: 80 mg via INTRAVENOUS

## 2022-09-30 MED ORDER — FENTANYL CITRATE (PF) 100 MCG/2ML IJ SOLN
INTRAMUSCULAR | Status: AC
  Filled 2022-09-30: qty 2

## 2022-09-30 MED ORDER — SCOPOLAMINE 1 MG/3DAYS TD PT72
1.0000 | MEDICATED_PATCH | Freq: Once | TRANSDERMAL | Status: DC
  Administered 2022-09-30: 1.5 mg via TRANSDERMAL
  Filled 2022-09-30: qty 1

## 2022-09-30 MED ORDER — HYDROMORPHONE HCL 1 MG/ML IJ SOLN
1.0000 mg | INTRAMUSCULAR | Status: DC | PRN
  Administered 2022-09-30: 1 mg via INTRAVENOUS
  Filled 2022-09-30: qty 1

## 2022-09-30 MED ORDER — 0.9 % SODIUM CHLORIDE (POUR BTL) OPTIME
TOPICAL | Status: DC | PRN
  Administered 2022-09-30: 1000 mL

## 2022-09-30 MED ORDER — METOPROLOL TARTRATE 50 MG PO TABS
50.0000 mg | ORAL_TABLET | Freq: Two times a day (BID) | ORAL | Status: DC
  Administered 2022-09-30 – 2022-10-01 (×2): 50 mg via ORAL
  Filled 2022-09-30 (×3): qty 1

## 2022-09-30 MED ORDER — ZOLPIDEM TARTRATE 5 MG PO TABS: 5.0000 mg | ORAL_TABLET | Freq: Every evening | ORAL | Status: DC | PRN

## 2022-09-30 MED ORDER — CHLORHEXIDINE GLUCONATE 0.12 % MT SOLN
OROMUCOSAL | Status: AC
  Administered 2022-09-30: 15 mL
  Filled 2022-09-30: qty 15

## 2022-09-30 MED ORDER — LIDOCAINE 2% (20 MG/ML) 5 ML SYRINGE
INTRAMUSCULAR | Status: DC | PRN
  Administered 2022-09-30: 100 mg via INTRAVENOUS
  Administered 2022-09-30: 80 mg via INTRAVENOUS

## 2022-09-30 MED ORDER — FAMOTIDINE 20 MG PO TABS: 20.0000 mg | ORAL_TABLET | Freq: Two times a day (BID) | ORAL | Status: DC | PRN

## 2022-09-30 MED ORDER — PANTOPRAZOLE SODIUM 40 MG PO TBEC
40.0000 mg | DELAYED_RELEASE_TABLET | Freq: Every day | ORAL | Status: DC
  Administered 2022-10-01: 40 mg via ORAL
  Filled 2022-09-30: qty 1

## 2022-09-30 MED ORDER — OXYCODONE-ACETAMINOPHEN 5-325 MG PO TABS
1.0000 | ORAL_TABLET | ORAL | Status: DC | PRN
  Administered 2022-09-30 – 2022-10-01 (×2): 1 via ORAL
  Filled 2022-09-30 (×2): qty 1

## 2022-09-30 MED ORDER — SIMETHICONE 80 MG PO CHEW: 80.0000 mg | CHEWABLE_TABLET | Freq: Four times a day (QID) | ORAL | Status: DC | PRN

## 2022-09-30 MED ORDER — PROPOFOL 10 MG/ML IV BOLUS
INTRAVENOUS | Status: DC | PRN
  Administered 2022-09-30 (×2): 200 mg via INTRAVENOUS

## 2022-09-30 MED ORDER — ACETAMINOPHEN 500 MG PO TABS: 1000.0000 mg | ORAL_TABLET | ORAL | Status: AC

## 2022-09-30 MED ORDER — LIDOCAINE 2% (20 MG/ML) 5 ML SYRINGE
INTRAMUSCULAR | Status: AC
  Filled 2022-09-30: qty 10

## 2022-09-30 MED ORDER — MIDAZOLAM HCL 2 MG/2ML IJ SOLN
INTRAMUSCULAR | Status: AC
  Filled 2022-09-30: qty 2

## 2022-09-30 MED ORDER — LACTATED RINGERS IV SOLN: INTRAVENOUS | Status: DC | PRN

## 2022-09-30 MED ORDER — PROPOFOL 10 MG/ML IV BOLUS
INTRAVENOUS | Status: AC
  Filled 2022-09-30: qty 20

## 2022-09-30 MED ORDER — FENTANYL CITRATE (PF) 100 MCG/2ML IJ SOLN
25.0000 ug | INTRAMUSCULAR | Status: DC | PRN
  Administered 2022-09-30 (×2): 25 ug via INTRAVENOUS

## 2022-09-30 MED ORDER — AMLODIPINE BESYLATE 10 MG PO TABS
10.0000 mg | ORAL_TABLET | Freq: Every day | ORAL | Status: DC
  Administered 2022-10-01: 10 mg via ORAL
  Filled 2022-09-30 (×2): qty 1

## 2022-09-30 MED ORDER — SENNA 8.6 MG PO TABS
1.0000 | ORAL_TABLET | Freq: Two times a day (BID) | ORAL | Status: DC
  Administered 2022-09-30 – 2022-10-01 (×2): 8.6 mg via ORAL
  Filled 2022-09-30 (×2): qty 1

## 2022-09-30 MED ORDER — MIDAZOLAM HCL 2 MG/2ML IJ SOLN
INTRAMUSCULAR | Status: DC | PRN
  Administered 2022-09-30: 2 mg via INTRAVENOUS

## 2022-09-30 MED ORDER — ONDANSETRON HCL 4 MG PO TABS: 4.0000 mg | ORAL_TABLET | Freq: Four times a day (QID) | ORAL | Status: DC | PRN

## 2022-09-30 MED ORDER — SOD CITRATE-CITRIC ACID 500-334 MG/5ML PO SOLN
ORAL | Status: AC
  Administered 2022-09-30: 30 mL via ORAL
  Filled 2022-09-30: qty 30

## 2022-09-30 MED ORDER — FENTANYL CITRATE (PF) 250 MCG/5ML IJ SOLN
INTRAMUSCULAR | Status: DC | PRN
  Administered 2022-09-30: 100 ug via INTRAVENOUS

## 2022-09-30 MED ORDER — DEXAMETHASONE SODIUM PHOSPHATE 10 MG/ML IJ SOLN
INTRAMUSCULAR | Status: DC | PRN
  Administered 2022-09-30: 5 mg via INTRAVENOUS

## 2022-09-30 MED ORDER — POVIDONE-IODINE 10 % EX SWAB
2.0000 | Freq: Once | CUTANEOUS | Status: AC
  Administered 2022-09-30: 2 via TOPICAL

## 2022-09-30 MED ORDER — CEFAZOLIN IN SODIUM CHLORIDE 3-0.9 GM/100ML-% IV SOLN
3.0000 g | INTRAVENOUS | Status: AC
  Administered 2022-09-30: 2 g via INTRAVENOUS

## 2022-09-30 MED ORDER — FENTANYL CITRATE (PF) 250 MCG/5ML IJ SOLN
INTRAMUSCULAR | Status: AC
  Filled 2022-09-30: qty 5

## 2022-09-30 MED ORDER — ROSUVASTATIN CALCIUM 5 MG PO TABS
5.0000 mg | ORAL_TABLET | Freq: Every day | ORAL | Status: DC
  Administered 2022-10-01: 5 mg via ORAL
  Filled 2022-09-30 (×2): qty 1

## 2022-09-30 MED ORDER — ONDANSETRON HCL 4 MG/2ML IJ SOLN
INTRAMUSCULAR | Status: AC
  Filled 2022-09-30: qty 2

## 2022-09-30 MED ORDER — DIPHENHYDRAMINE HCL 25 MG PO CAPS
25.0000 mg | ORAL_CAPSULE | Freq: Four times a day (QID) | ORAL | Status: DC | PRN
  Administered 2022-09-30: 25 mg via ORAL
  Filled 2022-09-30: qty 1

## 2022-09-30 MED ORDER — OXYCODONE-ACETAMINOPHEN 5-325 MG PO TABS
2.0000 | ORAL_TABLET | ORAL | Status: DC | PRN
  Administered 2022-09-30 – 2022-10-01 (×3): 2 via ORAL
  Filled 2022-09-30 (×3): qty 2

## 2022-09-30 MED ORDER — DEXAMETHASONE SODIUM PHOSPHATE 10 MG/ML IJ SOLN
INTRAMUSCULAR | Status: AC
  Filled 2022-09-30: qty 1

## 2022-09-30 MED ORDER — KETOROLAC TROMETHAMINE 15 MG/ML IJ SOLN
15.0000 mg | INTRAMUSCULAR | Status: AC
  Administered 2022-09-30: 30 mg via INTRAVENOUS

## 2022-09-30 MED ORDER — OXYCODONE HCL 5 MG/5ML PO SOLN: 5.0000 mg | Freq: Once | ORAL | Status: DC | PRN

## 2022-09-30 SURGICAL SUPPLY — 26 items
BAG COUNTER SPONGE SURGICOUNT (BAG) ×2 IMPLANT
BAG SPNG CNTER NS LX DISP (BAG) ×1
BLADE SURG 10 STRL SS (BLADE) ×2 IMPLANT
GAUZE 4X4 16PLY ~~LOC~~+RFID DBL (SPONGE) ×4 IMPLANT
GLOVE BIO SURGEON STRL SZ7.5 (GLOVE) ×2 IMPLANT
GLOVE BIOGEL PI IND STRL 8 (GLOVE) ×2 IMPLANT
GLOVE SURG ORTHO 8.0 STRL STRW (GLOVE) ×2 IMPLANT
GLOVE SURG UNDER POLY LF SZ7 (GLOVE) ×2 IMPLANT
GOWN STRL REUS W/ TWL LRG LVL3 (GOWN DISPOSABLE) ×2 IMPLANT
GOWN STRL REUS W/ TWL XL LVL3 (GOWN DISPOSABLE) ×4 IMPLANT
GOWN STRL REUS W/TWL LRG LVL3 (GOWN DISPOSABLE) ×1
GOWN STRL REUS W/TWL XL LVL3 (GOWN DISPOSABLE) ×2
HIBICLENS CHG 4% 4OZ BTL (MISCELLANEOUS) ×2 IMPLANT
KIT TURNOVER KIT B (KITS) ×2 IMPLANT
MANIFOLD NEPTUNE II (INSTRUMENTS) ×2 IMPLANT
NS IRRIG 1000ML POUR BTL (IV SOLUTION) ×2 IMPLANT
PACK VAGINAL WOMENS (CUSTOM PROCEDURE TRAY) ×2 IMPLANT
PAD OB MATERNITY 4.3X12.25 (PERSONAL CARE ITEMS) ×2 IMPLANT
SUT VIC AB 2-0 CT1 18 (SUTURE) ×2 IMPLANT
SUT VIC AB 2-0 CT1 27 (SUTURE) ×1
SUT VIC AB 2-0 CT1 TAPERPNT 27 (SUTURE) ×2 IMPLANT
SUT VIC AB PLUS 45CM 1-MO-4 (SUTURE) ×4 IMPLANT
SUT VICRYL 1 TIES 12X18 (SUTURE) ×2 IMPLANT
TOWEL GREEN STERILE FF (TOWEL DISPOSABLE) ×2 IMPLANT
TRAY FOLEY W/BAG SLVR 14FR (SET/KITS/TRAYS/PACK) ×2 IMPLANT
UNDERPAD 30X36 HEAVY ABSORB (UNDERPADS AND DIAPERS) ×2 IMPLANT

## 2022-09-30 NOTE — Transfer of Care (Signed)
Immediate Anesthesia Transfer of Care Note  Patient: Summer Brewer  Procedure(s) Performed: HYSTERECTOMY VAGINAL WITH BILATERAL SALPINGECTOMY (Bilateral: Uterus)  Patient Location: PACU  Anesthesia Type:General  Level of Consciousness: awake and alert   Airway & Oxygen Therapy: Patient Spontanous Breathing and Patient connected to face mask oxygen  Post-op Assessment: Report given to RN and Post -op Vital signs reviewed and stable  Post vital signs: Reviewed and stable  Last Vitals:  Vitals Value Taken Time  BP 134/83 09/30/22 0900  Temp    Pulse 80 09/30/22 0902  Resp 19 09/30/22 0902  SpO2 91 % 09/30/22 0902  Vitals shown include unvalidated device data.  Last Pain:  Vitals:   09/30/22 0622  PainSc: 0-No pain         Complications: No notable events documented.

## 2022-09-30 NOTE — Op Note (Signed)
Summer Brewer PROCEDURE DATE: 09/30/2022  PREOPERATIVE DIAGNOSIS:  Symptomatic fibroids, menorrhagia POSTOPERATIVE DIAGNOSIS:  Symptomatic fibroids, menorrhagia SURGEON:   Arlina Robes M.D. Terrence DupontJulieta Bellini, M.D. An experienced assistant was required given the standard of surgical care given the complexity of the case.  This assistant was needed for exposure, dissection, suctioning, retraction, and for overall help during the procedure.   OPERATION:  Total Vaginal hysterectomy and bilateral salpingectomy ANESTHESIA:  General endotracheal.  INDICATIONS: The patient is a 52 y.o. [id number] with history of symptomatic uterine fibroids/menorrhagia. The patient made a decision to undergo definite surgical treatment. On the preoperative visit, the risks, benefits, indications, and alternatives of the procedure were reviewed with the patient.  On the day of surgery, the risks of surgery were again discussed with the patient including but not limited to: bleeding which may require transfusion or reoperation; infection which may require antibiotics; injury to bowel, bladder, ureters or other surrounding organs; need for additional procedures; thromboembolic phenomenon, incisional problems and other postoperative/anesthesia complications. Written informed consent was obtained.    OPERATIVE FINDINGS: A 5 week size uterus with normal tubes and ovaries bilaterally.  ESTIMATED BLOOD LOSS: 100 ml FLUIDS:  As recorded URINE OUTPUT: As recorded SPECIMENS:  Uterus and cervix and portion of tubes sent to pathology COMPLICATIONS:  None immediate.  DESCRIPTION OF PROCEDURE:  The patient received intravenous antibiotics and had sequential compression devices applied to her lower extremities while in the preoperative area.  She was then taken to the operating room where general anesthesia was administered and was found to be adequate.  She was placed in the dorsal lithotomy position, and was prepped and draped  in a sterile manner.  A Foley catheter was inserted into her bladder and attached to constant drainage. After an adequate timeout was performed, attention was turned to her pelvis.  A weighted speculum was then placed in the vagina, and the  posterior lip of the cervix were grasped  with tenaculum.  The posterior cul de sac was then entered via sharp dissection without problems. Long bill weight speculum was placed. The anterior lip of the cervix was grasped with tenaculum. The cervix was then circumferentially incised, and the bladder was dissected off the pubocervical fascia anteriorly without complication.  The anterior cul-de-sac was then entered sharply without difficulty and a retractor was placed.   Zeplin clamps were then used to clamp the uterosacral ligaments on either side.  They were then cut and sutured ligated with 0 Vicryl.  Of note, all sutures used in this case were 0 Vicryl unless otherwise noted.   The cardinal ligaments were then clamped, cut and ligated. The uterine vessels and broad ligaments were then serially clamped with the Zeplin clamps, cut, and suture ligated on both sides.  The final pedicle involving the uteroovarian was clamped, cut, ligated with a free tie and then in a [place] fashion on each side. Excellent hemostasis was noted at this point.    The uterus was delivered and sent to pathology. Each tube was identified and grasped, clamped , cut and ligated with a free tie. Tubal portions were sent to pathology. These pedicles were then suture ligated to ensure hemostasis.  After completion of the hysterectomy, all pedicles from the uterosacral ligament to the cornua were examined hemostasis was confirmed.  The vaginal cuff was then closed with 2/0 Vicryl   All instruments were then removed from the pelvis.  The patient tolerated the procedure well.  All instruments, needles, and sponge counts  were correct x 2. The patient was taken to the recovery room in stable condition.     Arlina Robes, MD, [place] Attending [place], [facility]

## 2022-09-30 NOTE — Anesthesia Procedure Notes (Signed)
Procedure Name: Intubation Date/Time: 09/30/2022 7:58 AM  Performed by: Timoteo Expose, CRNAPre-anesthesia Checklist: Patient identified, Emergency Drugs available, Suction available and Patient being monitored Patient Re-evaluated:Patient Re-evaluated prior to induction Oxygen Delivery Method: Circle system utilized Preoxygenation: Pre-oxygenation with 100% oxygen Induction Type: IV induction Ventilation: Mask ventilation without difficulty Laryngoscope Size: Mac and 3 Grade View: Grade I Tube type: Oral Number of attempts: 1 Airway Equipment and Method: Stylet and Oral airway Placement Confirmation: ETT inserted through vocal cords under direct vision, positive ETCO2 and breath sounds checked- equal and bilateral Secured at: 21 cm Tube secured with: Tape Dental Injury: Teeth and Oropharynx as per pre-operative assessment

## 2022-09-30 NOTE — Anesthesia Postprocedure Evaluation (Signed)
Anesthesia Post Note  Patient: Summer Brewer  Procedure(s) Performed: HYSTERECTOMY VAGINAL WITH BILATERAL SALPINGECTOMY (Bilateral: Uterus)     Patient location during evaluation: PACU Anesthesia Type: General Level of consciousness: awake and alert Pain management: pain level controlled Vital Signs Assessment: post-procedure vital signs reviewed and stable Respiratory status: spontaneous breathing, nonlabored ventilation and respiratory function stable Cardiovascular status: blood pressure returned to baseline Postop Assessment: no apparent nausea or vomiting Anesthetic complications: no   No notable events documented.  Last Vitals:  Vitals:   09/30/22 1100 09/30/22 1131  BP: 128/74 124/70  Pulse: 71 71  Resp:    Temp:    SpO2: 98% 95%    Last Pain:  Vitals:   09/30/22 1115  TempSrc:   PainSc: [place]

## 2022-09-30 NOTE — Interval H&P Note (Signed)
History and Physical Interval Note:  09/30/2022 7:19 AM  Patriot  has presented today for surgery, with the diagnosis of AUB Fibroids.  The various methods of treatment have been discussed with the patient and family. After consideration of risks, benefits and other options for treatment, the patient has consented to  Procedure(s): HYSTERECTOMY VAGINAL WITH SALPINGECTOMY (Bilateral) as a surgical intervention.  The patient's history has been reviewed, patient examined, no change in status, stable for surgery.  I have reviewed the patient's chart and labs.  Questions were answered to the patient's satisfaction.     Summer Brewer

## 2022-10-01 ENCOUNTER — Encounter (HOSPITAL_COMMUNITY): Payer: Self-pay | Admitting: Obstetrics and Gynecology

## 2022-10-01 DIAGNOSIS — D259 Leiomyoma of uterus, unspecified: Secondary | ICD-10-CM | POA: Diagnosis not present

## 2022-10-01 LAB — CBC
HCT: 35.7 % — ABNORMAL LOW (ref 36.0–46.0)
Hemoglobin: 11.5 g/dL — ABNORMAL LOW (ref 12.0–15.0)
MCH: 28 pg (ref 26.0–34.0)
MCHC: 32.2 g/dL (ref 30.0–36.0)
MCV: 86.9 fL (ref 80.0–100.0)
Platelets: 216 10*3/uL (ref 150–400)
RBC: 4.11 MIL/uL (ref 3.87–5.11)
RDW: 13.2 % (ref 11.5–15.5)
WBC: 13.1 10*3/uL — ABNORMAL HIGH (ref 4.0–10.5)
nRBC: 0 % (ref 0.0–0.2)

## 2022-10-01 LAB — BASIC METABOLIC PANEL
Anion gap: 12 (ref 5–15)
BUN: 22 mg/dL — ABNORMAL HIGH (ref 6–20)
CO2: 25 mmol/L (ref 22–32)
Calcium: 8.3 mg/dL — ABNORMAL LOW (ref 8.9–10.3)
Chloride: 99 mmol/L (ref 98–111)
Creatinine, Ser: 1.57 mg/dL — ABNORMAL HIGH (ref 0.44–1.00)
GFR, Estimated: 40 mL/min — ABNORMAL LOW (ref >60)
Glucose, Bld: 181 mg/dL — ABNORMAL HIGH (ref 70–99)
Potassium: 3.7 mmol/L (ref 3.5–5.1)
Sodium: 136 mmol/L (ref 135–145)

## 2022-10-01 LAB — HEMOGLOBIN AND HEMATOCRIT, BLOOD
HCT: 36.2 % (ref 36.0–46.0)
Hemoglobin: 11.8 g/dL — ABNORMAL LOW (ref 12.0–15.0)

## 2022-10-01 MED ORDER — LACTATED RINGERS IV BOLUS
500.0000 mL | Freq: Once | INTRAVENOUS | Status: AC
  Administered 2022-10-01: 500 mL via INTRAVENOUS

## 2022-10-01 MED ORDER — IBUPROFEN 800 MG PO TABS: 800.0000 mg | ORAL_TABLET | Freq: Three times a day (TID) | ORAL | 0 refills | Status: DC | PRN

## 2022-10-01 MED ORDER — OXYCODONE-ACETAMINOPHEN 5-325 MG PO TABS: 1.0000 | ORAL_TABLET | ORAL | 0 refills | Status: DC | PRN

## 2022-10-01 NOTE — Progress Notes (Signed)
Discharge instructions and prescriptions given to pt. Discussed post vaginal hysterectomy care, signs and symptoms to report to the MD, upcoming appointments, and meds. Pt verbalizes understanding and has no questions or concerns at this time. Pt discharged home from hospital in stable condition.

## 2022-10-01 NOTE — Discharge Summary (Signed)
Physician Discharge Summary  Patient ID: Summer Brewer MRN: [id number] DOB/AGE: May 10, 1971 52 y.o.  Admit date: 09/30/2022 Discharge date: 10/01/2022  Admission Diagnoses: Uterine Fibroids  Discharge Diagnoses:  Principal Problem:   Post-operative state   Discharged Condition: good  Hospital Course: Summer Brewer was admitted with the above Dx. She underwent TVH with BS without problems. See OP note for additional information. Post op course was unremarkable. Pt progressed to ambulating, voiding, tolerating diet and good oral pain control. Felt amendable for discharge home. Discharge instructions, medications and follow up reviewed. Pt verbalized understanding.  Consults: None  Significant Diagnostic Studies: labs:   Treatments: surgery: TVH/BS  Discharge Exam: Blood pressure (!) 138/94, pulse 81, temperature 97.6 F (36.4 C), temperature source Oral, resp. rate 18, height '5\' 7"'$  (1.702 m), weight 130.2 kg, last menstrual period 08/10/2022, SpO2 97 %. Lungs clear Heart RRR Abd soft + BS GU no bleeding Ext non tender   Disposition: Discharge disposition: 01-Home or Self Care       Discharge Instructions     Call MD for:  difficulty breathing, headache or visual disturbances   Complete by: As directed    Call MD for:  extreme fatigue   Complete by: As directed    Call MD for:  hives   Complete by: As directed    Call MD for:  persistant dizziness or light-headedness   Complete by: As directed    Call MD for:  persistant nausea and vomiting   Complete by: As directed    Call MD for:  redness, tenderness, or signs of infection (pain, swelling, redness, odor or green/yellow discharge around incision site)   Complete by: As directed    Call MD for:  severe uncontrolled pain   Complete by: As directed    Call MD for:  temperature >100.4   Complete by: As directed    Diet - low sodium heart healthy   Complete by: As directed    Increase activity slowly   Complete by:  As directed    Sexual Activity Restrictions   Complete by: As directed    Pelvic rest x 4 weeks      Allergies as of 10/01/2022       Reactions   Latex Hives, Itching        Medication List     STOP taking these medications    fluticasone 50 MCG/ACT nasal spray Commonly known as: FLONASE   tiZANidine 4 MG tablet Commonly known as: Zanaflex   triamcinolone cream 0.1 % Commonly known as: KENALOG       TAKE these medications    amLODipine 10 MG tablet Commonly known as: NORVASC TAKE 1 TABLET BY MOUTH EVERY DAY   cetirizine 10 MG tablet Commonly known as: ZYRTEC TAKE 1 TABLET BY MOUTH EVERY DAY   Emgality 120 MG/ML Soaj Generic drug: Galcanezumab-gnlm INJECT 240 MG INTO THE SKIN ONCE FOR 1 DOSE. LOADING DOSE   famotidine 20 MG tablet Commonly known as: PEPCID TAKE 1 TABLET (20 MG TOTAL) BY MOUTH 2 (TWO) TIMES DAILY AS NEEDED FOR HEARTBURN OR INDIGESTION.   hydrochlorothiazide 25 MG tablet Commonly known as: HYDRODIURIL TAKE 1 TABLET (25 MG TOTAL) BY MOUTH DAILY.   hydrOXYzine 10 MG tablet Commonly known as: ATARAX TAKE 1 TABLET (10 MG TOTAL) BY MOUTH AT BEDTIME AS NEEDED FOR ITCHING   ibuprofen 800 MG tablet Commonly known as: ADVIL Take 1 tablet (800 mg total) by mouth every 8 (eight) hours as needed. What changed:  medication strength how much to take when to take this reasons to take this   metoprolol tartrate 50 MG tablet Commonly known as: LOPRESSOR Take 1 tablet (50 mg total) by mouth 2 (two) times daily.   omeprazole 20 MG capsule Commonly known as: PRILOSEC TAKE 1 CAPSULE BY MOUTH EVERY DAY   ondansetron 4 MG tablet Commonly known as: Zofran Take 1 tablet (4 mg total) by mouth every 8 (eight) hours as needed for nausea or vomiting.   oxyCODONE-acetaminophen 5-325 MG tablet Commonly known as: PERCOCET/ROXICET Take 1 tablet by mouth every 4 (four) hours as needed for moderate pain.   rosuvastatin 5 MG tablet Commonly known as:  Crestor Take 1 tablet (5 mg total) by mouth daily.   sertraline 25 MG tablet Commonly known as: ZOLOFT TAKE 1 TABLET (25 MG TOTAL) BY MOUTH DAILY.   Ubrelvy 100 MG Tabs Generic drug: Ubrogepant TAKE 1 TABLET BY MOUTH AS NEEDED. MAY REPEAT IN 2 HOURS. MAXIMUM 2 TABLETS IN 24 HOURS        Follow-up Information     [facility]. Schedule an appointment as soon as possible for a visit in 4 week(s).   Why: 4 weeks face to face post op appt Contact information: [address] [place] [address] [phone]                Signed: [name] 10/01/2022, 8:05 AM

## 2022-10-02 ENCOUNTER — Telehealth: Payer: Self-pay

## 2022-10-02 NOTE — Telephone Encounter (Signed)
Transition Care Management Follow-up Telephone Call Date of discharge and from where: [facility] How have you been since you were released from the hospital? weak Any questions or concerns? No  Items Reviewed: Did the pt receive and understand the discharge instructions provided? Yes  Medications obtained and verified? Yes  Other? No  Any new allergies since your discharge? No  Dietary orders reviewed? Yes Do you have support at home? Yes   Home Care and Equipment/Supplies: Were home health services ordered? no If so, what is the name of the agency? N/a  Has the agency set up a time to come to the patient's home? not applicable Were any new equipment or medical supplies ordered?  No What is the name of the medical supply agency? N/a Were you able to get the supplies/equipment? not applicable Do you have any questions related to the use of the equipment or supplies? No  Functional Questionnaire: (I = Independent and D = Dependent) ADLs: I  Bathing/Dressing- D  Meal Prep- D  Eating- I  Maintaining continence- I  Transferring/Ambulation- I  Managing Meds- I  Follow up appointments reviewed:  PCP Hospital f/u appt confirmed? Yes  Scheduled to see Dr Jerilee Hoh on 10/09/2022 @ 3:30. [place] f/u appt confirmed? Yes  Scheduled to see Dr Rip Harbour on 10/29/2022 @ 1:30. Are transportation arrangements needed? No  If their condition worsens, is the pt aware to call PCP or go to the Emergency Dept.? Yes Was the patient provided with contact information for the PCP's office or ED? Yes Was to pt encouraged to call back with questions or concerns? Yes Juanda Crumble, LPN [place] Direct Dial [phone]

## 2022-10-02 NOTE — Progress Notes (Signed)
    SUBJECTIVE:   CHIEF COMPLAINT / HPI:   Hospital f/u: Underwent vaginal hysterectomy and bilateral salpingectomy due to AUB by Dr. Rip Harbour. Post-op diagnosis of uterine fibroids. No complications or concerns related to the procedure. Pain moderately controlled with Percocet, managed by OBGYN. Reports post-op constipation not relieved by Ex-lax. Reports decreased UOP post-op in the hospital that has since improved. Denies dysuria or back pain.  Snoring - STOP BANG: Do you snore loudly: Yes Do you often feel fatigued or tired or sleepy during the day time: Yes Has anyone observed you stop breathing at night: Yes Do you have high blood pressure: Yes BMI (>35 +1): 1 AGE (>50 +1): 0 Gender (Female +1): 0   Weight loss: Reports difficulty losing weight and states her weight has gone up despite her efforts. Requests injection medication for assistance.  Eczema: Reports she has a patch of eczema on her R shin that has not gone away with topical emollients. States it is very itchy and uncomfortable.   PERTINENT  PMH / PSH: AUB  OBJECTIVE:   BP 138/76   Pulse 66   Ht '5\' 7"'$  (1.702 m)   Wt 296 lb (134.3 kg)   LMP 08/10/2022 (Approximate)   SpO2 100%   BMI 46.36 kg/m    General: NAD, pleasant, able to participate in exam HEENT: Erythematous nasal turbinates. Possible asymmetric septum with stenosis on L nostril Cardiac: RRR, no murmurs. Respiratory: CTAB, normal effort, No wheezes, rales or rhonchi Abdomen: Bowel sounds present, nontender, nondistended Extremities: No edema or cyanosis. Skin: Warm and dry. Hyperpigmented and scaly patch on R shin Neuro: Alert, no obvious focal deficits Psych: Normal affect and mood  ASSESSMENT/PLAN:   Snoring Meets STOP-BANG criteria for sleep study due to concern for OSA. Referral sent.  Post-operative state S/p vaginal hysterectomy and bilateral salpingectomy due to AUB on 1/23. Pain moderately controlled with Percocet, OBGYN managing.  -Cr  1.57 post-op (baseline ~0.99) with low UOP in the hospital, now with normal UOP. Recheck BMP and UA to assess for persistent renal dysfunction -Hgb mildly decreased at 11.5 (baseline ~12-13), denies sx anemia. Recheck CBC to assess for persistent anemia -Persistent constipated post-op. Sent rx for Senna.  Eczema Itchy eczema patch on R shin. Refilled Triamcinolone ointment 0.5% BID and transition to daily when symptoms improved.  Morbid obesity with BMI of 45.0-49.9, adult ([place]) Requesting assistance with weight loss. Trial [id number] for management. Denies h/o pancreatitis and family hx papillary thyroid cancer.     Dr. Colletta Maryland, [place]

## 2022-10-04 ENCOUNTER — Encounter: Payer: Self-pay | Admitting: Obstetrics and Gynecology

## 2022-10-08 ENCOUNTER — Other Ambulatory Visit: Payer: Self-pay | Admitting: Obstetrics and Gynecology

## 2022-10-08 LAB — SURGICAL PATHOLOGY

## 2022-10-08 MED ORDER — OXYCODONE-ACETAMINOPHEN 5-325 MG PO TABS: 1.0000 | ORAL_TABLET | ORAL | 0 refills | Status: DC | PRN

## 2022-10-09 ENCOUNTER — Other Ambulatory Visit: Payer: Self-pay

## 2022-10-09 ENCOUNTER — Ambulatory Visit (INDEPENDENT_AMBULATORY_CARE_PROVIDER_SITE_OTHER): Payer: Commercial Managed Care - HMO | Admitting: Family Medicine

## 2022-10-09 ENCOUNTER — Encounter: Payer: Self-pay | Admitting: Family Medicine

## 2022-10-09 VITALS — BP 138/76 | HR 66 | Ht 67.0 in | Wt 296.0 lb

## 2022-10-09 DIAGNOSIS — R0683 Snoring: Secondary | ICD-10-CM

## 2022-10-09 DIAGNOSIS — Z9889 Other specified postprocedural states: Secondary | ICD-10-CM

## 2022-10-09 DIAGNOSIS — D649 Anemia, unspecified: Secondary | ICD-10-CM

## 2022-10-09 DIAGNOSIS — G4733 Obstructive sleep apnea (adult) (pediatric): Secondary | ICD-10-CM | POA: Insufficient documentation

## 2022-10-09 DIAGNOSIS — L309 Dermatitis, unspecified: Secondary | ICD-10-CM

## 2022-10-09 DIAGNOSIS — Z6841 Body Mass Index (BMI) 40.0 and over, adult: Secondary | ICD-10-CM

## 2022-10-09 DIAGNOSIS — K5900 Constipation, unspecified: Secondary | ICD-10-CM | POA: Diagnosis not present

## 2022-10-09 DIAGNOSIS — S37009A Unspecified injury of unspecified kidney, initial encounter: Secondary | ICD-10-CM | POA: Diagnosis not present

## 2022-10-09 DIAGNOSIS — G4730 Sleep apnea, unspecified: Secondary | ICD-10-CM | POA: Insufficient documentation

## 2022-10-09 MED ORDER — TRIAMCINOLONE ACETONIDE 0.5 % EX OINT: 1.0000 | TOPICAL_OINTMENT | Freq: Two times a day (BID) | CUTANEOUS | 4 refills | Status: DC

## 2022-10-09 MED ORDER — WEGOVY 0.25 MG/0.5ML ~~LOC~~ SOAJ: 0.2500 mg | SUBCUTANEOUS | 1 refills | Status: DC

## 2022-10-09 MED ORDER — SENNA 8.6 MG PO TABS: 1.0000 | ORAL_TABLET | Freq: Every day | ORAL | 1 refills | Status: AC

## 2022-10-09 NOTE — Patient Instructions (Addendum)
It was wonderful to see you today! Thank you for choosing [place].   Please bring ALL of your medications with you to every visit.   Today we talked about:  I hope you continue to feel better from your surgery on the 23rd! Try to be patient with your progress as it can take time to heal. I sent in a prescription for a stool softener (Senna) that can help with your constipation. We are also checking labs today to see if your anemic and if your kidney function has improved. I will follow up with you about these results I ordered the sleep study to assess for sleep apnea. I ordered the medication for weight loss [facility]). Be aware it can take time to obtain it due to supply issues. I sent in the medication for your eczema that can hopefully help alleviate your symptoms  Please follow up in 1 month   If you haven't already, sign up for My Chart to have easy access to your labs results, and communication with your primary care physician.   We are checking some labs today. If they are abnormal, I will call you. If they are normal, I will send you a MyChart message (if it is active) or a letter in the mail. If you do not hear about your labs in the next 2 weeks, please call the office.  Call the clinic at [phone] if your symptoms worsen or you have any concerns.  Please be sure to schedule follow up at the front desk before you leave today.   Colletta Maryland, DO Family Medicine

## 2022-10-09 NOTE — Assessment & Plan Note (Signed)
Requesting assistance with weight loss. Trial [id number] for management. Denies h/o pancreatitis and family hx papillary thyroid cancer.

## 2022-10-09 NOTE — Assessment & Plan Note (Addendum)
Meets STOP-BANG criteria for sleep study due to concern for OSA. Referral sent.

## 2022-10-09 NOTE — Assessment & Plan Note (Addendum)
S/p vaginal hysterectomy and bilateral salpingectomy due to AUB on 1/23. Pain moderately controlled with Percocet, OBGYN managing.  -Cr 1.57 post-op (baseline ~0.99) with low UOP in the hospital, now with normal UOP. Recheck BMP and UA to assess for persistent renal dysfunction -Hgb mildly decreased at 11.5 (baseline ~12-13), denies sx anemia. Recheck CBC to assess for persistent anemia -Persistent constipated post-op. Sent rx for Senna.

## 2022-10-09 NOTE — Assessment & Plan Note (Signed)
Itchy eczema patch on R shin. Refilled Triamcinolone ointment 0.5% BID and transition to daily when symptoms improved.

## 2022-10-10 ENCOUNTER — Encounter: Payer: Self-pay | Admitting: Family Medicine

## 2022-10-10 DIAGNOSIS — L309 Dermatitis, unspecified: Secondary | ICD-10-CM

## 2022-10-10 LAB — URINALYSIS
Bilirubin, UA: NEGATIVE
Glucose, UA: NEGATIVE
Ketones, UA: NEGATIVE
Nitrite, UA: NEGATIVE
Specific Gravity, UA: 1.021 (ref 1.005–1.030)
Urobilinogen, Ur: 1 mg/dL (ref 0.2–1.0)
pH, UA: 6 (ref 5.0–7.5)

## 2022-10-10 LAB — CBC
Hematocrit: 35.7 % (ref 34.0–46.6)
Hemoglobin: 11.7 g/dL (ref 11.1–15.9)
MCH: 27.1 pg (ref 26.6–33.0)
MCHC: 32.8 g/dL (ref 31.5–35.7)
MCV: 83 fL (ref 79–97)
Platelets: 241 x10E3/uL (ref 150–450)
RBC: 4.32 x10E6/uL (ref 3.77–5.28)
RDW: 12.1 % (ref 11.7–15.4)
WBC: 6.9 x10E3/uL (ref 3.4–10.8)

## 2022-10-10 LAB — BASIC METABOLIC PANEL WITH GFR
BUN/Creatinine Ratio: 12 (ref 9–23)
BUN: 16 mg/dL (ref 6–24)
CO2: 27 mmol/L (ref 20–29)
Calcium: 9.4 mg/dL (ref 8.7–10.2)
Chloride: 103 mmol/L (ref 96–106)
Creatinine, Ser: 1.29 mg/dL — ABNORMAL HIGH (ref 0.57–1.00)
Glucose: 85 mg/dL (ref 70–99)
Potassium: 4.2 mmol/L (ref 3.5–5.2)
Sodium: 145 mmol/L — ABNORMAL HIGH (ref 134–144)
eGFR: 51 mL/min/1.73 — ABNORMAL LOW

## 2022-10-25 ENCOUNTER — Other Ambulatory Visit: Payer: Self-pay | Admitting: Neurology

## 2022-10-27 ENCOUNTER — Other Ambulatory Visit: Payer: Self-pay | Admitting: Family Medicine

## 2022-10-27 ENCOUNTER — Encounter: Payer: Self-pay | Admitting: Family Medicine

## 2022-10-27 DIAGNOSIS — L309 Dermatitis, unspecified: Secondary | ICD-10-CM

## 2022-10-27 MED ORDER — MOUNJARO 2.5 MG/0.5ML ~~LOC~~ SOAJ: 2.5000 mg | SUBCUTANEOUS | 0 refills | Status: DC

## 2022-10-27 MED ORDER — TRIAMCINOLONE ACETONIDE 0.5 % EX CREA: TOPICAL_CREAM | Freq: Two times a day (BID) | CUTANEOUS | 2 refills | Status: DC | PRN

## 2022-10-29 ENCOUNTER — Encounter: Payer: Self-pay | Admitting: Obstetrics and Gynecology

## 2022-10-29 ENCOUNTER — Ambulatory Visit (INDEPENDENT_AMBULATORY_CARE_PROVIDER_SITE_OTHER): Payer: Commercial Managed Care - HMO | Admitting: Obstetrics and Gynecology

## 2022-10-29 VITALS — BP 132/85 | HR 89 | Ht 67.0 in | Wt 289.5 lb

## 2022-10-29 DIAGNOSIS — Z90710 Acquired absence of both cervix and uterus: Secondary | ICD-10-CM

## 2022-10-29 DIAGNOSIS — Z9889 Other specified postprocedural states: Secondary | ICD-10-CM

## 2022-10-29 NOTE — Progress Notes (Signed)
Post Op TVH with BS Reports "I'm really, really sore", cramping, back pain. Reports no VB but "a little something is coming out"

## 2022-10-29 NOTE — Progress Notes (Signed)
Summer Brewer presents for post op appt from [facility] with BS on 1/24 Pathology reviewed with pt. Pt reports normal post op discomfort No vaginal bleeding Denies any bowel or bladder dysfunction  PE AF VSS Chaperone present  Lungs clear Heart RRR Abd soft + BS  GU Nl EGBUS, cuff healing well, no masses  A/P Post op TVH/BS  Overall doing well. Continue to increase ADL's as tolerates. Continue with pelvic rest for an additional 2 weeks. Return to work note provided F/U PRN or in 1 yr

## 2022-11-10 ENCOUNTER — Encounter: Payer: Self-pay | Admitting: Family Medicine

## 2022-11-14 ENCOUNTER — Ambulatory Visit (INDEPENDENT_AMBULATORY_CARE_PROVIDER_SITE_OTHER): Payer: Commercial Managed Care - HMO | Admitting: Family Medicine

## 2022-11-14 ENCOUNTER — Encounter: Payer: Self-pay | Admitting: Family Medicine

## 2022-11-14 VITALS — BP 121/80 | HR 84 | Wt 288.8 lb

## 2022-11-14 DIAGNOSIS — Z6841 Body Mass Index (BMI) 40.0 and over, adult: Secondary | ICD-10-CM | POA: Diagnosis not present

## 2022-11-14 DIAGNOSIS — L309 Dermatitis, unspecified: Secondary | ICD-10-CM | POA: Diagnosis not present

## 2022-11-14 DIAGNOSIS — S39012A Strain of muscle, fascia and tendon of lower back, initial encounter: Secondary | ICD-10-CM

## 2022-11-14 DIAGNOSIS — R7989 Other specified abnormal findings of blood chemistry: Secondary | ICD-10-CM

## 2022-11-14 DIAGNOSIS — M545 Low back pain, unspecified: Secondary | ICD-10-CM

## 2022-11-14 MED ORDER — METHOCARBAMOL 500 MG PO TABS: 500.0000 mg | ORAL_TABLET | Freq: Every day | ORAL | 1 refills | Status: AC

## 2022-11-14 NOTE — Patient Instructions (Signed)
It was wonderful to see you today! Thank you for choosing [place].   Please bring ALL of your medications with you to every visit.   Today we talked about:  We did a skin biopsy to see what may be causing the spot on your R shin. I will follow up with you about these results. Please continue to not use the steroid cream pending these results. We are checking your kidney function again today. I will follow up with you about these results I prescribed a muscle relaxer to help with your back pain after your surgery. Please take it easy and slowly recover. Listen to your body and if the pain is getting worse please come back and see me   We are checking some labs today. If they are abnormal, I will call you. If they are normal, I will send you a MyChart message (if it is active) or a letter in the mail. If you do not hear about your labs in the next 2 weeks, please call the office.  Call the clinic at [phone] if your symptoms worsen or you have any concerns.  Please be sure to schedule follow up at the front desk before you leave today.   Colletta Maryland, DO Family Medicine

## 2022-11-14 NOTE — Assessment & Plan Note (Addendum)
Low back strain in the setting of over use. TVH on 09/30/2022, slow recovery. Trialed Tizanidine with dry mouth but muscle relaxer effective for discomfort when trying to sleep. -Trial Methocarbamol '500mg'$  qhs for back discomfort

## 2022-11-14 NOTE — Progress Notes (Signed)
    SUBJECTIVE:   CHIEF COMPLAINT / HPI:   Skin lesion Patient with ongoing skin lesion not improved with steroid cream. H/o eczema. States the lesion is itchy. Stopped steroid cream 2 weeks prior to appointment for skin biopsy.  Back pain Patient post-op from hysterectomy on 10/01/2022 and has been slowly recovering since that time. States she saw her OBGYN on 10/29/2022 who told her to resume all activity. She did a lot of activity since that time and the discomfort after her surgery came back. Feels like she strained her back and having difficulty sleeping due to discomfort. Tried to use Tizanidine but feels like it made her mouth   PERTINENT  PMH / PSH: Eczema, H/o TVVH  OBJECTIVE:   BP 121/80   Pulse 84   Wt 288 lb 12.8 oz (131 kg)   LMP 08/10/2022 (Approximate)   SpO2 98%   BMI 45.23 kg/m    General: NAD, pleasant, able to participate in exam Respiratory: normal effort on RA Extremities: no edema or cyanosis. Skin: warm and dry. 10cm x 4cm scaly, dry lesion on R shin. Psych: Normal affect and mood   ASSESSMENT/PLAN:   Dermatitis Chronic scaly dry lesion on R shin no relieved by steroid cream. Punch biopsy performed today. Patient off steroid cream x 2 weeks prior to biopsy. F/u results.  BACK PAIN, LUMBAR Low back strain in the setting of over use. TVH on 09/30/2022, slow recovery. Trialed Tizanidine with dry mouth but muscle relaxer effective for discomfort when trying to sleep. -Trial Methocarbamol 500mg  qhs for back discomfort  Elevated serum creatinine Cr 1.57 on 10/01/2022 immediate post-op from [facility]. Repeat Cr 1.29 on 10/09/2022. -Repeat BMP today to trend Cr  Morbid obesity with BMI of 45.0-49.9, adult ([place]) Difficulty obtaining GLP-1 for weight loss. Not interested in less efficacious options at this time.     Dr. Colletta Maryland, [place]

## 2022-11-14 NOTE — Assessment & Plan Note (Signed)
Difficulty obtaining GLP-1 for weight loss. Not interested in less efficacious options at this time.

## 2022-11-14 NOTE — Assessment & Plan Note (Signed)
Cr 1.57 on 10/01/2022 immediate post-op from [facility]. Repeat Cr 1.29 on 10/09/2022. -Repeat BMP today to trend Cr

## 2022-11-14 NOTE — Assessment & Plan Note (Signed)
Chronic scaly dry lesion on R shin no relieved by steroid cream. Punch biopsy performed today. Patient off steroid cream x 2 weeks prior to biopsy. F/u results.

## 2022-11-15 LAB — BASIC METABOLIC PANEL
BUN/Creatinine Ratio: 20 (ref 9–23)
BUN: 25 mg/dL — ABNORMAL HIGH (ref 6–24)
CO2: 27 mmol/L (ref 20–29)
Calcium: 9.3 mg/dL (ref 8.7–10.2)
Chloride: 97 mmol/L (ref 96–106)
Creatinine, Ser: 1.24 mg/dL — ABNORMAL HIGH (ref 0.57–1.00)
Glucose: 80 mg/dL (ref 70–99)
Potassium: 3.4 mmol/L — ABNORMAL LOW (ref 3.5–5.2)
Sodium: 140 mmol/L (ref 134–144)
eGFR: 53 mL/min/{1.73_m2} — ABNORMAL LOW (ref >59)

## 2022-11-17 ENCOUNTER — Ambulatory Visit (HOSPITAL_BASED_OUTPATIENT_CLINIC_OR_DEPARTMENT_OTHER): Payer: Commercial Managed Care - HMO | Attending: Family Medicine | Admitting: Internal Medicine

## 2022-11-17 DIAGNOSIS — G4733 Obstructive sleep apnea (adult) (pediatric): Secondary | ICD-10-CM | POA: Insufficient documentation

## 2022-11-17 DIAGNOSIS — R0683 Snoring: Secondary | ICD-10-CM

## 2022-11-17 DIAGNOSIS — I493 Ventricular premature depolarization: Secondary | ICD-10-CM | POA: Insufficient documentation

## 2022-11-21 ENCOUNTER — Telehealth: Payer: Self-pay | Admitting: Family Medicine

## 2022-11-21 DIAGNOSIS — L309 Dermatitis, unspecified: Secondary | ICD-10-CM

## 2022-11-21 DIAGNOSIS — R7989 Other specified abnormal findings of blood chemistry: Secondary | ICD-10-CM

## 2022-11-21 MED ORDER — HALOBETASOL PROPIONATE 0.05 % EX CREA: TOPICAL_CREAM | Freq: Two times a day (BID) | CUTANEOUS | 0 refills | Status: DC

## 2022-11-21 NOTE — Telephone Encounter (Signed)
Spoke with patient about lab results. 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 Negative for fungal hyphae. Discuss differential with patient and likely chronic dermatitis. Will trial Halobetasol cream BID x 2 weeks for symptoms relief. If not improving, consider referral to dermatology for additional management.  Discuss patient's elevated creatinine of 1.24, similar to lab work a month ago. Patient with previously elevated creatinine to ~1.2 x 2 reading a year ago as well. 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 Ordered urine protein:creatinine ratio, patient preferred to call to schedule lab visit. Will follow up with results at that time and consider starting medication.  Colletta Maryland, DO

## 2022-11-23 DIAGNOSIS — R0683 Snoring: Secondary | ICD-10-CM | POA: Diagnosis not present

## 2022-11-23 NOTE — Procedures (Signed)
       Patient Name: Summer Brewer, Summer Brewer Date: 11/17/2022 Gender: Female D.O.B: December 31, 1970 Age (years): 51 Referring Provider: Talbert Cage Height (inches): 68 Interpreting Physician: Baird Lyons MD, ABSM Weight (lbs): 288 RPSGT: Carolin Coy BMI: 24 MRN: [id number] Neck Size: 14.50  CLINICAL INFORMATION Sleep Study Type: NPSG Indication for sleep study: Daytime Fatigue, Depression, Hypertension, Morbid Obesity, Obesity, Snoring Epworth Sleepiness Score: 6  SLEEP STUDY TECHNIQUE As per the AASM Manual for the Scoring of Sleep and Associated Events v2.3 (April 2016) with a hypopnea requiring 4% desaturations.  The channels recorded and monitored were frontal, central and occipital EEG, electrooculogram (EOG), submentalis EMG (chin), nasal and oral airflow, thoracic and abdominal wall motion, anterior tibialis EMG, snore microphone, electrocardiogram, and pulse oximetry.  MEDICATIONS Medications self-administered by patient taken the night of the study : [place] The study was initiated at 10:36:18 PM and ended at 4:47:57 AM.  Sleep onset time was 82.6 minutes and the sleep efficiency was 58.5%. The total sleep time was 217.5 minutes.  Stage REM latency was 210.5 minutes.  The patient spent 9.9% of the night in stage N1 sleep, 77.9% in stage N2 sleep, 0.0% in stage N3 and 12.2% in REM.  Alpha intrusion was absent.  Supine sleep was 9.26%.  RESPIRATORY PARAMETERS The overall apnea/hypopnea index (AHI) was 29.5 per hour. There were 16 total apneas, including 8 obstructive, 4 central and 4 mixed apneas. There were 91 hypopneas and 29 RERAs.  The AHI during Stage REM sleep was 54.3 per hour.  AHI while supine was 38.7 per hour.  The mean oxygen saturation was 92.6%. The minimum SpO2 during sleep was 77.0%.  soft snoring was noted during this study.  CARDIAC DATA The 2 lead EKG demonstrated sinus rhythm. The mean heart rate was 69.1  beats per minute. Other EKG findings include: PVCs.  LEG MOVEMENT DATA The total PLMS were 0 with a resulting PLMS index of 0.0. Associated arousal with leg movement index was 0.0 .  IMPRESSIONS - Moderate obstructive sleep apnea occurred during this study (AHI = 29.5/h). - No significant central sleep apnea occurred during this study (CAI = 1.1/h). - Moderate oxygen desaturation was noted during this study (Min O2 = 77.0%, Mean 92.6%). - The patient snored with soft snoring volume. - EKG findings include PVCs. - Clinically significant periodic limb movements did not occur during sleep. No significant associated arousals.  DIAGNOSIS - Obstructive Sleep Apnea (G47.33)  RECOMMENDATIONS - Suggest CPAP titration sleep study or autopap. Other options would be based on clinical judgment. - Be careful with alcohol, sedatives and other CNS depressants that may worsen sleep apnea and disrupt normal sleep architecture. - Sleep hygiene should be reviewed to assess factors that may improve sleep quality. - Weight management and regular exercise should be initiated or continued if appropriate.  [Electronically signed] 11/23/2022 10:22 AM  Baird Lyons MD, ABSM Diplomate, American Board of Sleep Medicine NPI: [id number]                      [place], [place] [facility]  ELECTRONICALLY SIGNED ON:  11/23/2022, 10:20 AM [place] PH: [phone]   FX: [phone] [place]

## 2022-11-25 ENCOUNTER — Ambulatory Visit: Payer: Medicaid Other | Admitting: Neurology

## 2022-12-10 ENCOUNTER — Other Ambulatory Visit: Payer: Medicaid Other

## 2022-12-11 ENCOUNTER — Encounter: Payer: Self-pay | Admitting: Obstetrics and Gynecology

## 2022-12-15 ENCOUNTER — Other Ambulatory Visit: Payer: Medicaid Other

## 2022-12-22 ENCOUNTER — Telehealth: Payer: Self-pay

## 2022-12-22 ENCOUNTER — Other Ambulatory Visit: Payer: Medicaid Other

## 2022-12-22 NOTE — Telephone Encounter (Signed)
PA request received via CMM for Emgality 120MG /ML auto-injectors (migraine)  PA has been submitted to [employer] and is pending additional questions/determination  Key: [id number]

## 2022-12-24 ENCOUNTER — Encounter: Payer: Self-pay | Admitting: Family Medicine

## 2022-12-24 ENCOUNTER — Encounter: Payer: Self-pay | Admitting: Obstetrics and Gynecology

## 2022-12-26 NOTE — Telephone Encounter (Signed)
No answer. Not connected.

## 2022-12-26 NOTE — Telephone Encounter (Signed)
Patient Advocate Encounter  Prior Authorization for Emgality /ML auto-injectors (migraine) has been approved through [employer].  Key: [id number]    Effective: 12-22-2022 to 12-22-2023

## 2022-12-29 ENCOUNTER — Other Ambulatory Visit (HOSPITAL_COMMUNITY): Payer: Self-pay

## 2023-01-02 ENCOUNTER — Other Ambulatory Visit (HOSPITAL_COMMUNITY): Payer: Self-pay

## 2023-01-06 ENCOUNTER — Encounter: Payer: Self-pay | Admitting: Family Medicine

## 2023-01-26 NOTE — Telephone Encounter (Signed)
Patient is calling again for lost sample patient is upset. Recently transferred her to Jessica's VM, I sent Shanda Bumps a message she said she would respond when she's back in office.   Thanks!

## 2023-01-27 ENCOUNTER — Telehealth: Payer: Self-pay | Admitting: *Deleted

## 2023-01-27 NOTE — Telephone Encounter (Signed)
Returned call to patient.  Apologized for the delay in getting back with her and advised that it was not how I / nor [facility] wanted to be perceived in general.  Patient was unhappy from beginning of the conversation.  Asked her to tell me what happened.  She reports that she came in and checked in, waited a really long time, but was finally given a cup to urinate in by [place].  She never heard anything back from [facility] or [place].  As far as I can tell the sample was never processed ( never released) and was likely destroyed at the end of the day.  I attempted to apologize to the patient and explained what I thought likely happened was that it was overlooked by [facility] employee.  I empathized with the patient especially how she feels that her "health was not taken seriously". Unfortunately, by this time patient could not be reassured ( understandably so) at which point she asked for my supervisors name.  Martha's name given to patient. Jone Baseman, CMA

## 2023-02-03 ENCOUNTER — Telehealth: Payer: Self-pay | Admitting: Family Medicine

## 2023-02-03 ENCOUNTER — Encounter: Payer: Self-pay | Admitting: Family Medicine

## 2023-02-03 DIAGNOSIS — M545 Low back pain, unspecified: Secondary | ICD-10-CM

## 2023-02-03 NOTE — Telephone Encounter (Signed)
HIPAA compliant callback message left.   I later realized that she was scheduled to be seen on ATC clinic tomorrow and not with Dr. Deirdre Priest.  Please advise patient Dr. She will see Dr. Melba Coon tomorrow for her appointment when she returns the call. Thanks.

## 2023-02-03 NOTE — Telephone Encounter (Signed)
I contacted the patient regarding the grievance issue. She confirmed her name and DOB. I introduced myself and Dr. Denny Levy, who was also present for the telephone call to her.  The patient expressed concerns about a lost urine sample and not receiving a callback for her sleep study report. The patient was understandably upset about the situation and expressed her desire to find a new practice. As discussed with her, I will further investigate the issue with her urine sample and see what we can do to improve the sample collection and storage process. I have met with our lab supervisor to discuss this issue and process improvement.   I informed her that her primary care provider is currently on night float, which has caused a significant change in her schedule. Hence, the reason for not hearing back from her sooner than anticipated.   Dr. Jennette Kettle and I discussed possibly transferring her from a resident PCP to a faculty member, Dr. Luanne Bras, instead of her leaving the practice. She agreed to this and is okay with waiting to be transferred to Dr. Luanne Bras in June.  In the meantime, I will enroll her as my primary care patient and arrange for her to transfer to Dr. Luanne Bras in June. She expressed gratitude for the call and the meeting's outcome.  NB: She has an upcoming appointment with Dr. Deirdre Priest tomorrow for repeat urine test and to discuss her sleep study report.  I will find time to debrief with Dr. Ardyth Harps once she is off night float schedule.

## 2023-02-03 NOTE — Telephone Encounter (Signed)
I called and notified her of her appointment on ATC tomorrow. She was appreciative of this. She is ok to be seen on ATC tomorrow.

## 2023-02-04 ENCOUNTER — Ambulatory Visit: Payer: Commercial Managed Care - HMO | Admitting: Family Medicine

## 2023-02-04 ENCOUNTER — Ambulatory Visit (INDEPENDENT_AMBULATORY_CARE_PROVIDER_SITE_OTHER): Payer: Commercial Managed Care - HMO | Admitting: Family Medicine

## 2023-02-04 ENCOUNTER — Other Ambulatory Visit: Payer: Commercial Managed Care - HMO

## 2023-02-04 ENCOUNTER — Other Ambulatory Visit: Payer: Self-pay

## 2023-02-04 VITALS — BP 128/80 | HR 87 | Ht 67.0 in | Wt 291.0 lb

## 2023-02-04 DIAGNOSIS — G4733 Obstructive sleep apnea (adult) (pediatric): Secondary | ICD-10-CM

## 2023-02-04 DIAGNOSIS — I10 Essential (primary) hypertension: Secondary | ICD-10-CM | POA: Diagnosis not present

## 2023-02-04 DIAGNOSIS — M545 Low back pain, unspecified: Secondary | ICD-10-CM | POA: Diagnosis not present

## 2023-02-04 NOTE — Patient Instructions (Addendum)
It was wonderful to see you today.  Please bring ALL of your medications with you to every visit.   Today we talked about:  We collected a urine sample to you.  I have ordered a CPAP titration study.  I have a referral to Physical Therapy for your back.   Thank you for coming to your visit as scheduled. We have had a large "no-show" problem lately, and this significantly limits our ability to see and care for patients. As a friendly reminder- if you cannot make your appointment please call to cancel. We do have a no show policy for those who do not cancel within 24 hours. Our policy is that if you miss or fail to cancel an appointment within 24 hours, 3 times in a 30-month period, you may be dismissed from our clinic.   Thank you for choosing [facility].   Please call [phone] with any questions about today's appointment.  Please be sure to schedule follow up at the front  desk before you leave today.   Sabino Dick, DO PGY-3 Family Medicine    Back Exercises The following exercises strengthen the muscles that help to support the trunk (torso) and back. They also help to keep the lower back flexible. Doing these exercises can help to prevent or lessen existing low back pain. If you have back pain or discomfort, try doing these exercises 2-3 times each day or as told by your health care provider. As your pain improves, do them once each day, but increase the number of times that you repeat the steps for each exercise (do more repetitions). To prevent the recurrence of back pain, continue to do these exercises once each day or as told by your health care provider. Do exercises exactly as told by your health care provider and adjust them as directed. It is normal to feel mild stretching, pulling, tightness, or discomfort as you do these exercises, but you should stop right away if you feel sudden pain or your pain gets worse. Exercises Single knee to chest Repeat  these steps 3-5 times for each leg: Lie on your back on a firm bed or the floor with your legs extended.

## 2023-02-04 NOTE — Progress Notes (Signed)
    SUBJECTIVE:   CHIEF COMPLAINT / HPI:   Summer Brewer is a 52 y.o. female who presents to the [facility] clinic today to discuss the following concerns:   She returns today for follow up. States that she was told she had CKD. Reports that her urine sample was lost at the last visit so she wanted that recollected today.   OSA Patient underwent a sleep study on 11/18/2022.  That showed moderate obstructive sleep apnea without significant central sleep apnea.  Was recommended for CPAP titration sleep study. She has not yet had this done.   HTN Taking Amlodipine 10 mg, HCTZ 25 mg, metoprolol 50 mg BID. She reports good compliance. She checks her home BP and reports they typically measure 120s systolic over 80s.   Back Pain Reports that after her hysterectomy post-surgical follow up she was advised she could resume regular activity. She reports that shortly afterwards she developed low back pain.   PERTINENT  PMH / PSH: Hypertension, GERD, anxiety, hyperlipidemia  OBJECTIVE:   BP (!) 154/98   Pulse 87   Ht 5\' 7"  (1.702 m)   Wt 291 lb (132 kg)   LMP 08/10/2022 (Approximate)   SpO2 100%   BMI 45.58 kg/m   Vitals:   02/04/23 0839 02/04/23 0851  BP: (!) 154/98 128/80  Pulse: 87   SpO2: 100%    General: NAD, pleasant, able to participate in exam Respiratory: normal effort Abdomen: Obese abdomen Back: No midline spine ttp, some paraspinal ttp bilateral lumbar spine. Normal AROM in flexion, extension, side bending and rotation. Able to get up from seated position independently. Ambulates well independently  Psych: Normal affect and mood  ASSESSMENT/PLAN:   1. OSA (obstructive sleep apnea) Discussed that her sleep study results did show sleep apnea.  She was recommended for CPAP titration study which she is amenable to.  I placed orders today, they should call her for an appointment. - Cpap titration; Future  2. Acute bilateral low back pain without sciatica Discussed most likely  MSK etiology.  Discussed that exercise, weight loss, stretching, rehab can all help with back pain. - Ambulatory referral to Physical Therapy -Alternate Tylenol/ibuprofen as needed  3. Primary hypertension Elevated on first check, improved with repeat.  Seems as though her home blood pressures are appropriate.  She is taking her medications as prescribed.  Reflected urine sample today. - Protein / creatinine ratio, urine  4. Class III Obesity Patient expressed desire for weight loss.  Her insurance does not cover GLP-1's. Discussed there are some oral options (phentermine/topiramate) but this could cause blood pressure elevation and would not be preferred given her HTN. Also discussed option for medical weight management clinic. She was not interested in this.   Patient did express some dissatisfaction with our office today.  She mentioned that she may be switching primary care offices. Discussed that she should do what she feels is best for her health and care. Discussed that her records could be faxed to new office should she decide to switch.    Sabino Dick, DO [facility]

## 2023-02-04 NOTE — Addendum Note (Signed)
Addended by: Janit Pagan T on: 02/04/2023 11:29 AM   Modules accepted: Orders

## 2023-02-04 NOTE — Telephone Encounter (Signed)
  I called to check on this patient after the visit.  She still endorsed dissatisfaction with the practice and felt like her concerns were not adequately addressed. She discussed her chronic back pain, which was not addressed. However, Dr. Melba Coon discussed this with her and placed a PT referral for her. It appears she has not had an X-ray done. I offered to add a Lumbar spine X-ray to her evaluation, and she agreed with that. She was instructed to go to [facility] anytime for her x-ray, and I will call her with her test results. She agreed with the plan and was appreciative of the call. She will let us know if she decides to switch from our clinic to a different one.

## 2023-02-06 LAB — PROTEIN / CREATININE RATIO, URINE
Creatinine, Urine: 431.6 mg/dL
Protein, Ur: 20.7 mg/dL
Protein/Creat Ratio: 48 mg/g creat (ref 0–200)

## 2023-02-07 ENCOUNTER — Encounter: Payer: Self-pay | Admitting: Family Medicine

## 2023-02-09 ENCOUNTER — Telehealth: Payer: Self-pay | Admitting: Family Medicine

## 2023-02-09 NOTE — Telephone Encounter (Signed)
Tried to contact patient to set up appt but patient did not answer.

## 2023-02-09 NOTE — Telephone Encounter (Signed)
HIPAA compliant callback message left.  Need to discuss muscle relaxant.  Please help her schedule inperson or virtual appointment with me to discuss soon.

## 2023-02-17 ENCOUNTER — Ambulatory Visit: Payer: Commercial Managed Care - HMO | Admitting: Family Medicine

## 2023-02-18 ENCOUNTER — Other Ambulatory Visit: Payer: Self-pay | Admitting: Family Medicine

## 2023-02-18 ENCOUNTER — Encounter: Payer: Self-pay | Admitting: Family Medicine

## 2023-02-18 MED ORDER — PROMETHAZINE-DM 6.25-15 MG/5ML PO SYRP: 5.0000 mL | ORAL_SOLUTION | Freq: Three times a day (TID) | ORAL | 0 refills | Status: AC | PRN

## 2023-02-18 NOTE — Progress Notes (Deleted)
NEUROLOGY FOLLOW UP OFFICE NOTE  Summer Brewer [id number]  Assessment/Plan:   Migraine without aura, without status migrainosus, not intractable   Migraine prevention:  Emgality *** Migraine rescue:  Ubrelvy 100mg .  Continue Zofran 4mg  for nausea *** Limit use of pain relievers to no more than 2 days out of week to prevent risk of rebound or medication-overuse headache. Keep headache diary Discussed lifestyle modification Follow up 6 months ***       Subjective:  Summer Brewer is a 52 year old right-handed female with HTN who follows up for migraines.  UPDATE: Started Emgality ***  Intensity:  *** Duration:  *** Frequency:  *** Frequency of abortive medication: *** Current NSAIDS/analgesics:  none Current triptans:  none Current ergotamine:  none Current anti-emetic:  Zofran 4mg  Current muscle relaxants:  tizanidien 4mg  PRN Current Antihypertensive medications:  metoprolol, amlodipine, HCTZ Current Antidepressant medications:  sertaline 25mg  Current Anticonvulsant medications:  none Current anti-CGRP:  Emgality, Ubrelvy 100mg  Current Vitamins/Herbal/Supplements:  none Current Antihistamines/Decongestants:  cetirizine, Flonase, hydroxyzine Other therapy:  none     Caffeine:  rarely coffee Diet:  Drinks Sprite.  Does not drink water.  Sometimes skips breakfast Exercise:  no Depression:  no; Anxiety:  some Other pain:  no Sleep hygiene:  poor.  Trouble falling asleep.  Sleeps no more than 5 hours a night.  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  HISTORY:  Onset:  in her 35s Location:  left frontal/occipital Quality:  pounding Intensity:  severe.   Aura:  absent Prodrome:  absent Associated symptoms:  Nausea, sometimes vomiting, numbness on back of head, photophobia, phonophobia, sees floaters.  She denies associated unilateral numbness or weakness. Duration:  2 days Frequency:  3 times a month (total 6 headache days a  month) Frequency of abortive medication: three times a month Triggers:  Possibly perfumes Relieving factors:  rest in dark and quiet room Activity:  aggravates   MRI of brain without contrast on 11/28/2016  showed evidence of pansinusitis but no abnormal intracranial abnormality.   Past NSAIDS/analgesics:  diclofenac, meloxicam, naparoxen, ibuprofen, tramadol Past abortive triptans:  sumatriptan tab, eletriptan Past abortive ergotamine:  none Past muscle relaxants:  cyclobenzaprine Past anti-emetic:  metoclopramide Past antihypertensive medications:  propranolol, lisinopril, losartam Past antidepressant medications:  venlafaxine, dozepin, escitalopram, fluoxetine, citalopram  Past anticonvulsant medications:  topiramate Past anti-CGRP:  none Other past therapies:  none    Family history of headache:  no  PAST MEDICAL HISTORY: Past Medical History:  Diagnosis Date   Anemia    Anxiety    Back pain    Eczema    Fibroid    GERD (gastroesophageal reflux disease)    Hypertension    Migraines    Preterm labor    Umbilical hernia    Vaginal Pap smear, abnormal     MEDICATIONS: Current Outpatient Medications on File Prior to Visit  Medication Sig Dispense Refill   amLODipine (NORVASC) 10 MG tablet TAKE 1 TABLET BY MOUTH EVERY DAY 30 tablet 5   cetirizine (ZYRTEC) 10 MG tablet TAKE 1 TABLET BY MOUTH EVERY DAY 90 tablet 0   famotidine (PEPCID) 20 MG tablet TAKE 1 TABLET (20 MG TOTAL) BY MOUTH 2 (TWO) TIMES DAILY AS NEEDED FOR HEARTBURN OR INDIGESTION. 60 tablet 5   Galcanezumab-gnlm (EMGALITY) 120 MG/ML SOAJ INJECT 240 MG INTO THE SKIN ONCE FOR 1 DOSE. LOADING DOSE 1 mL 1   halobetasol (ULTRAVATE) 0.05 % cream Apply topically 2 (two)  times daily. 50 g 0   hydrochlorothiazide (HYDRODIURIL) 25 MG tablet TAKE 1 TABLET (25 MG TOTAL) BY MOUTH DAILY. 90 tablet 3   hydrOXYzine (ATARAX) 10 MG tablet TAKE 1 TABLET (10 MG TOTAL) BY MOUTH AT BEDTIME AS NEEDED FOR ITCHING 30 tablet 1    metoprolol tartrate (LOPRESSOR) 50 MG tablet Take 1 tablet (50 mg total) by mouth 2 (two) times daily. 180 tablet 3   rosuvastatin (CRESTOR) 5 MG tablet Take 1 tablet (5 mg total) by mouth daily. (Patient not taking: Reported on 02/04/2023) 90 tablet 3   sertraline (ZOLOFT) 25 MG tablet TAKE 1 TABLET (25 MG TOTAL) BY MOUTH DAILY. (Patient not taking: Reported on 02/04/2023) 90 tablet 1   triamcinolone cream (KENALOG) 0.5 % APPLY TOPICALLY 2 (TWO) TIMES DAILY AS NEEDED (ECZEMA, ITCHING). 15 g 2   UBRELVY 100 MG TABS TAKE 1 TABLET BY MOUTH AS NEEDED. MAY REPEAT IN 2 HOURS. MAXIMUM 2 TABLETS IN 24 HOURS 10 tablet 5   No current facility-administered medications on file prior to visit.    ALLERGIES: Allergies  Allergen Reactions   Latex Hives and Itching   Tizanidine Other (See Comments)    Mouth dryness    FAMILY HISTORY: Family History  Problem Relation Age of Onset   Hypertension Mother    Deep vein thrombosis Mother    Hypertension Father    Cancer - Prostate Father    Cancer Paternal Grandmother    Hypertension Other    Deep vein thrombosis Other    Leukemia Other    Colon cancer Neg Hx    Colon polyps Neg Hx    Esophageal cancer Neg Hx    Stomach cancer Neg Hx    Rectal cancer Neg Hx       Objective:  *** General: No acute distress.  Patient appears well-groomed.   Head:  Normocephalic/atraumatic Eyes:  Fundi examined but not visualized Neck: supple, no paraspinal tenderness, full range of motion Heart:  Regular rate and rhythm Neurological Exam: ***   Shon Millet, DO  CC: Janit Pagan, MD

## 2023-02-23 ENCOUNTER — Encounter: Payer: Self-pay | Admitting: Neurology

## 2023-02-23 ENCOUNTER — Ambulatory Visit: Payer: Medicaid Other | Admitting: Neurology

## 2023-03-06 ENCOUNTER — Encounter: Payer: Self-pay | Admitting: Student

## 2023-03-06 ENCOUNTER — Ambulatory Visit (INDEPENDENT_AMBULATORY_CARE_PROVIDER_SITE_OTHER): Payer: Commercial Managed Care - HMO | Admitting: Student

## 2023-03-06 ENCOUNTER — Other Ambulatory Visit: Payer: Self-pay

## 2023-03-06 ENCOUNTER — Ambulatory Visit (HOSPITAL_COMMUNITY)
Admission: RE | Admit: 2023-03-06 | Discharge: 2023-03-06 | Disposition: A | Discharge status: Home / Self Care | Payer: Commercial Managed Care - HMO | Source: Ambulatory Visit | Attending: Family Medicine | Admitting: Family Medicine

## 2023-03-06 VITALS — BP 141/99 | HR 90 | Ht 67.0 in | Wt 287.4 lb

## 2023-03-06 DIAGNOSIS — R55 Syncope and collapse: Secondary | ICD-10-CM | POA: Insufficient documentation

## 2023-03-06 NOTE — Patient Instructions (Signed)
It was wonderful to see you today. Thank you for allowing me to be a part of your care. Below is a short summary of what we discussed at your visit today:  Your symptoms are today consistent with syncope.  Unclear as to what is causing this.  But today we obtained EKG which was normal  We collected lab today to take your thyroid level, your electrolytes and your blood counts.  Please make sure you are drinking adequate water and staying hydrated.  Also for your stress I recommend seeking counseling.  You can contact your insurance providers to find counselors in your network.  Follow-up with your PCP in 2 to 3 weeks.   Please bring all of your medications to every appointment!  If you have any questions or concerns, please do not hesitate to contact us via phone or MyChart message.   Jerre Simon, MD [facility]

## 2023-03-06 NOTE — Progress Notes (Signed)
    SUBJECTIVE:   CHIEF COMPLAINT / HPI:   52 year old female presenting today for concerns of dizziness.Started about 4 weeks ago.Describes it as a whole body experience that includes dizziness, palpitations and feeling of body giving out. One episode during a funeral she almost fail. Patient felt like sounds was fading, got week and sat down in the chair and people had to fan her.She denies any loss of consciousness.  The symptoms are not daily but over the last week has been consistent intermittently.  She also endorses occasional feelings of room spinning but most of her symptoms lightheadedness mostly with standing.  States she is well-hydrated drinks water and a lot of the 0 drinks.  Denies having any chest pains, vision changes or extremity edema.  Describes herself as a generally anxious person and has been on as needed hydroxyzine but no longer on her Zoloft.  Also reports being generally more stressed this month mostly due to 2 deaths in a week in the family.  She has had no recent fall or trauma to the head.  Has recent illness in the last month where she was positive for COVID and strep and treated with Paxlovid.   PERTINENT  PMH / PSH: Reviewed  OBJECTIVE:   BP (!) 141/99   Pulse 90   Ht 5\' 7"  (1.702 m)   Wt 287 lb 6.4 oz (130.4 kg)   LMP 08/10/2022 (Approximate)   BMI 45.01 kg/m    Physical Exam General: Alert, well appearing, NAD Cardiovascular: RRR, No Murmurs, Normal S2/S2 Respiratory: CTAB, No wheezing or Rales Abdomen: No distension or tenderness Extremities: No edema on extremities   Neuro: No nerves II to XII intact, no focal neurologic deficit.  ASSESSMENT/PLAN:   Near syncope Unclear cause of patient's near syncope episodes.  Broad differential includes vasovagal, neurogenic etiology, orthostatics/dehydration, cardiac causes or panic attack.  EKG showed normal sinus rhythm and orthostatic vitals were negative.  Physical exam was unremarkable. Will obtain lab to  access for other possible etiologies. Also considering possibility of panic attack given report that patient generally feels anxious, hx of anxiety and has been more stressed in the last months due to 2 deaths in the family's.  This does not explain the intermittent dizziness with standing from sitting or laying position.  Low threshold to refer patient to cardiology as she could need Zio patch or echocardiogram to further evaluate her near syncope episodes. - Ordered labs for TSH, CBC, BMP -Obtained EKG -Performed orthostatic vitals -Reviewed fall precautions -Recommend continued as needed hydroxyzine use for anxiety -Follow-up with PCP in 2 to 3 weeks -Recommend echocardiogram, Zio patch should symptoms persist -Could consider cardiology referral     Jerre Simon, MD [facility]

## 2023-03-07 LAB — CBC
Hematocrit: 39.9 % (ref 34.0–46.6)
Hemoglobin: 12.5 g/dL (ref 11.1–15.9)
MCH: 26.4 pg — ABNORMAL LOW (ref 26.6–33.0)
MCHC: 31.3 g/dL — ABNORMAL LOW (ref 31.5–35.7)
MCV: 84 fL (ref 79–97)
Platelets: 215 10*3/uL (ref 150–450)
RBC: 4.73 x10E6/uL (ref 3.77–5.28)
RDW: 14.5 % (ref 11.7–15.4)
WBC: 5.2 10*3/uL (ref 3.4–10.8)

## 2023-03-07 LAB — BASIC METABOLIC PANEL
BUN/Creatinine Ratio: 13 (ref 9–23)
BUN: 14 mg/dL (ref 6–24)
CO2: 24 mmol/L (ref 20–29)
Calcium: 9.5 mg/dL (ref 8.7–10.2)
Chloride: 104 mmol/L (ref 96–106)
Creatinine, Ser: 1.11 mg/dL — ABNORMAL HIGH (ref 0.57–1.00)
Glucose: 96 mg/dL (ref 70–99)
Potassium: 3.8 mmol/L (ref 3.5–5.2)
Sodium: 145 mmol/L — ABNORMAL HIGH (ref 134–144)
eGFR: 61 mL/min/{1.73_m2} (ref >59)

## 2023-03-07 LAB — TSH: TSH: 1.7 u[IU]/mL (ref 0.450–4.500)

## 2023-03-09 DIAGNOSIS — I429 Cardiomyopathy, unspecified: Secondary | ICD-10-CM

## 2023-03-09 HISTORY — DX: Cardiomyopathy, unspecified: I42.9

## 2023-03-12 ENCOUNTER — Other Ambulatory Visit: Payer: Self-pay | Admitting: Family Medicine

## 2023-03-12 ENCOUNTER — Other Ambulatory Visit: Payer: Self-pay | Admitting: Obstetrics and Gynecology

## 2023-03-12 DIAGNOSIS — L309 Dermatitis, unspecified: Secondary | ICD-10-CM

## 2023-03-25 ENCOUNTER — Ambulatory Visit: Payer: Commercial Managed Care - HMO

## 2023-03-25 NOTE — Progress Notes (Deleted)
    SUBJECTIVE:   CHIEF COMPLAINT / HPI:   Dizziness Patient last seen on 03/06/2023 due to presyncopal episodes.  At that visit, CBC, BMP, TSH, EKG, orthostatic vitals all non concerning. Of note, patient takes hydroxyzine 10 mg***but has been taking this without issue since***  Today patient states these episodes have continued***times per week   PERTINENT  PMH / PSH: ***  OBJECTIVE:   LMP 08/10/2022 (Approximate)  ***  General: NAD, pleasant, able to participate in exam Cardiac: RRR, no murmurs. Respiratory: CTAB, normal effort, No wheezes, rales or rhonchi Abdomen: Bowel sounds present, nontender, nondistended, no hepatosplenomegaly. Extremities: no edema or cyanosis. Skin: warm and dry, no rashes noted Neuro: alert, no obvious focal deficits Psych: Normal affect and mood  ASSESSMENT/PLAN:   No problem-specific Assessment & Plan notes found for this encounter.     Dr. Erick Alley, DO [facility]    {    This will disappear when note is signed, click to select method of visit    :1}

## 2023-03-26 ENCOUNTER — Ambulatory Visit (INDEPENDENT_AMBULATORY_CARE_PROVIDER_SITE_OTHER): Payer: Commercial Managed Care - HMO | Admitting: Student

## 2023-03-26 ENCOUNTER — Other Ambulatory Visit: Payer: Self-pay

## 2023-03-26 VITALS — BP 125/86 | HR 89 | Ht 67.0 in | Wt 288.4 lb

## 2023-03-26 DIAGNOSIS — R55 Syncope and collapse: Secondary | ICD-10-CM | POA: Diagnosis not present

## 2023-03-26 DIAGNOSIS — R9431 Abnormal electrocardiogram [ECG] [EKG]: Secondary | ICD-10-CM

## 2023-03-26 NOTE — Patient Instructions (Signed)
It was great to see you! Thank you for allowing me to participate in your care!  I recommend that you always bring your medications to each appointment as this makes it easy to ensure you are on the correct medications and helps [place] not miss when refills are needed.  Our plans for today:  -I recommend going to seeing your neurologist on 7/30 for further evaluation -If symptoms persist, we can consider ordering a heart monitor.  We can also consider vestibular rehab -If you would like to discuss anxiety, please return -If you have an episode where you lose consciousness/pass out again, recommend going to the emergency department for evaluation   Take care and seek immediate care sooner if you develop any concerns.   Dr. Erick Alley, DO [facility]

## 2023-03-26 NOTE — Progress Notes (Signed)
    SUBJECTIVE:   CHIEF COMPLAINT / HPI:   Syncope Patient last seen on 03/06/2023 due to syncopal and presyncopal episodes.  At that visit, CBC, BMP, TSH, orthostatic vitals all non concerning.  EKG showed NSR with possible LVH.  Had one more severe episode where she fell at a funeral  about a month ago d/t the dizziness- felt muffled hearing, tunneled vision, fast heart rate, felt very hot (but it was very hot outside). Did not hit head. Everything went black. Was in a chair when she came to, unsure how long she was unconscious but not for long.   A few days before this episode had been feeling light headed, thinks d/t not eating much, not sleeping well d/t dealing a death of a family member.  Since the syncopal episode, wakes up feeling unwell daily. Has not felt like she may pass out again but feels dizzy, heart racing. These episodes occur multiple times per day, Feels like she is she is "drifting" when she is walking but feels fine when sitting. Symptoms not worse with head movement.  She is now sleeping well every night and eating well for past few weeks.   Is established with neurology for migraines. Does have apt with them to discuss this issue on 7/30. Migraines are currently well controlled.    PERTINENT  PMH / PSH: HTN, migraines, anxiety  OBJECTIVE:   BP 125/86   Pulse 89   Ht 5\' 7"  (1.702 m)   Wt 288 lb 6.4 oz (130.8 kg)   LMP 08/10/2022 (Approximate)   SpO2 97%   BMI 45.17 kg/m    General: NAD, pleasant, able to participate in exam Cardiac: RRR, no murmurs. Respiratory: CTAB, normal effort, No wheezes, rales or rhonchi Skin: warm and dry Neuro: alert, cranial nerves II through XII intact, sensation intact, finger-nose-finger test normal, negative Dix-Hallpike maneuver Psych: Normal affect and mood  ASSESSMENT/PLAN:   Syncope No further syncopal episodes since last month but still feeling dizzy, worse with standing.  Unclear etiology with basic labs and  orthostatic vitals negative at last visit.  Neuroexam reassuring and Dix-Hallpike maneuver negative for vertigo.  EKG with concern for LVH.  Patient agrees with echocardiogram to evaluate for structural heart abnormality and will follow-up with neurologist at scheduled appointment on 7/30.  Can also consider emotional distress and anxiety related to recent death of a loved one as contributing factor to her symptoms.  She declines therapy resources today. -Echocardiogram ordered -Consider Zio patch in future to rule out arrhythmias -Follow-up with neurology     Dr. Erick Alley, DO [facility]

## 2023-03-26 NOTE — Assessment & Plan Note (Signed)
No further syncopal episodes since last month but still feeling dizzy, worse with standing.  Unclear etiology with basic labs and orthostatic vitals negative at last visit.  Neuroexam reassuring and Dix-Hallpike maneuver negative for vertigo.  EKG with concern for LVH.  Patient agrees with echocardiogram to evaluate for structural heart abnormality and will follow-up with neurologist at scheduled appointment on 7/30.  Can also consider emotional distress and anxiety related to recent death of a loved one as contributing factor to her symptoms.  She declines therapy resources today. -Echocardiogram ordered -Consider Zio patch in future to rule out arrhythmias -Follow-up with neurology

## 2023-03-29 HISTORY — PX: TRANSTHORACIC ECHOCARDIOGRAM: SHX275

## 2023-04-06 NOTE — Progress Notes (Unsigned)
NEUROLOGY FOLLOW UP OFFICE NOTE  Summer Brewer [id number]  Assessment/Plan:   Migraine without aura, without status migrainosus, not intractable Dizziness/ataxia - will evaluate for intracranial etiology.  Continue cardiac workup as per PCP   MRI of brain without contrast to evaluate for intracranial cause for ataxia Migraine prevention:  Will change Emgality to another CGRP inhibitor.  Cannot take Aimovig due to HTN.  She will contact her insurance to inquire coverage for Ajovy, Vyepti or Qulipta. Migraine rescue:  Stop Ubrelvy.  She will try samples of Nurtec; Zofran 4mg  for nausea  Limit use of pain relievers to no more than 2 days out of week to prevent risk of rebound or medication-overuse headache. Keep headache diary Discussed lifestyle modification Follow up 6 months       Subjective:  Summer Brewer is a 52 year old right-handed female with HTN who follows up for migraines.  UPDATE: Started Emgality.  Headaches improved initially helpful but have gotten worse (more frequent). Intensity:  severe Duration:  35-40 minutes with Bernita Raisin.  However, she gets ringing in the ears whenever she takes the [place]. Frequency:  Varies.  8 days a month Current NSAIDS/analgesics:  none Current triptans:  none Current ergotamine:  none Current anti-emetic:  Zofran 4mg  Current muscle relaxants:  tizanidien 4mg  PRN Current Antihypertensive medications:  metoprolol, amlodipine, HCTZ Current Antidepressant medications:  sertaline 25mg  Current Anticonvulsant medications:  none Current anti-CGRP:  Emgality, Ubrelvy 100mg   Current Vitamins/Herbal/Supplements:  none Current Antihistamines/Decongestants:  cetirizine, Flonase, hydroxyzine Other therapy:  none  In June, she had two close deaths.  While at one of the funerals, she had a syncopal episode (lightheadedness, tunnel vision, muffled hearing).  At this time, she also contracted COVID.  Since then, she has had episodic dizzy  spells.  It is not positional.  It is a lightheadedness and also spinning sensation.  If she walks, it may cause her to veer to either side.  EKG demonstrated concern for LVH.  Echocardiogram is ordered.  Sometimes notes palpitations.  Zio patch has been considered as well.     Caffeine:  rarely coffee Diet:  Drinks Sprite.  Does not drink water.  Sometimes skips breakfast Exercise:  no Depression:  no; Anxiety:  some Other pain:  no Sleep hygiene:  poor.  Trouble falling asleep.  Sleeps no more than 5 hours a night.  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  HISTORY:  Onset:  in her 54s Location:  left frontal/occipital Quality:  pounding Intensity:  severe.   Aura:  absent Prodrome:  absent Associated symptoms:  Nausea, sometimes vomiting, numbness on back of head, photophobia, phonophobia, sees floaters.  She denies associated unilateral numbness or weakness. Duration:  2 days Frequency:  3 times a month (total 6 headache days a month) Frequency of abortive medication: three times a month Triggers:  Possibly perfumes Relieving factors:  rest in dark and quiet room Activity:  aggravates   MRI of brain without contrast on 11/28/2016 showed evidence of pansinusitis but no abnormal intracranial abnormality.   Past NSAIDS/analgesics:  diclofenac, meloxicam, naparoxen, ibuprofen, tramadol Past abortive triptans:  sumatriptan tab, eletriptan Past abortive ergotamine:  none Past muscle relaxants:  cyclobenzaprine Past anti-emetic:  metoclopramide Past antihypertensive medications:  propranolol, lisinopril, losartam Past antidepressant medications:  venlafaxine, dozepin, escitalopram, fluoxetine, citalopram  Past anticonvulsant medications:  topiramate Past anti-CGRP:  none Other past therapies:  none    Family history of headache:  no  PAST MEDICAL  HISTORY: Past Medical History:  Diagnosis Date   Anemia    Anxiety    Back pain    Eczema    Fibroid    GERD  (gastroesophageal reflux disease)    Hypertension    Migraines    Preterm labor    Umbilical hernia    Vaginal Pap smear, abnormal     MEDICATIONS: Current Outpatient Medications on File Prior to Visit  Medication Sig Dispense Refill   amLODipine (NORVASC) 10 MG tablet TAKE 1 TABLET BY MOUTH EVERY DAY 30 tablet 5   cetirizine (ZYRTEC) 10 MG tablet TAKE 1 TABLET BY MOUTH EVERY DAY 90 tablet 0   famotidine (PEPCID) 20 MG tablet TAKE 1 TABLET (20 MG TOTAL) BY MOUTH 2 (TWO) TIMES DAILY AS NEEDED FOR HEARTBURN OR INDIGESTION. 60 tablet 5   Galcanezumab-gnlm (EMGALITY) 120 MG/ML SOAJ INJECT 240 MG INTO THE SKIN ONCE FOR 1 DOSE. LOADING DOSE 1 mL 1   halobetasol (ULTRAVATE) 0.05 % cream APPLY TOPICALLY TWICE A DAY 45 g 0   hydrochlorothiazide (HYDRODIURIL) 25 MG tablet TAKE 1 TABLET (25 MG TOTAL) BY MOUTH DAILY. 90 tablet 3   hydrOXYzine (ATARAX) 10 MG tablet TAKE 1 TABLET (10 MG TOTAL) BY MOUTH AT BEDTIME AS NEEDED FOR ITCHING 30 tablet 1   metoprolol tartrate (LOPRESSOR) 50 MG tablet Take 1 tablet (50 mg total) by mouth 2 (two) times daily. 180 tablet 3   rosuvastatin (CRESTOR) 5 MG tablet Take 1 tablet (5 mg total) by mouth daily. (Patient not taking: Reported on 02/04/2023) 90 tablet 3   sertraline (ZOLOFT) 25 MG tablet TAKE 1 TABLET (25 MG TOTAL) BY MOUTH DAILY. (Patient not taking: Reported on 02/04/2023) 90 tablet 1   UBRELVY 100 MG TABS TAKE 1 TABLET BY MOUTH AS NEEDED. MAY REPEAT IN 2 HOURS. MAXIMUM 2 TABLETS IN 24 HOURS (Patient not taking: Reported on 03/26/2023) 10 tablet 5   No current facility-administered medications on file prior to visit.    ALLERGIES: Allergies  Allergen Reactions   Latex Hives and Itching   Tizanidine Other (See Comments)    Mouth dryness    FAMILY HISTORY: Family History  Problem Relation Age of Onset   Hypertension Mother    Deep vein thrombosis Mother    Hypertension Father    Cancer - Prostate Father    Cancer Paternal Grandmother     Hypertension Other    Deep vein thrombosis Other    Leukemia Other    Colon cancer Neg Hx    Colon polyps Neg Hx    Esophageal cancer Neg Hx    Stomach cancer Neg Hx    Rectal cancer Neg Hx       Objective:  Blood pressure (!) 153/87, pulse 78, height 5\' 7"  (1.702 m), weight 288 lb 9.6 oz (130.9 kg), last menstrual period 08/10/2022. General: No acute distress.  Patient appears well-groomed.   Head:  Normocephalic/atraumatic Eyes:  Fundi examined but not visualized Neck: supple, no paraspinal tenderness, full range of motion Heart:  Regular rate and rhythm Lungs:  Clear to auscultation bilaterally Back: No paraspinal tenderness Neurological Exam: alert and oriented.  Speech fluent and not dysarthric, language intact.  CN II-XII intact. Bulk and tone normal, muscle strength 5/5 throughout.  Sensation to light touch intact.  Deep tendon reflexes 2+ throughout, toes downgoing.  Finger to nose testing intact.  Gait mildly broad-based.  Romberg negative.   Shon Millet, DO  CC: Ellwood Dense, DO

## 2023-04-07 ENCOUNTER — Encounter: Payer: Self-pay | Admitting: Neurology

## 2023-04-07 ENCOUNTER — Ambulatory Visit (INDEPENDENT_AMBULATORY_CARE_PROVIDER_SITE_OTHER): Payer: Commercial Managed Care - HMO | Admitting: Neurology

## 2023-04-07 VITALS — BP 153/87 | HR 78 | Ht 67.0 in | Wt 288.6 lb

## 2023-04-07 DIAGNOSIS — G43109 Migraine with aura, not intractable, without status migrainosus: Secondary | ICD-10-CM

## 2023-04-07 DIAGNOSIS — R270 Ataxia, unspecified: Secondary | ICD-10-CM | POA: Diagnosis not present

## 2023-04-07 MED ORDER — KETOROLAC TROMETHAMINE 60 MG/2ML IM SOLN
60.0000 mg | Freq: Once | INTRAMUSCULAR | Status: AC
Start: 2023-04-07 — End: 2023-04-07
  Administered 2023-04-07: 60 mg via INTRAMUSCULAR

## 2023-04-07 NOTE — Patient Instructions (Signed)
Contact your insurance to find out which of following medication is formulary:  Laurey Arrow and let me know At earliest onset of migraine, take Nurtec (one in 24 hours).  Let me know if effective or not MRI of brain Follow up 6 months.

## 2023-04-08 ENCOUNTER — Encounter: Payer: Self-pay | Admitting: Neurology

## 2023-04-08 ENCOUNTER — Ambulatory Visit (HOSPITAL_COMMUNITY)
Admission: RE | Admit: 2023-04-08 | Discharge: 2023-04-08 | Disposition: A | Discharge status: Home / Self Care | Payer: Commercial Managed Care - HMO | Source: Ambulatory Visit | Attending: Family Medicine | Admitting: Family Medicine

## 2023-04-08 DIAGNOSIS — R55 Syncope and collapse: Secondary | ICD-10-CM | POA: Diagnosis not present

## 2023-04-08 DIAGNOSIS — I5030 Unspecified diastolic (congestive) heart failure: Secondary | ICD-10-CM

## 2023-04-08 DIAGNOSIS — I517 Cardiomegaly: Secondary | ICD-10-CM

## 2023-04-08 DIAGNOSIS — R9431 Abnormal electrocardiogram [ECG] [EKG]: Secondary | ICD-10-CM

## 2023-04-08 LAB — ECHOCARDIOGRAM COMPLETE
Area-P 1/2: 4.68 cm2
Calc EF: 41.7 %
S' Lateral: 3.2 cm
Single Plane A2C EF: 41.7 %
Single Plane A4C EF: 41.8 %

## 2023-04-08 NOTE — Progress Notes (Signed)
Echocardiogram 2D Echocardiogram has been performed.  Summer Brewer 04/08/2023, 3:59 PM

## 2023-04-09 ENCOUNTER — Encounter: Payer: Self-pay | Admitting: Student

## 2023-04-09 ENCOUNTER — Telehealth: Payer: Self-pay | Admitting: Student

## 2023-04-09 ENCOUNTER — Other Ambulatory Visit: Payer: Self-pay | Admitting: Neurology

## 2023-04-09 NOTE — Progress Notes (Signed)
    SUBJECTIVE:   CHIEF COMPLAINT / HPI:   Syncope and dizziness D/t recent syncopal episode, dizziness, LVH on recent EKG, echo was ordered.  Echo 04/08/23 showed EF 40-45% and G1DD, mildly reduced RV systolic function.  Is still having the dizzy spells daily, like she is going to tip over. Lasts for a couple minutes at most. No more syncopal episodes. Does admit to palpitations but not at the same time as the dizzy spells. Says she is still generally feeling bad/tired. Does feel more fatigued and SOB with exertion. Is propping up with 3 pillows at night. Feels winded when lying flat. No PND.   PERTINENT  PMH / PSH: HTN, obesity, HLD, syncope  OBJECTIVE:   BP 124/82   Pulse 71   Wt 287 lb (130.2 kg)   LMP 08/10/2022 (Approximate)   SpO2 94%   BMI 44.95 kg/m    General: NAD, pleasant, able to participate in exam Cardiac: RRR, no murmurs. Respiratory: CTAB, normal effort, No wheezes, rales or rhonchi Extremities: no edema of BLEs Skin: warm and dry Neuro: alert, no obvious focal deficits Psych: Normal affect and mood  ASSESSMENT/PLAN:   Heart failure with mildly reduced ejection fraction (HFmrEF) (HCC) Has NYHA class II heart failure based on symptoms.  Discussed this new diagnosis thoroughly and answered all questions.  Unsure what is causing this heart failure at such a young age.  Risk factors include hypertension, obesity, HLD.  Will order heart monitor to look for any arrhythmias which could be associated with heart failure and causing dizzy spells.  Will start incorporating GDMT with SGLT2 and changing metoprolol to tartrate to succinate today.  She does not appear fluid overloaded on exam today. -Referral to cardiology placed -Zio patch ordered for 7 days -Jardiance 10 mg daily -D/C metoprolol tartrate.  Start metoprolol succinate 100 mg daily -If patient cannot get in with cardiology within a month, she can return for follow-up appointment, can consider initiating  spironolactone and/or an ARB.      Dr. Erick Alley, DO [facility]

## 2023-04-09 NOTE — Telephone Encounter (Signed)
Attempted to call patient to see if she is able to come in for ATC appointment tomorrow morning with me to discuss echo results. She did not pick up and I was unable to leave voice mail. I also sent a my chart message. Will ask front desk to call pt again sometime today.

## 2023-04-10 ENCOUNTER — Ambulatory Visit: Payer: Commercial Managed Care - HMO | Attending: Family Medicine

## 2023-04-10 ENCOUNTER — Other Ambulatory Visit: Payer: Self-pay | Admitting: Student

## 2023-04-10 ENCOUNTER — Ambulatory Visit (INDEPENDENT_AMBULATORY_CARE_PROVIDER_SITE_OTHER): Payer: Commercial Managed Care - HMO | Admitting: Student

## 2023-04-10 VITALS — BP 124/82 | HR 71 | Wt 287.0 lb

## 2023-04-10 DIAGNOSIS — I5022 Chronic systolic (congestive) heart failure: Secondary | ICD-10-CM | POA: Insufficient documentation

## 2023-04-10 DIAGNOSIS — I5042 Chronic combined systolic (congestive) and diastolic (congestive) heart failure: Secondary | ICD-10-CM | POA: Insufficient documentation

## 2023-04-10 DIAGNOSIS — I10 Essential (primary) hypertension: Secondary | ICD-10-CM

## 2023-04-10 DIAGNOSIS — R002 Palpitations: Secondary | ICD-10-CM

## 2023-04-10 DIAGNOSIS — R55 Syncope and collapse: Secondary | ICD-10-CM | POA: Diagnosis not present

## 2023-04-10 MED ORDER — EMPAGLIFLOZIN 10 MG PO TABS: 10.0000 mg | ORAL_TABLET | Freq: Every day | ORAL | 3 refills | Status: DC

## 2023-04-10 MED ORDER — METOPROLOL SUCCINATE ER 100 MG PO TB24: 100.0000 mg | ORAL_TABLET | Freq: Every day | ORAL | 3 refills | Status: DC

## 2023-04-10 NOTE — Progress Notes (Unsigned)
Enrolled patient for a 7 day Zio XT monitor to be mailed to patients home   DOD to read 

## 2023-04-10 NOTE — Patient Instructions (Addendum)
It was great to see you! Thank you for allowing me to participate in your care!  I recommend that you always bring your medications to each appointment as this makes it easy to ensure you are on the correct medications and helps [place] not miss when refills are needed.  Our plans for today:  -I sent a referral to cardiology.  If they cannot get you in within the next month, return to family medicine clinic for follow-up visit. -I sent a prescription for Jardiance to your pharmacy.  As we discussed,, and the side effects are increased risk of UTIs and vaginal yeast infections.  If you experience these, let us know. -I switched your prescription of metoprolol tartrate to metoprolol succinate.  You can pick this up at your pharmacy. -I ordered a Zio patch heart monitor.  This will be mailed to your home. -If you pass out, feel very short of breath, these are reasons to seek care at the emergency department  Take care and seek immediate care sooner if you develop any concerns.   Dr. Erick Alley, DO [facility]

## 2023-04-10 NOTE — Assessment & Plan Note (Addendum)
Has NYHA class II heart failure based on symptoms.  Discussed this new diagnosis thoroughly and answered all questions.  Unsure what is causing this heart failure at such a young age.  Risk factors include hypertension, obesity, HLD.  Will order heart monitor to look for any arrhythmias which could be associated with heart failure and causing dizzy spells.  Will start incorporating GDMT with SGLT2 and changing metoprolol to tartrate to succinate today.  She does not appear fluid overloaded on exam today. -Referral to cardiology placed -Zio patch ordered for 7 days -Jardiance 10 mg daily -D/C metoprolol tartrate.  Start metoprolol succinate 100 mg daily -If patient cannot get in with cardiology within a month, she can return for follow-up appointment, can consider initiating spironolactone and/or an ARB.

## 2023-04-14 ENCOUNTER — Telehealth: Payer: Self-pay

## 2023-04-14 ENCOUNTER — Other Ambulatory Visit (HOSPITAL_COMMUNITY): Payer: Self-pay

## 2023-04-14 NOTE — Telephone Encounter (Signed)
Pharmacy Patient Advocate Encounter   Received notification from Physician's Office that prior authorization for Jardiance 10mg  is required/requested.   Insurance verification completed.   The patient is insured through [employer] .   Per test claim: PA required; PA submitted to [employer] via CoverMyMeds Key/confirmation #/EOC [id number]. Status is pending.

## 2023-04-15 ENCOUNTER — Other Ambulatory Visit: Payer: Self-pay | Admitting: Family Medicine

## 2023-04-15 ENCOUNTER — Other Ambulatory Visit: Payer: Self-pay | Admitting: Neurology

## 2023-04-15 DIAGNOSIS — I5022 Chronic systolic (congestive) heart failure: Secondary | ICD-10-CM | POA: Diagnosis not present

## 2023-04-15 DIAGNOSIS — G43109 Migraine with aura, not intractable, without status migrainosus: Secondary | ICD-10-CM

## 2023-04-15 DIAGNOSIS — R002 Palpitations: Secondary | ICD-10-CM

## 2023-04-15 DIAGNOSIS — R55 Syncope and collapse: Secondary | ICD-10-CM | POA: Diagnosis not present

## 2023-04-15 MED ORDER — DAPAGLIFLOZIN PROPANEDIOL 5 MG PO TABS: 10.0000 mg | ORAL_TABLET | Freq: Every day | ORAL | 0 refills | Status: DC

## 2023-04-15 MED ORDER — METHYLPREDNISOLONE 4 MG PO TBPK
ORAL_TABLET | ORAL | 0 refills | Status: DC
Start: 2023-04-15 — End: 2023-04-27

## 2023-04-15 NOTE — Telephone Encounter (Signed)
Pharmacy Patient Advocate Encounter  Received notification from [employer] that Prior Authorization for JARDIANCE has been DENIED. Please advise how you'd like to proceed. Full denial letter will be uploaded to the media tab. See denial reason below.  REASON: There is nothing to support that the individual has had contraindication, or intolerance to the covered alternative, [place]. [place] Pepper is considered medically necessary for Heart Failure when used to reduce the risk of CV death, HHF, and urgent heart failure visits in adults with heart failure (included both reduced and preserved ejection fraction) and the individual meets the following criteria: Contraindication or intolerance to [place]. Continuation of SGLT-2 Inhibitors and SGLT-2 / Metformin Combination products is considered medically necessary when the above medical necessity criteria are met and there is documentation of beneficial response.   PA #/Case ID/Reference #: [id number]

## 2023-04-15 NOTE — Telephone Encounter (Signed)
Called patient and informed her of change on medication.  Patient is aware as she has spoken to her insurance company.    Patient was told that she will need to stay on Farxiga to see if it works for her.  If not the insurance company will approve Jardiance.  Patient had questions concerning W[place] and states that she sent a [facility] message to provider.  Informed patient that I would pass on message to provider and she will respond when she receives [facility] message.  Glennie Hawk, CMA

## 2023-04-16 ENCOUNTER — Other Ambulatory Visit: Payer: Self-pay | Admitting: Family Medicine

## 2023-04-16 MED ORDER — DAPAGLIFLOZIN PROPANEDIOL 10 MG PO TABS: 10.0000 mg | ORAL_TABLET | Freq: Every day | ORAL | 3 refills | Status: DC

## 2023-04-16 NOTE — Telephone Encounter (Signed)
Originally sent 5mg  tablets as was alerted in Epic that's what insurance would cover.  Sent Farxiga 10mg  tablet for 30 day supply to pharmacy.   Unclear what the conversation was about Wegovy, it's not mentioned in her last note and not on her current med list? Maybe Dr. Yetta Barre knows?

## 2023-04-16 NOTE — Telephone Encounter (Signed)
Spoke with patient. She reports she spoke with her insurance at length yesterday.   Insurance is only going to cover 1 tablet per day of Farxiga.   Can we send in [place] 10mg  vs 5mg  taking #2 per day?   Will forward to PCP.   She also reports she would like to try getting Wegovy approved again. She reports with her new diagnoses she should be approved according to her insurance company.   Advise we would need to resend in [facility].

## 2023-04-22 ENCOUNTER — Encounter: Payer: Self-pay | Admitting: Family Medicine

## 2023-04-22 MED ORDER — FLUCONAZOLE 150 MG PO TABS: 150.0000 mg | ORAL_TABLET | Freq: Once | ORAL | 0 refills | Status: DC

## 2023-04-27 ENCOUNTER — Ambulatory Visit (INDEPENDENT_AMBULATORY_CARE_PROVIDER_SITE_OTHER): Payer: Commercial Managed Care - HMO | Admitting: Family Medicine

## 2023-04-27 ENCOUNTER — Encounter: Payer: Self-pay | Admitting: Family Medicine

## 2023-04-27 VITALS — BP 131/78 | HR 65 | Ht 67.0 in | Wt 289.0 lb

## 2023-04-27 DIAGNOSIS — G4733 Obstructive sleep apnea (adult) (pediatric): Secondary | ICD-10-CM | POA: Diagnosis not present

## 2023-04-27 DIAGNOSIS — I5022 Chronic systolic (congestive) heart failure: Secondary | ICD-10-CM

## 2023-04-27 DIAGNOSIS — L309 Dermatitis, unspecified: Secondary | ICD-10-CM | POA: Diagnosis not present

## 2023-04-27 DIAGNOSIS — Z6841 Body Mass Index (BMI) 40.0 and over, adult: Secondary | ICD-10-CM

## 2023-04-27 LAB — POCT GLYCOSYLATED HEMOGLOBIN (HGB A1C): Hemoglobin A1C: 5.9 % — AB (ref 4.0–5.6)

## 2023-04-27 MED ORDER — SEMAGLUTIDE-WEIGHT MANAGEMENT 0.25 MG/0.5ML ~~LOC~~ SOAJ: 0.2500 mg | SUBCUTANEOUS | 0 refills | Status: DC

## 2023-04-27 NOTE — Assessment & Plan Note (Signed)
Await CPAP titration

## 2023-04-27 NOTE — Patient Instructions (Signed)
It was great to see you!  Our plans for today:  - We are referring you to dermatology. Let us know if you don't hear about an appointment soon.  - We sent wegovy to your pharmacy.  We are checking some labs today, we will release these results to your MyChart.  Take care and seek immediate care sooner if you develop any concerns.   Dr. Linwood Dibbles

## 2023-04-27 NOTE — Assessment & Plan Note (Signed)
Tolerating new meds, continue. Awaiting cardiology appt.

## 2023-04-27 NOTE — Assessment & Plan Note (Signed)
Will trial wegovy.

## 2023-04-27 NOTE — Assessment & Plan Note (Signed)
Prior punch biopsy consistent with atopy. Not relieved with high potency steroid cream and oral antihistamines. On steroid dose pack for migraines last week, no help either. Refer to derm for eval/amanagement, may need biologic.

## 2023-04-27 NOTE — Progress Notes (Signed)
   SUBJECTIVE:   CHIEF COMPLAINT / HPI:   OBESITY - previously prescribed wegovy but unable to get due to insurance wouldn't cover. Was told by pharmacist could likely get covered now due to new onset heart failure. Has never been on medication for weight loss before. No h/o diabetes, pancreatitis.   Heart failure - awaiting cardiology appt. Still with positional dizziness, feels like she is going to tip over. Still with orthopnea. No SOB. Had sleep study for apneic spells, going 9/9 for cpap. Tolerating new meds.  Migraines - recently finished steroid dose pack.   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 Still with itching, refractory to zyrtec, atarax, high potency steroid cream, emollient use.    OBJECTIVE:   BP 131/78   Pulse 65   Ht 5\' 7"  (1.702 m)   Wt 289 lb (131.1 kg)   LMP 08/10/2022 (Approximate)   SpO2 100%   BMI 45.26 kg/m   Gen: well appearing, in NAD Card: RRR Lungs: CTAB Ext: WWP, no edema.  Skin: ~5cm hyperpigmented area to R shin (see media tab). No overlying scale, erythema.   ASSESSMENT/PLAN:   Heart failure with mildly reduced ejection fraction (HFmrEF) (HCC) Tolerating new meds, continue. Awaiting cardiology appt.   Dermatitis Prior punch biopsy consistent with atopy. Not relieved with high potency steroid cream and oral antihistamines. On steroid dose pack for migraines last week, no help either. Refer to derm for eval/amanagement, may need biologic.  Sleep apnea Await CPAP titration  Morbid obesity with BMI of 45.0-49.9, adult (HCC) Will trial wegovy.   F/u 4 weeks.   [name], DO

## 2023-04-28 ENCOUNTER — Encounter: Payer: Self-pay | Admitting: Family Medicine

## 2023-04-28 NOTE — Telephone Encounter (Signed)
Wegovy. She has a recent diagnoses of heart failure and is hoping her insurance will approve now.

## 2023-04-29 ENCOUNTER — Other Ambulatory Visit (HOSPITAL_COMMUNITY): Payer: Self-pay

## 2023-04-30 ENCOUNTER — Telehealth: Payer: Self-pay

## 2023-04-30 ENCOUNTER — Other Ambulatory Visit (HOSPITAL_COMMUNITY): Payer: Self-pay

## 2023-04-30 NOTE — Telephone Encounter (Signed)
Pharmacy Patient Advocate Encounter   Received notification from Patient Advice Request messages that prior authorization for wegovy is required/requested.   Insurance verification completed.   The patient is insured through [employer] .   Per test claim: PA required; PA submitted to [facility] via [employer] PA FORM/ WEBSITE. Key/confirmation #/EOC [id number]. Status is pending

## 2023-04-30 NOTE — Telephone Encounter (Signed)
Pharmacy Patient Advocate Encounter  Received notification from [facility] that Prior Authorization for [facility] has been DENIED. Please advise how you'd like to proceed. Full denial letter will be uploaded to the media tab. See denial reason below.  Will need documentation of the following:

## 2023-05-01 ENCOUNTER — Other Ambulatory Visit: Payer: Self-pay | Admitting: Family Medicine

## 2023-05-01 DIAGNOSIS — I10 Essential (primary) hypertension: Secondary | ICD-10-CM

## 2023-05-04 NOTE — Telephone Encounter (Signed)
See mychart message.   Patient aware medication has been denied.

## 2023-05-05 ENCOUNTER — Encounter: Payer: Self-pay | Admitting: Family Medicine

## 2023-05-07 NOTE — Telephone Encounter (Signed)
Patient has had good effort with diet and exercise. Patient does not have contraindication to GLP-1 agonist such as medullary thyroid cancer and MEN2.  Patient will not use with other GKP-1 agonist.  Patient is not pregnant or lactating.

## 2023-05-08 ENCOUNTER — Encounter: Payer: Self-pay | Admitting: Family Medicine

## 2023-05-08 ENCOUNTER — Ambulatory Visit (INDEPENDENT_AMBULATORY_CARE_PROVIDER_SITE_OTHER): Payer: Commercial Managed Care - HMO | Admitting: Family Medicine

## 2023-05-08 VITALS — BP 122/81 | HR 76 | Wt 287.6 lb

## 2023-05-08 DIAGNOSIS — I10 Essential (primary) hypertension: Secondary | ICD-10-CM | POA: Diagnosis not present

## 2023-05-08 DIAGNOSIS — Z6841 Body Mass Index (BMI) 40.0 and over, adult: Secondary | ICD-10-CM

## 2023-05-08 DIAGNOSIS — D500 Iron deficiency anemia secondary to blood loss (chronic): Secondary | ICD-10-CM

## 2023-05-08 DIAGNOSIS — I5022 Chronic systolic (congestive) heart failure: Secondary | ICD-10-CM

## 2023-05-08 DIAGNOSIS — G4733 Obstructive sleep apnea (adult) (pediatric): Secondary | ICD-10-CM | POA: Diagnosis not present

## 2023-05-08 DIAGNOSIS — R42 Dizziness and giddiness: Secondary | ICD-10-CM

## 2023-05-08 MED ORDER — ONDANSETRON HCL 4 MG PO TABS: 4.0000 mg | ORAL_TABLET | Freq: Three times a day (TID) | ORAL | 0 refills | Status: DC | PRN

## 2023-05-08 MED ORDER — FAMOTIDINE 40 MG PO TABS: 40.0000 mg | ORAL_TABLET | Freq: Two times a day (BID) | ORAL | 5 refills | Status: DC

## 2023-05-08 NOTE — Patient Instructions (Addendum)
It was great to see you!  Our plans for today:  - Call you neurologist to schedule your MRI. - Go to your cardiology and neurology appointments as scheduled.  - We will fax your wegovy paperwork.  - We will check on the status of your dermatology referral and send a MyChart message with information where you can call to schedule. - f/u in 3 months.  Take care and seek immediate care sooner if you develop any concerns.   Dr. Linwood Dibbles

## 2023-05-08 NOTE — Progress Notes (Unsigned)
    SUBJECTIVE:   CHIEF COMPLAINT / HPI:   HFmrEF, HTN, obesity: - Medications: farxiga, metoprolol, hydrochlorothiazide, wegovy. Hasn't been able to get wegovy yet - Compliance: *** - Checking BP at home: yes, 120-130s/80s - Diet: *** - Exercise: *** - still with dizzy spells,  - now with leg swelling, months - OSA - has CPAP study scheduled for 9/9. - cards appt 10/2  *** Patient has had good effort with diet and exercise. Patient does not have contraindication to GLP-1 agonist such as medullary thyroid cancer and MEN2.  Patient will not use with other GLP-1 agonist.  Patient is not pregnant or lactating.      ***check with Melvenia Beam about Derm referral. Where to call.   DIZZINESS Duration: {Blank single:19197::"days","weeks","months"} Description of symptoms: off kilter Duration of episode: minutes Dizziness frequency: recurrent Provoking factors:  movement Aggravating factors:  {Blank single:19197::"none"} Triggered by rolling over in bed:  sometimes Triggered by bending over: yes Aggravated by head movement: no Aggravated by exertion, coughing, loud noises: no Recent head injury: no Recent or current viral symptoms: no History of vasovagal episodes: {Blank single:19197::"yes","no"} Nausea: yes Vomiting: no Tinnitus: no Hearing loss: no Aural fullness: no Headache:  has h/o migraines, unsure if contributing Photophobia/phonophobia:  photo Unsteady gait: {Blank single:19197::"yes","no"} Postural instability: {Blank single:19197::"yes","no"} Diplopia, dysarthria, dysphagia or weakness: no Related to exertion: {Blank single:19197::"yes","no"} Pallor: {Blank single:19197::"yes","no"} Diaphoresis: {Blank single:19197::"yes","no"} Dyspnea: no Chest pain: no   OBJECTIVE:   BP 122/81   Pulse 76   Wt 287 lb 9.6 oz (130.5 kg)   LMP 08/10/2022 (Approximate)   SpO2 97%   BMI 45.04 kg/m   ***  ASSESSMENT/PLAN:   No problem-specific Assessment & Plan notes  found for this encounter.     [name], DO

## 2023-05-11 DIAGNOSIS — R42 Dizziness and giddiness: Secondary | ICD-10-CM | POA: Insufficient documentation

## 2023-05-11 NOTE — Assessment & Plan Note (Signed)
Euvolemic. On appropriate GDMT. Trying to get [facility], will fill out PA. Awaiting establishment with Cardiology.

## 2023-05-11 NOTE — Assessment & Plan Note (Signed)
At goal. No changes.  

## 2023-05-11 NOTE — Assessment & Plan Note (Addendum)
With h/o syncope. With new onset heart failure, on GDMT, euvolemic, awaiting Cardiology appt, likely needs monitor, RRR today. BP at goal today, orthostatics negative. Also with h/o migraines, follows with Neurology. Per chart review, questioning IIH. Has MRI ordered, recommended calling to schedule. Neuro exam wnl today. H/o iron deficiency anemia, now s/p hysterectomy for fibroids, recent labs wnl.

## 2023-05-11 NOTE — Assessment & Plan Note (Signed)
Will fill out wegovy PA as above. Continue diet/exercise.

## 2023-05-11 NOTE — Assessment & Plan Note (Signed)
Awaiting CPAP study.

## 2023-05-12 ENCOUNTER — Encounter: Payer: Self-pay | Admitting: Family Medicine

## 2023-05-12 NOTE — Telephone Encounter (Signed)
New PA completed and placed form with updated OV note in fax pile.

## 2023-05-18 ENCOUNTER — Ambulatory Visit (HOSPITAL_BASED_OUTPATIENT_CLINIC_OR_DEPARTMENT_OTHER): Payer: Commercial Managed Care - HMO | Attending: Family Medicine | Admitting: Internal Medicine

## 2023-05-19 NOTE — Telephone Encounter (Signed)
Pharmacy Patient Advocate Encounter  Received notification from [facility] that Prior Authorization for [facility] has been APPROVED from 05/13/23 to 11/10/23

## 2023-06-02 MED ORDER — SEMAGLUTIDE-WEIGHT MANAGEMENT 0.5 MG/0.5ML ~~LOC~~ SOAJ: 0.5000 mg | SUBCUTANEOUS | 0 refills | Status: DC

## 2023-06-03 MED ORDER — SEMAGLUTIDE-WEIGHT MANAGEMENT 0.5 MG/0.5ML ~~LOC~~ SOAJ
0.5000 mg | SUBCUTANEOUS | 0 refills | Status: DC
  Filled 2023-06-05: qty 2, 28d supply, fill #0

## 2023-06-03 NOTE — Addendum Note (Signed)
Addended by: [name] on: 06/03/2023 09:05 AM   Modules accepted: Orders

## 2023-06-05 ENCOUNTER — Other Ambulatory Visit: Payer: Self-pay

## 2023-06-05 ENCOUNTER — Other Ambulatory Visit (HOSPITAL_BASED_OUTPATIENT_CLINIC_OR_DEPARTMENT_OTHER): Payer: Self-pay

## 2023-06-10 ENCOUNTER — Encounter: Payer: Self-pay | Admitting: Cardiology

## 2023-06-10 ENCOUNTER — Ambulatory Visit: Payer: Managed Care, Other (non HMO) | Attending: Cardiology | Admitting: Cardiology

## 2023-06-10 VITALS — BP 118/74 | HR 92 | Ht 67.0 in | Wt 284.0 lb

## 2023-06-10 DIAGNOSIS — Z6841 Body Mass Index (BMI) 40.0 and over, adult: Secondary | ICD-10-CM

## 2023-06-10 DIAGNOSIS — R55 Syncope and collapse: Secondary | ICD-10-CM

## 2023-06-10 DIAGNOSIS — I5022 Chronic systolic (congestive) heart failure: Secondary | ICD-10-CM | POA: Diagnosis not present

## 2023-06-10 DIAGNOSIS — I10 Essential (primary) hypertension: Secondary | ICD-10-CM

## 2023-06-10 DIAGNOSIS — E782 Mixed hyperlipidemia: Secondary | ICD-10-CM | POA: Diagnosis not present

## 2023-06-10 DIAGNOSIS — R072 Precordial pain: Secondary | ICD-10-CM | POA: Insufficient documentation

## 2023-06-10 DIAGNOSIS — I110 Hypertensive heart disease with heart failure: Secondary | ICD-10-CM | POA: Insufficient documentation

## 2023-06-10 DIAGNOSIS — G4733 Obstructive sleep apnea (adult) (pediatric): Secondary | ICD-10-CM

## 2023-06-10 DIAGNOSIS — I429 Cardiomyopathy, unspecified: Secondary | ICD-10-CM | POA: Insufficient documentation

## 2023-06-10 DIAGNOSIS — R42 Dizziness and giddiness: Secondary | ICD-10-CM

## 2023-06-10 MED ORDER — SPIRONOLACTONE 25 MG PO TABS: 25.0000 mg | ORAL_TABLET | Freq: Every day | ORAL | 6 refills | Status: DC

## 2023-06-10 MED ORDER — METOPROLOL TARTRATE 100 MG PO TABS: 100.0000 mg | ORAL_TABLET | Freq: Once | ORAL | 0 refills | Status: DC

## 2023-06-10 MED ORDER — VALSARTAN 40 MG PO TABS: 40.0000 mg | ORAL_TABLET | Freq: Every day | ORAL | 6 refills | Status: DC

## 2023-06-10 NOTE — Patient Instructions (Addendum)
Medication Instructions:  Stop taking Hydrochlorothiazide ( hydrochlorothiazide)      Stop taking Amlodipine 5 mg   After doing lab work--  Start taking Valsartan 40 mg  daily  Start taking Spironolactone 25 mg daily   *If you need a refill on your cardiac medications before your next appointment, please call your pharmacy*   Lab Work:  In one week next Thursday  06/18/23 CMP  Renin aldosterone     Recheck in 2- 3 weeks after starting  Valsartan  and Spironolactone  CMP   If you have labs (blood work) drawn today and your tests are completely normal, you will receive your results only by: MyChart Message (if you have MyChart) OR A paper copy in the mail If you have any lab test that is abnormal or we need to change your treatment, we will call you to review the results.   Testing/Procedures: Will be schedule at [facility] - radiology dept. Your physician has requested that you have coronary  CTA. Coronary computed tomography (CT)angiogram  is a special type of CT scan that uses a computer to produce multi-dimensional views of major blood vessels throughout the heart.  CT angiography, a contrast material is injected through an IV to help visualize the blood vessels  a painless test that uses an x-ray machine to take clear, detailed pictures of your heart arteries .  Please follow instruction sheet as given.   Follow-Up: At [facility], you and your health needs are our priority.  As part of our continuing mission to provide you with exceptional heart care, we have created designated Provider Care Teams.  These Care Teams include your primary Cardiologist (physician) and Advanced Practice Providers (APPs -  Physician Assistants and Nurse Practitioners) who all work together to provide you with the care you need, when you need it.     Your next appointment:    2 to 3 month(s)  The format for your next appointment:   In Person  Provider:   Bryan Lemma, MD    Other  Instructions     Your cardiac CT will be scheduled at the below location:     [facility] Please arrive at the [facility] and Children's Entrance (Entrance C2) of [facility] 30 minutes prior to test start time. You can use the FREE valet parking offered at entrance C (encouraged to control the heart rate for the test)  Proceed to the [facility] Radiology Department (first floor) to check-in and test prep.  All radiology patients and guests should use entrance C2 at [facility], accessed from [facility], even though the hospital's physical address listed is [address]      Please follow these instructions carefully (unless otherwise directed):  An IV will be required for this test and Nitroglycerin will be given.  CMP in  week   On the Night Before the Test: Be sure to Drink plenty of water. Do not consume any caffeinated/decaffeinated beverages or chocolate 12 hours prior to your test. Do not take any antihistamines 12 hours prior to your test.   On the Day of the Test: Drink plenty of water until 1 hour prior to the test. Do not eat any food 1 hour prior to test. You may take your regular medications prior to the test.  Take metoprolol  tartrate(Lopressor)  100 mg two hours prior to test. If you take Spironolactone, please HOLD on the morning of the test. FEMALES-  please wear underwire-free bra if available, avoid dresses & tight clothing   After the Test: Drink plenty of water. After receiving IV contrast, you may experience a mild flushed feeling. This is normal. On occasion, you may experience a mild rash up to 24 hours after the test. This is not dangerous. If this occurs, you can take Benadryl 25 mg and increase your fluid intake. If you experience trouble breathing, this can be serious. If it is severe call 911 IMMEDIATELY. If it is mild, please call our office.  We will call to schedule your test  2-4 weeks out understanding that some insurance companies will need an authorization prior to the service being performed.   For more information and frequently asked questions, please visit our website : [email or web address]  For non-scheduling related questions, please contact the cardiac imaging nurse navigator should you have any questions/concerns: Cardiac Imaging Nurse Navigators Direct Office Dial: [phone]   For scheduling needs, including cancellations and rescheduling, please call [place], [phone].

## 2023-06-10 NOTE — Assessment & Plan Note (Signed)
Dizziness or near syncope.Event monitor was not revealing. No significant arrhythmias noted on Zio patch monitor worn for 7 days in August 2024. -She has been referred  to neurology for further evaluation with MRI as planned.

## 2023-06-10 NOTE — Assessment & Plan Note (Addendum)
Unclear etiology as to why her EF is down to 40-45%. Will need to initiate workup for etiology:  Noted precordial pain which could be a sign of angina.  Since is not consistent, I think you are safe evaluated with Coronary CTA Check aldosterone renin activity and chemistry panel off of HCTZ to assess for hyperaldosteronism as a source of primary hypertension Check iron levels, TIBC and ferritin panel to look for iron storage disease. Consider MRI pending results of initial evaluation. Continue sleep study evaluation Continue portion for weight loss-is on Wegovy now. Initiate management for HFrEF as noted. => Striving for establishing 5 pillars of GDMT => converting Channel blocker to ARB with plans to potentially exchanged for Entresto, converting HCTZ to spironolactone, continue beta-blocker, SGLT2 inhibitor and GLP-1 agonist.  Reassess need for loop diuretic.Marland Kitchen

## 2023-06-10 NOTE — Assessment & Plan Note (Signed)
Somewhat resistant hypertension longstanding.  I do not think that a full workup for secondary causes has been done.  She did have renal artery Dopplers done that were normal in the past.  Plan: Currently on HCTZ use would be difficult to assess renin aldosterone system.  Will have her hold her HCTZ and in 1 week check chemistry panel and renin/aldosterone levels Convert BP meds to appropriate cardiomyopathy medications as noted.Marland Kitchen

## 2023-06-10 NOTE — Assessment & Plan Note (Signed)
With newly reduced EF and her having episodic chest tightness and squeezing, there is definitely concern for this being ischemic in nature as no other etiology is clearly obvious.  It is possible that could be due to volume overload as well and therefore I think additional afterload reduction is also recommended.  Switching from amlodipine to ARB with plans to titrate onto [facility].  Need to do ischemic evaluation since her symptoms are not consistently present I think were safe precluding a right left heart catheterization then move forward coronary CT angiogram. => Heart rate is somewhat elevated and we will provide blood pressure 100 mg patient with a condition to her Toprol 100 mg and she will probably also need Corlanor for rate control.  If we are not able to perform Coronary CTA, we could consider Stress PET

## 2023-06-10 NOTE — Assessment & Plan Note (Signed)
HFrEF ; Chronic Combined CHF Echocardiogram on 04/08/2023 showed EF of 40-45% with global hypokinesis, grade 1 diastolic dysfunction, and mildly reduced RV function. Normal aortic and mitral valves. Normal right atrial pressures.  Currently NYHA Class IIB Symptoms with Exertional Dyspnea and Chest Tightness. Will work to get on the 5 pillars of CHF management.  DC hydrochlorothiazide -> this allows to assess for hyperaldosteronism plus chemistry labs in 1 week. Convert HCTZ to spironolactone 25 mg daily-start after labs drawn Will plan to switch from Norvasc to valsartan after labs drawn to evaluate for hypertelorism. Recheck labs 2 to 3 weeks after initiating spironolactone and valsartan Would eventually want to consider Sherryll Burger wants to see what her blood pressure does. Continue current dose of Toprol along with [place]. Reassess volume status to see if she would need a loop diuretic.

## 2023-06-10 NOTE — Assessment & Plan Note (Signed)
Weight loss is mandatory.  Currently undergoing evaluation for sleep apnea.  Recently started on Wegovy.  Will need to be aggressive with treating her CHF symptoms to allow her to get out and walk more and lose weight.  This will help her heart work more efficiently.

## 2023-06-10 NOTE — Progress Notes (Signed)
Cardiology Office Note:  .   Date:  06/10/2023  ID:  TYEISHA DINAN, DOB 1971-09-08, MRN [id number] PCP: [name], DO  [facility] Providers Cardiologist:  Bryan Lemma, MD     Chief Complaint  Patient presents with   New Patient (Initial Visit)    Referral for reduced EF on echo and heart failure symptoms.   Cardiomyopathy    New diagnosis with EF 40 to 45%    Patient Profile: .     LAVAUGHN BISIG is a morbidly obese 52 y.o. African female  with a PMH notable for longstanding HTN dating back to age 41 with recent diagnosis of CARDIOMYOPATHY-HFrEF (EF 40 to 45%) with loss who presents here for initial cardiology consultation at the request of McDiarmid, Leighton Roach, MD.  Neysa Bonito ITZELLE GAINS was last seen on 05/08/2023 by Dr. Randye Lobo from the [facility].  She is still noticing dizzy spells with change positions.  Noted that error.  Polysomnogram was ordered for September 9 but this got rescheduled.  Had lost 5 pounds having started [facility].     Subjective   INTERVAL HPI Discussed the use of AI scribe software for clinical note transcription with the patient, who gave verbal consent to proceed.  History of Present Illness   Miss Mabin presents for evaluation of abnormal EKG, syncope, and low EF on echocardiogram. The echocardiogram, performed on April 08, 2023, revealed an EF of 40-45% with global hypokinesis, grade one diastolic dysfunction, and mildly reduced RV function.  Careli presents here today with one of her 2 daughters.  She seems to be pretty good spirits but has a lot of questions about her cardiac studies.  We reviewed the results of the echocardiogram and her monitor together as noted.  I did discuss with them the physiology of cardiomyopathy and my concerns about that.   She tells me that she definitely has been having exertional dyspnea that seems to be ongoing for the last several months.  She does have orthopnea but no real  PND.  Although this may be potentially related to sleep apnea.  She is still working on getting her sleep study established.  She does have some mild swelling but it seems to be doing okay since starting the [place], and she thinks the HCTZ is also helped..  She also describes having a tightness and squeezing sensation in her chest that happens off and on sometimes with the rest but often times with exertion.  It does not happen all the time, but it is deftly some that is new over the last couple weeks to months.  She has sometimes also feel her heart rate going up fast during his episodes but has not necessarily noticed prolonged tachycardia spells.  Since starting [facility] she has lost some weight and is hoping that that will continue to help her symptoms wise.  Cardiovascular ROS: positive for - chest pain, dyspnea on exertion, edema, orthopnea, paroxysmal nocturnal dyspnea, rapid heart rate, shortness of breath, and exercise intolerance, fatigue as well as some dizziness spells that is mostly positional.  Actually probably more like poor balance. negative for - irregular heartbeat, loss of consciousness, or near syncope, TIA or amaurosis fugax, claudication.  ROS:  Review of Systems - Negative except mild fatigue, exercise intolerance, rash/dermatitis.  Daytime sleepiness.     Objective   The patient has a history of morbid obesity and is awaiting a sleep study for suspected sleep apnea. She has also  experienced syncope, with an event monitor that was not revealing. She has been seen by neurology for an MRI.  The patient's current medications include Farxiga, ramipril, and HCTZ. She has been prescribed Wegovy but has not yet started this medication.      Studies Reviewed: .        Echocardiogram: EF 40-45%, global hypokinesis, grade 1 diastolic dysfunction, mildly reduced RV function, normal aortic and mitral valves, normal right atrial pressures (04/08/2023) Zio patch monitor: Sinus  rhythm 45-121 BPM, rare isolated PACs and PVCs, no couplets, no bigeminy or trigeminy, no prolonged or short arrhythmias (04/2023)   Risk Assessment/Calculations:              Physical Exam:   VS:  BP 118/74 (BP Location: Left Arm, Patient Position: Sitting, Cuff Size: Large)   Pulse 92   Ht 5\' 7"  (1.702 m)   Wt 284 lb (128.8 kg)   LMP 08/10/2022 (Approximate)   SpO2 94%   BMI 44.48 kg/m    Wt Readings from Last 3 Encounters:  06/10/23 284 lb (128.8 kg)  05/08/23 287 lb 9.6 oz (130.5 kg)  04/27/23 289 lb (131.1 kg)    GEN: Morbidly obese, well groomed.;  Well-nourished,, well developed in no acute distress;  NECK: Unable to visually inspect JVP, but appears to be mildly elevated at 8 cm water.; No carotid bruits CARDIAC: RRR with ectopy.  Distant heart sounds with normal S1 and S2, cannot exclude S4 gallop.  No obvious murmurs heard.   RESPIRATORY:  Clear to auscultation without rales, wheezing, or rhonchi ; nonlabored, good air movement. ABDOMEN: Soft, non-tender, non-distended; obese EXTREMITIES: Trace to 1+ bilateral edema; No deformity  She seems to be in pretty good spirits although maybe a little bit scared.     ASSESSMENT AND PLAN: .    Problem List Items Addressed This Visit       Cardiology Problems   Cardiomyopathy [facility]) (Chronic)    Unclear etiology as to why her EF is down to 40-45%. Will need to initiate workup for etiology:  Noted precordial pain which could be a sign of angina.  Since is not consistent, I think you are safe evaluated with Coronary CTA Check aldosterone renin activity and chemistry panel off of HCTZ to assess for hyperaldosteronism as a source of primary hypertension Check iron levels, TIBC and ferritin panel to look for iron storage disease. Consider MRI pending results of initial evaluation. Continue sleep study evaluation Continue portion for weight loss-is on Wegovy now. Initiate management for HFrEF as noted. => Striving for  establishing 5 pillars of GDMT => converting Channel blocker to ARB with plans to potentially exchanged for Entresto, converting HCTZ to spironolactone, continue beta-blocker, SGLT2 inhibitor and GLP-1 agonist.  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 Normal aortic and mitral valves. Normal right atrial pressures.  Currently NYHA Class IIB Symptoms with Exertional Dyspnea and Chest Tightness. Will work to get on the 5 pillars of CHF management.  DC hydrochlorothiazide -> this allows to assess for hyperaldosteronism plus chemistry labs in 1 week. Convert HCTZ to spironolactone 25 mg  daily-start after labs drawn Will plan to switch from Norvasc to valsartan after labs drawn to evaluate for hypertelorism. Recheck labs 2 to 3 weeks after initiating spironolactone and valsartan Would eventually want to consider Sherryll Burger wants to see what her blood pressure does. Continue current dose of Toprol along with [place]. Reassess volume status to see if she would need a loop diuretic.      Relevant Medications   metoprolol tartrate (LOPRESSOR) 100 MG tablet   spironolactone (ALDACTONE) 25 MG tablet   valsartan (DIOVAN) 40 MG tablet   Other Relevant Orders   Comprehensive metabolic panel   Comprehensive metabolic panel   Basic metabolic panel   Aldosterone + renin activity w/ ratio   Iron, TIBC and Ferritin Panel   Hyperlipidemia (Chronic)   Relevant Medications   metoprolol tartrate (LOPRESSOR) 100 MG tablet   spironolactone (ALDACTONE) 25 MG tablet   valsartan (DIOVAN) 40 MG tablet    Other Relevant Orders   Comprehensive metabolic panel   Comprehensive metabolic panel   Basic metabolic panel   Aldosterone + renin activity w/ ratio   Iron, TIBC and Ferritin Panel   Hypertension (Chronic)    Somewhat resistant hypertension longstanding.  I do not think that a full workup for secondary causes has been done.  She did have renal artery Dopplers done that were normal in the past.  Plan: Currently on HCTZ use would be difficult to assess renin aldosterone system.  Will have her hold her HCTZ and in 1 week check chemistry panel and renin/aldosterone levels Convert BP meds to appropriate cardiomyopathy medications as noted..      Relevant Medications   metoprolol tartrate (LOPRESSOR) 100 MG tablet   spironolactone (ALDACTONE) 25 MG tablet   valsartan (DIOVAN) 40 MG tablet   Other Relevant Orders   Comprehensive metabolic panel   Comprehensive metabolic panel   Basic metabolic panel   Aldosterone + renin activity w/ ratio   Iron, TIBC and Ferritin Panel     Other   Dizziness (Chronic)    Dizziness or near syncope.Event monitor was not revealing. No significant arrhythmias noted on Zio patch monitor worn for 7 days in August 2024. -She has been referred  to neurology for further evaluation with MRI as planned.      Morbid obesity with BMI of 45.0-49.9, adult (HCC) (Chronic)    Weight loss is mandatory.  Currently undergoing evaluation for sleep apnea.  Recently started on Wegovy.  Will need to be aggressive with treating her CHF symptoms to allow her to get out and walk more and lose weight.  This will help her heart work more efficiently.      Relevant Orders   Comprehensive metabolic panel   Comprehensive metabolic panel   Basic metabolic panel   Aldosterone + renin activity w/ ratio   Iron, TIBC and Ferritin Panel   Precordial pain    With newly reduced EF and her having episodic chest tightness and squeezing, there is definitely concern for this being ischemic  in nature as no other etiology is clearly obvious.  It is possible that could be due to volume overload as well and therefore I think additional afterload reduction is also recommended.  Switching from amlodipine to ARB with plans to titrate onto [facility].  Need to do ischemic evaluation since her symptoms are not consistently present I think were safe precluding a right left heart catheterization then move forward coronary CT angiogram. => Heart rate is somewhat elevated and we  will provide blood pressure 100 mg patient with a condition to her Toprol 100 mg and she will probably also need Corlanor for rate control.  If we are not able to perform Coronary CTA, we could consider Stress PET      Relevant Orders   Comprehensive metabolic panel   Comprehensive metabolic panel   Basic metabolic panel   CT CORONARY MORPH W/CTA COR W/SCORE W/CA W/CM &/OR WO/CM   Aldosterone + renin activity w/ ratio   Iron, TIBC and Ferritin Panel   Sleep apnea   Relevant Orders   Comprehensive metabolic panel   Comprehensive metabolic panel   Basic metabolic panel   Aldosterone + renin activity w/ ratio   Iron, TIBC and Ferritin Panel   Syncope    She truthfully did not have real syncope.  Just some mild vertigo symptoms.             Dispo: Return in about 2 months (around 08/10/2023) for Routine Follow-up after testing ~ 1-2 months.  Total time spent: 30 min spent with patient + spent charting = 65 min      Signed, Marykay Lex, MD, MS Bryan Lemma, M.D., M.S. Interventional Cardiologist  [facility]  Pager # [phone] Phone # [phone] [address] [place], [place] [address]

## 2023-06-10 NOTE — Assessment & Plan Note (Signed)
She truthfully did not have real syncope.  Just some mild vertigo symptoms.

## 2023-06-16 ENCOUNTER — Encounter (HOSPITAL_COMMUNITY): Payer: Self-pay

## 2023-06-18 ENCOUNTER — Telehealth (HOSPITAL_COMMUNITY): Payer: Self-pay | Admitting: *Deleted

## 2023-06-18 MED ORDER — SCOPOLAMINE 1 MG/3DAYS TD PT72: 1.0000 | MEDICATED_PATCH | TRANSDERMAL | 5 refills | Status: AC

## 2023-06-18 NOTE — Telephone Encounter (Signed)
Patient calling about the insurance regarding her cardiac CT scan. She states that she has met her deductible through [facility] and her study should be covered. Per information from the pre-auth team, [employer] states her Berkley Harvey is pending.  She states that she really needs to keep her appointment. I informed her that the only way she can keep her appointment is that she is willing to cover the cost of the study out of pocket, which would be a couple thousands of dollars. She verbalized understanding and wishes to proceed.   Larey Brick RN Navigator Cardiac Imaging [facility] [phone] Office [phone] Cell

## 2023-06-18 NOTE — Addendum Note (Signed)
Addended by: [name] on: 06/18/2023 09:03 AM   Modules accepted: Orders

## 2023-06-19 ENCOUNTER — Ambulatory Visit (HOSPITAL_COMMUNITY)
Admission: RE | Admit: 2023-06-19 | Discharge: 2023-06-19 | Disposition: A | Discharge status: Home / Self Care | Payer: Commercial Managed Care - HMO | Source: Ambulatory Visit | Attending: Cardiology | Admitting: Cardiology

## 2023-06-19 ENCOUNTER — Encounter: Payer: Self-pay | Admitting: Cardiology

## 2023-06-19 ENCOUNTER — Ambulatory Visit (HOSPITAL_COMMUNITY): Payer: Managed Care, Other (non HMO)

## 2023-06-19 DIAGNOSIS — R072 Precordial pain: Secondary | ICD-10-CM | POA: Diagnosis present

## 2023-06-19 MED ORDER — NITROGLYCERIN 0.4 MG SL SUBL
SUBLINGUAL_TABLET | SUBLINGUAL | Status: AC
  Filled 2023-06-19: qty 2

## 2023-06-19 MED ORDER — METOPROLOL TARTRATE 5 MG/5ML IV SOLN
INTRAVENOUS | Status: AC
  Filled 2023-06-19: qty 10

## 2023-06-19 MED ORDER — METOPROLOL TARTRATE 5 MG/5ML IV SOLN
5.0000 mg | Freq: Once | INTRAVENOUS | Status: AC
  Administered 2023-06-19: 5 mg via INTRAVENOUS

## 2023-06-19 MED ORDER — IOHEXOL 350 MG/ML SOLN
95.0000 mL | Freq: Once | INTRAVENOUS | Status: AC | PRN
  Administered 2023-06-19: 95 mL via INTRAVENOUS

## 2023-06-19 MED ORDER — NITROGLYCERIN 0.4 MG SL SUBL
0.8000 mg | SUBLINGUAL_TABLET | Freq: Once | SUBLINGUAL | Status: AC
  Administered 2023-06-19: 0.8 mg via SUBLINGUAL

## 2023-06-22 ENCOUNTER — Other Ambulatory Visit: Payer: Self-pay | Admitting: Family Medicine

## 2023-06-22 ENCOUNTER — Encounter: Payer: Self-pay | Admitting: Family Medicine

## 2023-06-22 LAB — IRON,TIBC AND FERRITIN PANEL
Ferritin: 172 ng/mL — ABNORMAL HIGH (ref 15–150)
Iron Saturation: 18 % (ref 15–55)
Iron: 56 ug/dL (ref 27–159)
Total Iron Binding Capacity: 317 ug/dL (ref 250–450)
UIBC: 261 ug/dL (ref 131–425)

## 2023-06-22 LAB — ALDOSTERONE + RENIN ACTIVITY W/ RATIO
Aldos/Renin Ratio: 50.4 — ABNORMAL HIGH (ref 0.0–30.0)
Renin Activity, Plasma: 0.25 ng/mL/h (ref 0.167–5.380)
Renin Activity, Plasma: 12.6 ng/mL/h (ref 0.167–5.380)

## 2023-06-22 LAB — COMPREHENSIVE METABOLIC PANEL
ALT: 8 [IU]/L (ref 0–32)
AST: 10 [IU]/L (ref 0–40)
Albumin: 4.3 g/dL (ref 3.9–4.9)
Alkaline Phosphatase: 76 [IU]/L (ref 44–121)
BUN/Creatinine Ratio: 9 (ref 9–23)
BUN: 10 mg/dL (ref 6–24)
Bilirubin Total: 0.3 mg/dL (ref 0.0–1.2)
CO2: 26 mmol/L (ref 20–29)
Calcium: 9.6 mg/dL (ref 8.7–10.2)
Chloride: 99 mmol/L (ref 96–106)
Creatinine, Ser: 1.13 mg/dL — ABNORMAL HIGH (ref 0.57–1.00)
Globulin, Total: 3.2 g/dL (ref 1.5–4.5)
Glucose: 84 mg/dL (ref 70–99)
Potassium: 4.1 mmol/L (ref 3.5–5.2)
Sodium: 138 mmol/L (ref 134–144)
Total Protein: 7.5 g/dL (ref 6.0–8.5)
eGFR: 59 mL/min/{1.73_m2} — ABNORMAL LOW (ref >59)

## 2023-06-22 MED ORDER — FLUCONAZOLE 150 MG PO TABS: 150.0000 mg | ORAL_TABLET | Freq: Once | ORAL | 0 refills | Status: AC

## 2023-06-24 ENCOUNTER — Encounter (HOSPITAL_BASED_OUTPATIENT_CLINIC_OR_DEPARTMENT_OTHER): Payer: Self-pay | Admitting: Emergency Medicine

## 2023-06-24 ENCOUNTER — Emergency Department (HOSPITAL_BASED_OUTPATIENT_CLINIC_OR_DEPARTMENT_OTHER): Payer: Commercial Managed Care - HMO

## 2023-06-24 ENCOUNTER — Emergency Department (HOSPITAL_BASED_OUTPATIENT_CLINIC_OR_DEPARTMENT_OTHER)
Admission: EM | Admit: 2023-06-24 | Discharge: 2023-06-24 | Disposition: A | Discharge status: Home / Self Care | Payer: Commercial Managed Care - HMO

## 2023-06-24 ENCOUNTER — Other Ambulatory Visit: Payer: Self-pay

## 2023-06-24 DIAGNOSIS — I110 Hypertensive heart disease with heart failure: Secondary | ICD-10-CM | POA: Diagnosis not present

## 2023-06-24 DIAGNOSIS — Z20822 Contact with and (suspected) exposure to covid-19: Secondary | ICD-10-CM | POA: Insufficient documentation

## 2023-06-24 DIAGNOSIS — I2510 Atherosclerotic heart disease of native coronary artery without angina pectoris: Secondary | ICD-10-CM | POA: Diagnosis not present

## 2023-06-24 DIAGNOSIS — Z79899 Other long term (current) drug therapy: Secondary | ICD-10-CM | POA: Diagnosis not present

## 2023-06-24 DIAGNOSIS — R110 Nausea: Secondary | ICD-10-CM | POA: Diagnosis not present

## 2023-06-24 DIAGNOSIS — Z91040 Latex allergy status: Secondary | ICD-10-CM | POA: Diagnosis not present

## 2023-06-24 DIAGNOSIS — R5383 Other fatigue: Secondary | ICD-10-CM | POA: Diagnosis present

## 2023-06-24 DIAGNOSIS — I509 Heart failure, unspecified: Secondary | ICD-10-CM | POA: Insufficient documentation

## 2023-06-24 DIAGNOSIS — R531 Weakness: Secondary | ICD-10-CM | POA: Insufficient documentation

## 2023-06-24 HISTORY — DX: Atherosclerotic heart disease of native coronary artery without angina pectoris: I25.10

## 2023-06-24 HISTORY — DX: Heart failure, unspecified: I50.9

## 2023-06-24 LAB — CBC
HCT: 39.3 % (ref 36.0–46.0)
Hemoglobin: 12.7 g/dL (ref 12.0–15.0)
MCH: 27.9 pg (ref 26.0–34.0)
MCHC: 32.3 g/dL (ref 30.0–36.0)
MCV: 86.4 fL (ref 80.0–100.0)
Platelets: 215 10*3/uL (ref 150–400)
RBC: 4.55 MIL/uL (ref 3.87–5.11)
RDW: 14.5 % (ref 11.5–15.5)
WBC: 6.5 10*3/uL (ref 4.0–10.5)
nRBC: 0 % (ref 0.0–0.2)

## 2023-06-24 LAB — TROPONIN I (HIGH SENSITIVITY)
Troponin I (High Sensitivity): 2 ng/L (ref <18)
Troponin I (High Sensitivity): 2 ng/L (ref <18)

## 2023-06-24 LAB — COMPREHENSIVE METABOLIC PANEL
ALT: 8 U/L (ref 0–44)
AST: 13 U/L — ABNORMAL LOW (ref 15–41)
Albumin: 4.2 g/dL (ref 3.5–5.0)
Alkaline Phosphatase: 57 U/L (ref 38–126)
Anion gap: 8 (ref 5–15)
BUN: 10 mg/dL (ref 6–20)
CO2: 29 mmol/L (ref 22–32)
Calcium: 9.5 mg/dL (ref 8.9–10.3)
Chloride: 100 mmol/L (ref 98–111)
Creatinine, Ser: 1.14 mg/dL — ABNORMAL HIGH (ref 0.44–1.00)
GFR, Estimated: 59 mL/min — ABNORMAL LOW (ref >60)
Glucose, Bld: 87 mg/dL (ref 70–99)
Potassium: 3.6 mmol/L (ref 3.5–5.1)
Sodium: 137 mmol/L (ref 135–145)
Total Bilirubin: 0.4 mg/dL (ref 0.3–1.2)
Total Protein: 7.7 g/dL (ref 6.5–8.1)

## 2023-06-24 LAB — BRAIN NATRIURETIC PEPTIDE: B Natriuretic Peptide: 16 pg/mL (ref 0.0–100.0)

## 2023-06-24 LAB — SARS CORONAVIRUS 2 BY RT PCR: SARS Coronavirus 2 by RT PCR: NEGATIVE

## 2023-06-24 MED ORDER — ONDANSETRON HCL 4 MG/2ML IJ SOLN
4.0000 mg | Freq: Once | INTRAMUSCULAR | Status: AC
  Administered 2023-06-24: 4 mg via INTRAVENOUS
  Filled 2023-06-24: qty 2

## 2023-06-24 MED ORDER — ONDANSETRON 4 MG PO TBDP: 4.0000 mg | ORAL_TABLET | Freq: Three times a day (TID) | ORAL | 0 refills | Status: AC | PRN

## 2023-06-24 MED ORDER — KETOROLAC TROMETHAMINE 30 MG/ML IJ SOLN
30.0000 mg | Freq: Once | INTRAMUSCULAR | Status: AC
  Administered 2023-06-24: 30 mg via INTRAVENOUS
  Filled 2023-06-24: qty 1

## 2023-06-24 NOTE — ED Provider Notes (Signed)
Summer Brewer EMERGENCY DEPARTMENT AT [facility] Provider Note   CSN: [id number] Arrival date & time: 06/24/23  1416     History  No chief complaint on file.   Summer Brewer is a 52 y.o. female with a history of congestive heart failure,, hypertension, coronary artery disease, and precordial pain presents the ED today for fatigue and nausea.  Patient reports she has been feeling nauseous and fatigued for the past several days with associated generalized weakness.  No focal neuro deficits.  Additionally, she endorses chest pain he with her left breast that started this morning.  Pain is worse with deep inspiration and movement.  Denies fever, shortness of breath, cough, vomiting, diarrhea, or abdominal pain.   Patient reports that her cardiologist started her on some new medications several weeks ago, including a diuretic, she is unsure if this could be playing or new symptoms.  She was prescribed Scopolamine patches for her nausea, which have not improved the symptoms.  No additional complaints or concerns at this time.    Home Medications Prior to Admission medications   Medication Sig Start Date End Date Taking? Authorizing Provider  ondansetron (ZOFRAN-ODT) 4 MG disintegrating tablet Take 1 tablet (4 mg total) by mouth every 8 (eight) hours as needed for nausea or vomiting. 06/24/23 07/24/23 Yes Maxwell Marion, PA-C  albuterol (VENTOLIN HFA) 108 (90 Base) MCG/ACT inhaler Inhale 2 puffs into the lungs every 4 (four) hours as needed. Patient not taking: Reported on 06/10/2023 02/22/23   [provider]  cetirizine (ZYRTEC) 10 MG tablet TAKE 1 TABLET BY MOUTH EVERY DAY 09/25/22   Elberta Fortis, MD  dapagliflozin propanediol (FARXIGA) 10 MG TABS tablet Take 1 tablet (10 mg total) by mouth daily. 04/16/23   [name], DO  famotidine (PEPCID) 40 MG tablet Take 1 tablet (40 mg total) by mouth 2 (two) times daily. 05/08/23 11/04/23  [name], DO  fluticasone  (FLONASE) 50 MCG/ACT nasal spray Place 1 spray into both nostrils as needed. Patient not taking: Reported on 06/10/2023 01/22/16   [provider]  Galcanezumab-gnlm (EMGALITY) 120 MG/ML SOAJ INJECT 240 MG INTO THE SKIN ONCE FOR 1 DOSE. LOADING DOSE 10/27/22   Drema Dallas, DO  halobetasol (ULTRAVATE) 0.05 % cream APPLY TOPICALLY TWICE A DAY 03/13/23   [name], DO  hydrOXYzine (ATARAX) 10 MG tablet TAKE 1 TABLET (10 MG TOTAL) BY MOUTH AT BEDTIME AS NEEDED FOR ITCHING Patient not taking: Reported on 06/10/2023 08/04/22   Elberta Fortis, MD  metoprolol succinate (TOPROL-XL) 100 MG 24 hr tablet Take 1 tablet (100 mg total) by mouth at bedtime. Take with or immediately following a meal. 04/10/23   Erick Alley, DO  metoprolol tartrate (LOPRESSOR) 100 MG tablet Take 1 tablet (100 mg total) by mouth once for 1 dose. TAKE TWO HOURS PRIOR TO  SCHEDULE CARDIAC TEST 06/10/23 06/10/23  Marykay Lex, MD  ondansetron (ZOFRAN) 4 MG tablet Take 1 tablet (4 mg total) by mouth every 8 (eight) hours as needed for nausea or vomiting. 05/08/23   [name], DO  scopolamine (TRANSDERM-SCOP) 1 MG/3DAYS Place 1 patch (1.5 mg total) onto the skin every 3 (three) days. 06/18/23   [name], DO  Semaglutide-Weight Management 0.5 MG/0.5ML SOAJ Inject 0.5 mg into the skin once a week. 06/02/23   [name], DO  spironolactone (ALDACTONE) 25 MG tablet Take 1 tablet (25 mg total) by mouth daily. 06/10/23 09/08/23  Marykay Lex, MD  triamcinolone cream (KENALOG) 0.5 % Apply 1 Application topically 2 (two) times daily as needed. 03/29/23   [provider]  triamcinolone ointment (KENALOG) 0.5 % Apply 1 Application topically as needed. 01/04/16   [provider]  UBRELVY 100 MG TABS TAKE 1 TABLET BY MOUTH AS NEEDED. MAY REPEAT IN 2 HOURS. MAXIMUM 2 TABLETS IN 24 HOURS 07/22/22   Everlena Cooper, Adam R, DO  valsartan (DIOVAN) 40 MG tablet Take 1 tablet (40 mg total) by mouth daily. 06/10/23    Marykay Lex, MD      Allergies    Latex and Tizanidine    Review of Systems   Review of Systems  Constitutional:  Positive for fatigue.  All other systems reviewed and are negative.   Physical Exam Updated Vital Signs BP (!) 138/93   Pulse 85   Temp 98.7 F (37.1 C)   Resp 15   Wt 128.8 kg   LMP 08/10/2022 (Approximate)   SpO2 99%   BMI 44.48 kg/m  Physical Exam Vitals and nursing note reviewed.  Constitutional:      General: She is not in acute distress.    Appearance: Normal appearance.  HENT:     Head: Normocephalic and atraumatic.     Mouth/Throat:     Mouth: Mucous membranes are moist.     Pharynx: Oropharynx is clear.  Eyes:     Conjunctiva/sclera: Conjunctivae normal.     Pupils: Pupils are equal, round, and reactive to light.  Cardiovascular:     Rate and Rhythm: Normal rate and regular rhythm.     Pulses: Normal pulses.     Heart sounds: Normal heart sounds.  Pulmonary:     Effort: Pulmonary effort is normal.     Breath sounds: Normal breath sounds.  Abdominal:     Palpations: Abdomen is soft.     Tenderness: There is no abdominal tenderness.  Musculoskeletal:        General: Normal range of motion.     Cervical back: Normal range of motion. No tenderness.     Right lower leg: No edema.     Left lower leg: No edema.     Comments: Strength and sensation intact bilaterally of upper and lower extremities.  Lymphadenopathy:     Cervical: No cervical adenopathy.  Skin:    General: Skin is warm and dry.     Findings: No rash.  Neurological:     General: No focal deficit present.     Mental Status: She is alert.     Sensory: No sensory deficit.     Motor: No weakness.  Psychiatric:        Mood and Affect: Mood normal.        Behavior: Behavior normal.    ED Results / Procedures / Treatments   Labs (all labs ordered are listed, but only abnormal results are displayed) Labs Reviewed  COMPREHENSIVE METABOLIC PANEL - Abnormal; Notable for  the following components:      Result Value   Creatinine, Ser 1.14 (*)    AST 13 (*)    GFR, Estimated 59 (*)    All other components within normal limits  SARS CORONAVIRUS 2 BY RT PCR  CBC  BRAIN NATRIURETIC PEPTIDE  TROPONIN I (HIGH SENSITIVITY)  TROPONIN I (HIGH SENSITIVITY)    EKG None  Radiology DG Chest Portable 1 View  Result Date: 06/24/2023 CLINICAL DATA:  52 year old female with chest pain, nausea, fatigue. EXAM: PORTABLE CHEST 1 VIEW COMPARISON:  Cardiac  CT 06/19/2023. FINDINGS: Portable AP upright view at 1550 hours. Stable lung volumes. Mediastinal contours are within normal limits. Allowing for portable technique the lungs are clear. No pneumothorax or pleural effusion. No acute osseous abnormality identified. Paucity of bowel gas the visible abdomen. IMPRESSION: Negative portable chest. Electronically Signed   By: Odessa Fleming M.D.   On: 06/24/2023 18:29    Procedures Procedures: not indicated.   Medications Ordered in ED Medications  ondansetron (ZOFRAN) injection 4 mg (4 mg Intravenous Given 06/24/23 1523)  ketorolac (TORADOL) 30 MG/ML injection 30 mg (30 mg Intravenous Given 06/24/23 1525)    ED Course/ Medical Decision Making/ A&P                                 Medical Decision Making Amount and/or Complexity of Data Reviewed Labs: ordered. Radiology: ordered.  Risk Prescription drug management.   This patient presents to the ED for concern of fatigue, nausea, and chest pain, this involves an extensive number of treatment options, and is a complaint that carries with it a high risk of complications and morbidity.   Differential diagnosis includes: COVID, URI, electrolyte abnormality, dehydration, gastroenteritis, medication side effect, ACS, pneumonia, pneumothorax, costochondritis, pleurisy, muscle strain, etc.   Comorbidities  See HPI above   Additional History  Additional history obtained from previous PCP and cardiology notes.   Cardiac  Monitoring / EKG  The patient was maintained on a cardiac monitor.  I personally viewed and interpreted the cardiac monitored which showed: sinus rhythm with a heart rate of 81 bpm.   Lab Tests  I ordered and personally interpreted labs.  The pertinent results include:   Initial and repeat troponin of 2 For COVID Creatinine of 1.14 and GFR of 59 on CMP, which is normal for patient BNP is unremarkable CBC is unremarkable   Imaging Studies  I ordered imaging studies including CXR  I independently visualized and interpreted imaging which showed: no acute cardiopulmonary disease. I agree with the radiologist interpretation   Problem List / ED Course / Critical Interventions / Medication Management  Nausea and fatigue x3 days, chest pain since this morning I ordered medications including: Zofran for nausea  Toradol for chest pain Reevaluation of the patient after these medicines showed that the patient improved. I have reviewed the patients home medicines and have made adjustments as needed.   Social Determinants of Health  Access to healthcare   Test / Admission - Considered  Discussed findings with patient. She is hemodynamically stable and safe for discharge home. Prescription for Zofran sent to the pharmacy.   Return precautions provided.       Final Clinical Impression(s) / ED Diagnoses Final diagnoses:  Fatigue, unspecified type  Nausea    Rx / DC Orders ED Discharge Orders          Ordered    ondansetron (ZOFRAN-ODT) 4 MG disintegrating tablet  Every 8 hours PRN        06/24/23 1842              Maxwell Marion, PA-C 06/24/23 1847    Royanne Foots, DO 06/29/23 [phone]

## 2023-06-24 NOTE — ED Notes (Signed)
Given ginger ail and crackers

## 2023-06-24 NOTE — Discharge Instructions (Addendum)
As discussed, your labs and imaging are unremarkable.  Take Zofran up to 3 times a day as needed for nausea.  Follow-up with your PCP in 1 week for reevaluation of your symptoms.  Return to the ED if your symptoms worsen in the interim.

## 2023-06-24 NOTE — ED Triage Notes (Signed)
Nauseated/fatigue/ . Has recent dx chf and cad.

## 2023-06-29 ENCOUNTER — Encounter: Payer: Self-pay | Admitting: Cardiology

## 2023-06-29 ENCOUNTER — Ambulatory Visit: Payer: Commercial Managed Care - HMO | Attending: Cardiology | Admitting: Cardiology

## 2023-06-29 VITALS — BP 122/78 | HR 100 | Ht 67.0 in | Wt 284.0 lb

## 2023-06-29 DIAGNOSIS — I10 Essential (primary) hypertension: Secondary | ICD-10-CM

## 2023-06-29 DIAGNOSIS — G4733 Obstructive sleep apnea (adult) (pediatric): Secondary | ICD-10-CM

## 2023-06-29 DIAGNOSIS — E782 Mixed hyperlipidemia: Secondary | ICD-10-CM

## 2023-06-29 DIAGNOSIS — I429 Cardiomyopathy, unspecified: Secondary | ICD-10-CM

## 2023-06-29 DIAGNOSIS — Z6841 Body Mass Index (BMI) 40.0 and over, adult: Secondary | ICD-10-CM

## 2023-06-29 DIAGNOSIS — I5042 Chronic combined systolic (congestive) and diastolic (congestive) heart failure: Secondary | ICD-10-CM

## 2023-06-29 DIAGNOSIS — R42 Dizziness and giddiness: Secondary | ICD-10-CM

## 2023-06-29 DIAGNOSIS — R072 Precordial pain: Secondary | ICD-10-CM

## 2023-06-29 NOTE — Assessment & Plan Note (Signed)
What she describing seems to be intermittent and not associated really with exertion.  Relatively reassuring Coronary CTA.  Could be related to volume overload but seems to be pretty euvolemic today.

## 2023-06-29 NOTE — Patient Instructions (Signed)
Medication Instructions:   No changes except hold your Spironolactone  if you are nausea from adjusting your [phone]  *If you need a refill on your cardiac medications before your next appointment, please call your pharmacy*   Lab Work: Not needed    Testing/Procedures:  Not needed  Follow-Up: At [facility], you and your health needs are our priority.  As part of our continuing mission to provide you with exceptional heart care, we have created designated Provider Care Teams.  These Care Teams include your primary Cardiologist (physician) and Advanced Practice Providers (APPs -  Physician Assistants and Nurse Practitioners) who all work together to provide you with the care you need, when you need it.     Your next appointment:    3 to 4 month(s)  The format for your next appointment:   In Person  Provider:   Bryan Lemma, MD    Other Instructions    Stay Hydrated. - 70 ounces of water (or water based drinks -- not sodas, tea or coffee) per day.

## 2023-06-29 NOTE — Progress Notes (Signed)
Cardiology Office Note:  .   Date:  06/29/2023  ID:  Summer Brewer, DOB November 29, 1970, MRN [id number] PCP: [name], DO  [facility] Providers Cardiologist:  Bryan Lemma, MD     Chief Complaint  Patient presents with   Hospitalization Follow-up    Recent ER visit, follow-up sooner than ahead and schedule.   Follow-up   Congestive Heart Failure    Seems to be tolerating her current meds now.  And had some dizziness and fatigue but may be related to changing Wegovy dose.    Patient Profile: .     Summer Brewer is a morbidly obese 52 y.o.African-American female with a PMH notable for Nonischemic CM (EF 40 to 45%) who presents here for 3-week follow-up to discuss ER visit at the request of [name], DO.     [name] was seen on 06/10/2023 for initial consultation for reduced EF.  This was at the referral of Dr. Linwood Dibbles from [facility].  States still having some positional dizziness.  Noted 5 pound weight loss since starting Wegovy. Started on spironolactone 25 mg daily and continued on losartan.  Subjective  ER visit 06/24/2023 for fatigue and nausea associated with generalized weakness.  Noted pain in the left breast-worse with deep inspiration.  No fevers or chills. => Treated with Zofran for nausea and Toradol for musculoskeletal chest pain with improvement of symptoms.  Discussed the use of AI scribe software for clinical note transcription with the patient, who gave verbal consent to proceed.  History of Present Illness   The patient, with a history of migraines, heart disease, and obesity, presented for a follow-up visit after a recent hospitalization. She reported feeling significantly better since her hospital discharge, with resolution of her previous symptoms of nausea and chest discomfort. The chest discomfort was described as a heavy sensation, akin to an 'elephant sitting on my chest,' located centrally and radiating to the  left. This discomfort was not associated with deep breathing and resolved spontaneously after a short duration. The patient initially attributed this discomfort to indigestion or GERD, which she has a history of.  The patient also reported a significant improvement in her previous symptoms of dizziness and lightheadedness since starting on new medications, including metoprolol, valsartan, and spironolactone. She noted that these symptoms began to resolve approximately a week after starting these medications.  However, the patient reported ongoing issues with shortness of breath, particularly when lying flat, which she manages by sleeping propped up on multiple pillows. She also reported occasional episodes of waking up in the middle of the night feeling as though she can't breathe. The patient also reported some mild swelling around her ankles.  The patient also reported experiencing shortness of breath with exertion, particularly when using her arms for extended periods or moving around a lot. She has been trying to manage this by taking breaks during activity and has been making an effort to exercise daily.  The patient has been on [facility] for weight loss for the past seven weeks and reported some initial nausea with this medication. She has been advised to ensure adequate hydration while on this medication. The patient is also awaiting approval for a CPAP machine for recently diagnosed sleep apnea.  The patient is also on [place] for migraine management. She has not reported any recent migraines. She has not had any recent episodes of syncope or near-syncope since her last visit.  The patient's blood pressure  was well-controlled at this visit on her current regimen of metoprolol, valsartan, and spironolactone. She was advised to continue these medications and to return for a follow-up visit in three months.      Cardiovascular ROS: positive for - dyspnea on exertion, edema, orthopnea,  and improving dizziness and lightheadedness.-She thinks that the recent episode had something to do with adjusting her Wegovy dose; 1 short-lived episode of chest pain left upper chest-central. negative for - palpitations, paroxysmal nocturnal dyspnea, rapid heart rate, or syncope or near syncope, TIA or amaurosis fugax, claudication  ROS:  Review of Systems - Negative except if not noted above.    Objective   Studies Reviewed: Marland Kitchen       Coronary CTA 06/19/2023: Coronary Calcium Score 0.  Moderate total plaque volume.  Mild nonobstructive disease with soft plaque in the LAD.  Echocardiogram 06/10/2023: EF 40-45%, global hypokinesis, grade 1 diastolic dysfunction, mildly reduced RV function, normal aortic and mitral valves, normal right atrial pressures (04/08/2023) Zio patch monitor 04/27/23: Sinus rhythm 45-121 BPM, rare isolated PACs and PVCs, no couplets, no bigeminy or trigeminy, no prolonged or short arrhythmias (04/2023)  Lab Results  Component Value Date   NA 137 06/24/2023   K 3.6 06/24/2023   CREATININE 1.14 (H) 06/24/2023   GFRNONAA 59 (L) 06/24/2023   GLUCOSE 87 06/24/2023   Lab Results  Component Value Date   HGBA1C 5.9 (A) 04/27/2023   Lab Results  Component Value Date   CHOL 192 01/14/2022   HDL 43 01/14/2022   LDLCALC 113 (H) 01/14/2022   TRIG 208 (H) 01/14/2022   CHOLHDL 4.5 (H) 01/14/2022     Risk Assessment/Calculations:              Physical Exam:   VS:  BP 122/78   Pulse 100   Ht 5\' 7"  (1.702 m)   Wt 284 lb (128.8 kg)   LMP 08/10/2022 (Approximate)   SpO2 96%   BMI 44.48 kg/m    Wt Readings from Last 3 Encounters:  06/29/23 284 lb (128.8 kg)  06/24/23 284 lb (128.8 kg)  06/10/23 284 lb (128.8 kg)    GEN: Morbidly Obese, otherwise healthy-appearing, well groomed in no acute distress;  NECK: No JVD; No carotid bruits CARDIAC: Distant heart sounds-Normal S1, S2; RRR, no murmurs, rubs,; S4 gallop noted. RESPIRATORY:  Clear to auscultation  without rales, wheezing or rhonchi ; nonlabored, good air movement. ABDOMEN: Soft, non-tender, non-distended EXTREMITIES: Trivial bilateral ankle edema; No deformity      ASSESSMENT AND PLAN: .    Problem List Items Addressed This Visit       Cardiology Problems   Cardiomyopathy [facility]) - Primary (Chronic)    Nonischemic Coronary CTA, suspect this is related to combination of hypertension and obesity. Titrating to stable regimen.      Chronic combined systolic and diastolic heart failure (HCC); mildly reduced EF (Chronic)    Global hypokinesis on echocardiogram, likely secondary to hypertension and obesity.  No evidence of coronary artery disease on coronary calcium score.  Shortness of breath with exertion and inability to lay flat, possibly related to obesity and sleep apnea.=> NYHA Class IIA Plan: -Continue current medications:Valsartan 40 mg daily, Spironolactone 25 mg daily, Toprol 100 mg daily, and Farxiga 10 mg daily. -Consider transition to Entresto in 3 months, pending response to [facility]. -Encourage daily exercise and weight loss.      Hyperlipidemia (Chronic)    Due for labs to be checked.  Not currently on statin. She  is also on the [place].      Hypertension (Chronic)     Other   Dizziness (Chronic)    The dizziness and nausea seems to have predated making medication changes.  A lot of the episode that she went to the ER for potentially related to increasing the [facility] dose. -Encourage hydration with at least 70 ounces of water daily. -If nausea persists and affects hydration, hold Spironolactone for a few days.      Morbid obesity with BMI of 45.0-49.9, adult (HCC) (Chronic)    Weight loss is crucial to help with her reduced EF and ongoing health. . She is now on [facility] for weight loss, reports nausea => likely related to dehydration with poor p.o. intake and poor hydration. => Encourage increased to roughly 80 ounces of fluid hydration per day       Obstructive sleep apnea (Chronic)    Patient reports difficulty breathing when lying flat, awaiting approval for CPAP machine. -Continue to pursue CPAP therapy.      Precordial pain    What she describing seems to be intermittent and not associated really with exertion.  Relatively reassuring Coronary CTA.  Could be related to volume overload but seems to be pretty euvolemic today.              Return in about 4 months (around 10/30/2023) for 3-4 month follow-up.Follow-up in late January or early February 2025 to reassess response to [facility] and potential transition to [place].   Total time spent: 34 min spent with patient + 19 min spent charting = 53 min      Signed, Marykay Lex, MD, MS Bryan Lemma, M.D., M.S. Interventional Cardiologist  [facility]  Pager # [phone] Phone # [phone] [address] [place], [place] [address]

## 2023-06-29 NOTE — Assessment & Plan Note (Signed)
Nonischemic Coronary CTA, suspect this is related to combination of hypertension and obesity. Titrating to stable regimen.

## 2023-06-29 NOTE — Assessment & Plan Note (Signed)
Patient reports difficulty breathing when lying flat, awaiting approval for CPAP machine. -Continue to pursue CPAP therapy.

## 2023-06-29 NOTE — Assessment & Plan Note (Signed)
The dizziness and nausea seems to have predated making medication changes.  A lot of the episode that she went to the ER for potentially related to increasing the [facility] dose. -Encourage hydration with at least 70 ounces of water daily. -If nausea persists and affects hydration, hold Spironolactone for a few days.

## 2023-06-29 NOTE — Assessment & Plan Note (Addendum)
Weight loss is crucial to help with her reduced EF and ongoing health. . She is now on [facility] for weight loss, reports nausea => likely related to dehydration with poor p.o. intake and poor hydration. => Encourage increased to roughly 80 ounces of fluid hydration per day

## 2023-06-29 NOTE — Assessment & Plan Note (Addendum)
Global hypokinesis on echocardiogram, likely secondary to hypertension and obesity.  No evidence of coronary artery disease on coronary calcium score.  Shortness of breath with exertion and inability to lay flat, possibly related to obesity and sleep apnea.=> NYHA Class IIA Plan: -Continue current medications:Valsartan 40 mg daily, Spironolactone 25 mg daily, Toprol 100 mg daily, and Farxiga 10 mg daily. -Consider transition to Entresto in 3 months, pending response to [facility]. -Encourage daily exercise and weight loss.

## 2023-06-29 NOTE — Assessment & Plan Note (Signed)
Due for labs to be checked.  Not currently on statin. She is also on the [place].

## 2023-07-10 ENCOUNTER — Other Ambulatory Visit: Payer: Self-pay

## 2023-07-10 ENCOUNTER — Ambulatory Visit (INDEPENDENT_AMBULATORY_CARE_PROVIDER_SITE_OTHER): Payer: Managed Care, Other (non HMO) | Admitting: Family Medicine

## 2023-07-10 ENCOUNTER — Encounter: Payer: Self-pay | Admitting: Family Medicine

## 2023-07-10 ENCOUNTER — Other Ambulatory Visit (HOSPITAL_BASED_OUTPATIENT_CLINIC_OR_DEPARTMENT_OTHER): Payer: Self-pay

## 2023-07-10 ENCOUNTER — Encounter (HOSPITAL_BASED_OUTPATIENT_CLINIC_OR_DEPARTMENT_OTHER): Payer: Self-pay

## 2023-07-10 VITALS — BP 118/75 | HR 91 | Temp 98.5°F | Ht 67.0 in | Wt 281.8 lb

## 2023-07-10 DIAGNOSIS — Z6841 Body Mass Index (BMI) 40.0 and over, adult: Secondary | ICD-10-CM

## 2023-07-10 DIAGNOSIS — R7989 Other specified abnormal findings of blood chemistry: Secondary | ICD-10-CM

## 2023-07-10 DIAGNOSIS — Z23 Encounter for immunization: Secondary | ICD-10-CM | POA: Diagnosis not present

## 2023-07-10 DIAGNOSIS — I10 Essential (primary) hypertension: Secondary | ICD-10-CM

## 2023-07-10 DIAGNOSIS — K219 Gastro-esophageal reflux disease without esophagitis: Secondary | ICD-10-CM

## 2023-07-10 DIAGNOSIS — G4733 Obstructive sleep apnea (adult) (pediatric): Secondary | ICD-10-CM

## 2023-07-10 DIAGNOSIS — Z90710 Acquired absence of both cervix and uterus: Secondary | ICD-10-CM

## 2023-07-10 MED ORDER — SEMAGLUTIDE-WEIGHT MANAGEMENT 1 MG/0.5ML ~~LOC~~ SOAJ
1.0000 mg | SUBCUTANEOUS | 0 refills | Status: DC
  Filled 2023-07-10: qty 2, 28d supply, fill #0

## 2023-07-10 NOTE — Progress Notes (Signed)
   SUBJECTIVE:   CHIEF COMPLAINT / HPI:   HFmrEF, HTN, OSA: - Medications: farxiga, metoprolol, valsartan, spironolactone, wegovy.  - follows with Cardiology, no CAD on coronary calcium score. Considering addition of entresto pending wegovy response. F/u due 10/2023. - Compliance: good - Checking BP at home: yes, 120/70s, highest 140 once. - OSA - hasn't had CPAP study yet. Needs to reschedule   OBESITY - Meds: wegovy - Previously on none. - Complications of obesity: HFrEF, HTN, HLD, OSA, prediabetes - Peak weight: 292lb - Weight loss to date: 9lb - has had good effort with diet and exercise as able - does not have contraindication to GLP-1 agonist, no h/o medullary thyroid cancer or MEN2. - not pregnant or lactating. - no concurrent use of GLP-1 agonist.  Nausea, dizziness - seen in ED 10/16. Reassuring labs, imaging, EKG. Thought maybe 2/2 dehydration in setting of wegovy dose increase. Also with significant personal stress around that time with death in the family and thinks possibly contributed. No episodes since.  GERD - on pepcid BID. Worse with certain foods, red meats, chocolate, citrus. Nonsmoker. Taking iberogast with relief.   OBJECTIVE:   BP 118/75   Pulse 91   Temp 98.5 F (36.9 C)   Ht 5\' 7"  (1.702 m)   Wt 281 lb 12.8 oz (127.8 kg)   LMP 08/10/2022 (Approximate)   SpO2 99%   BMI 44.14 kg/m   Gen: well appearing, in NAD Card: Reg rate Lungs: Comfortable WOB on RA Ext: WWP   ASSESSMENT/PLAN:   Hypertension Doing well on current regimen, no changes made today. Continue to follow with Cardiology.  Obstructive sleep apnea Await CPAP study. Instructed to call and reschedule.  Morbid obesity with BMI of 45.0-49.9, adult (HCC) Down 3lbs over the last few weeks. Tolerating wegovy, increase to 1mg  weekly. Reviewed recent labs. Continue diet and exercise efforts.  Elevated serum creatinine Stable on recent labs. Possibly new baseline. Continue to  monitor.  GERD (gastroesophageal reflux disease) Tolerable with BID pepcid and iberogast. May have also contributed to nausea episodes. Recent liver enzymes within normal limits, plan to monitor while on iberogast. Consider of addition of PPI in the future if needed however expect to improve with ongoing weight loss and avoidance of causative foods. Nonsmoker. No red flag symptoms.     [name], DO

## 2023-07-10 NOTE — Assessment & Plan Note (Signed)
Stable on recent labs. Possibly new baseline. Continue to monitor.

## 2023-07-10 NOTE — Assessment & Plan Note (Addendum)
Await CPAP study. Instructed to call and reschedule.

## 2023-07-10 NOTE — Assessment & Plan Note (Signed)
Down 3lbs over the last few weeks. Tolerating wegovy, increase to 1mg  weekly. Reviewed recent labs. Continue diet and exercise efforts.

## 2023-07-10 NOTE — Assessment & Plan Note (Addendum)
Tolerable with BID pepcid and iberogast. May have also contributed to nausea episodes. Recent liver enzymes within normal limits, plan to monitor while on iberogast. Consider of addition of PPI in the future if needed however expect to improve with ongoing weight loss and avoidance of causative foods. Nonsmoker. No red flag symptoms.

## 2023-07-10 NOTE — Assessment & Plan Note (Addendum)
Doing well on current regimen, no changes made today. Continue to follow with Cardiology.

## 2023-07-10 NOTE — Patient Instructions (Addendum)
It was great to see you!  Our plans for today:  - Call to reschedule your CPAP study. - We are increasing your wegovy dose.   Take care and seek immediate care sooner if you develop any concerns.   Dr. Linwood Dibbles

## 2023-07-12 ENCOUNTER — Other Ambulatory Visit (HOSPITAL_BASED_OUTPATIENT_CLINIC_OR_DEPARTMENT_OTHER): Payer: Self-pay

## 2023-07-13 ENCOUNTER — Encounter: Payer: Self-pay | Admitting: Family Medicine

## 2023-07-13 ENCOUNTER — Other Ambulatory Visit (HOSPITAL_BASED_OUTPATIENT_CLINIC_OR_DEPARTMENT_OTHER): Payer: Self-pay

## 2023-07-13 MED ORDER — SEMAGLUTIDE-WEIGHT MANAGEMENT 1 MG/0.5ML ~~LOC~~ SOAJ: 1.0000 mg | SUBCUTANEOUS | 0 refills | Status: DC

## 2023-07-13 NOTE — Telephone Encounter (Signed)
Patient requests prescription to be transferred to [facility] in [place].   Canceled prescription at [facility] and sent to [facility] per patient request.   Veronda Prude, RN

## 2023-08-05 ENCOUNTER — Telehealth: Payer: Commercial Managed Care - HMO | Admitting: Nurse Practitioner

## 2023-08-05 DIAGNOSIS — J020 Streptococcal pharyngitis: Secondary | ICD-10-CM

## 2023-08-05 MED ORDER — LIDOCAINE VISCOUS HCL 2 % MT SOLN
15.0000 mL | OROMUCOSAL | 0 refills | Status: AC | PRN
Start: 2023-08-05 — End: 2023-08-10

## 2023-08-05 MED ORDER — PREDNISONE 10 MG (21) PO TBPK
ORAL_TABLET | ORAL | 0 refills | Status: DC
Start: 2023-08-05 — End: 2023-09-21

## 2023-08-05 NOTE — Progress Notes (Signed)
Virtual Visit Consent   Summer Brewer, you are scheduled for a virtual visit with a [facility] provider today. Just as with appointments in the office, your consent must be obtained to participate. Your consent will be active for this visit and any virtual visit you may have with one of our providers in the next 365 days. If you have a MyChart account, a copy of this consent can be sent to you electronically.  As this is a virtual visit, video technology does not allow for your provider to perform a traditional examination. This may limit your provider's ability to fully assess your condition. If your provider identifies any concerns that need to be evaluated in person or the need to arrange testing (such as labs, EKG, etc.), we will make arrangements to do so. Although advances in technology are sophisticated, we cannot ensure that it will always work on either your end or our end. If the connection with a video visit is poor, the visit may have to be switched to a telephone visit. With either a video or telephone visit, we are not always able to ensure that we have a secure connection.  By engaging in this virtual visit, you consent to the provision of healthcare and authorize for your insurance to be billed (if applicable) for the services provided during this visit. Depending on your insurance coverage, you may receive a charge related to this service.  I need to obtain your verbal consent now. Are you willing to proceed with your visit today? Summer Brewer has provided verbal consent on 08/05/2023 for a virtual visit (video or telephone). Viviano Simas, FNP  Date: 08/05/2023 2:39 PM  Virtual Visit via Video Note   I, Viviano Simas, connected with  Summer Brewer  ([id number], 1971/09/08) on 08/05/23 at  2:45 PM EST by a video-enabled telemedicine application and verified that I am speaking with the correct person using two identifiers.  Location: Patient: Virtual Visit Location Patient:  Home Provider: Virtual Visit Location Provider: Home Office   I discussed the limitations of evaluation and management by telemedicine and the availability of in person appointments. The patient expressed understanding and agreed to proceed.    History of Present Illness: Summer Brewer is a 52 y.o. who identifies as a female who was assigned female at birth, and is being seen today for sore throat.   Symptom onset was two days ago   She was Seen at [facility] yesterday and was positive for strep  She was started on Amoxicillin twice daily liquid   She is in increased amounts of pain today  She was given liquid medicine due to swelling of her tonsils      Problems:  Patient Active Problem List   Diagnosis Date Noted   Precordial pain 06/10/2023   Cardiomyopathy (HCC) 06/10/2023   Dizziness 05/11/2023   Chronic combined systolic and diastolic heart failure (HCC); mildly reduced EF 04/10/2023   Syncope 03/26/2023   Elevated serum creatinine 11/14/2022   History of vaginal hysterectomy 10/29/2022   Obstructive sleep apnea 10/09/2022   Fibroids 07/15/2022   Hyperlipidemia 01/16/2022   Panic disorder 03/07/2021   Migraine 09/13/2019   Other complicated headache syndrome 01/02/2017   Blurred vision, bilateral 11/24/2016   Depressed mood 06/03/2016   Anxiety 12/22/2014   Eczema 01/28/2013   GERD (gastroesophageal reflux disease) 01/07/2011   Dermatitis 01/07/2011   BACK PAIN, LUMBAR 12/26/2008   Morbid obesity with BMI of 45.0-49.9, adult (HCC) 11/05/2006  ANEMIA, IRON DEFICIENCY, UNSPEC. 11/05/2006   Hypertension 11/05/2006    Allergies:  Allergies  Allergen Reactions   Latex Hives and Itching   Tizanidine Other (See Comments)    Mouth dryness   Medications:  Current Outpatient Medications:    albuterol (VENTOLIN HFA) 108 (90 Base) MCG/ACT inhaler, Inhale 2 puffs into the lungs every 4 (four) hours as needed., Disp: , Rfl:    cetirizine (ZYRTEC) 10 MG tablet, TAKE 1  TABLET BY MOUTH EVERY DAY, Disp: 90 tablet, Rfl: 0   dapagliflozin propanediol (FARXIGA) 10 MG TABS tablet, Take 1 tablet (10 mg total) by mouth daily., Disp: 30 tablet, Rfl: 3   famotidine (PEPCID) 40 MG tablet, Take 1 tablet (40 mg total) by mouth 2 (two) times daily., Disp: 60 tablet, Rfl: 5   fluticasone (FLONASE) 50 MCG/ACT nasal spray, Place 1 spray into both nostrils as needed., Disp: , Rfl:    Galcanezumab-gnlm (EMGALITY) 120 MG/ML SOAJ, INJECT 240 MG INTO THE SKIN ONCE FOR 1 DOSE. LOADING DOSE, Disp: 1 mL, Rfl: 1   halobetasol (ULTRAVATE) 0.05 % cream, APPLY TOPICALLY TWICE A DAY, Disp: 45 g, Rfl: 0   hydrOXYzine (ATARAX) 10 MG tablet, TAKE 1 TABLET (10 MG TOTAL) BY MOUTH AT BEDTIME AS NEEDED FOR ITCHING, Disp: 30 tablet, Rfl: 1   metoprolol succinate (TOPROL-XL) 100 MG 24 hr tablet, Take 1 tablet (100 mg total) by mouth at bedtime. Take with or immediately following a meal., Disp: 90 tablet, Rfl: 3   Misc Natural Products (IBEROGAST) LIQD, Take by mouth., Disp: , Rfl:    ondansetron (ZOFRAN) 4 MG tablet, Take 1 tablet (4 mg total) by mouth every 8 (eight) hours as needed for nausea or vomiting., Disp: 20 tablet, Rfl: 0   scopolamine (TRANSDERM-SCOP) 1 MG/3DAYS, Place 1 patch (1.5 mg total) onto the skin every 3 (three) days., Disp: 10 patch, Rfl: 5   Semaglutide-Weight Management 1 MG/0.5ML SOAJ, Inject 1 mg into the skin once a week., Disp: 2 mL, Rfl: 0   spironolactone (ALDACTONE) 25 MG tablet, Take 1 tablet (25 mg total) by mouth daily., Disp: 30 tablet, Rfl: 6   triamcinolone cream (KENALOG) 0.5 %, Apply 1 Application topically 2 (two) times daily as needed., Disp: , Rfl:    UBRELVY 100 MG TABS, TAKE 1 TABLET BY MOUTH AS NEEDED. MAY REPEAT IN 2 HOURS. MAXIMUM 2 TABLETS IN 24 HOURS, Disp: 10 tablet, Rfl: 5   valsartan (DIOVAN) 40 MG tablet, Take 1 tablet (40 mg total) by mouth daily., Disp: 30 tablet, Rfl: 6  Observations/Objective: Patient is well-developed, well-nourished in no acute  distress.  Resting comfortably  at home.  Head is normocephalic, atraumatic.  No labored breathing.  Speech is clear and coherent with logical content.  Patient is alert and oriented at baseline.    Assessment and Plan:  1. Strep throat  Continue  antibiotic as prescribed   May use tylenol for pain relief   - lidocaine (XYLOCAINE) 2 % solution; Use as directed 15 mLs in the mouth or throat every 4 (four) hours as needed for up to 5 days for mouth pain.  Dispense: 100 mL; Refill: 0 - predniSONE (STERAPRED UNI-PAK 21 TAB) 10 MG (21) TBPK tablet; Take 6 tablets on day one, 5 on day two, 4 on day three, 3 on day four, 2 on day five, and 1 on day six. Take with food.  Dispense: 21 tablet; Refill: 0    Follow Up Instructions: I discussed the assessment and treatment  plan with the patient. The patient was provided an opportunity to ask questions and all were answered. The patient agreed with the plan and demonstrated an understanding of the instructions.  A copy of instructions were sent to the patient via MyChart unless otherwise noted below.    The patient was advised to call back or seek an in-person evaluation if the symptoms worsen or if the condition fails to improve as anticipated.    Viviano Simas, FNP

## 2023-08-09 ENCOUNTER — Encounter: Payer: Self-pay | Admitting: Family Medicine

## 2023-08-10 MED ORDER — WEGOVY 1.7 MG/0.75ML ~~LOC~~ SOAJ: 1.7000 mg | SUBCUTANEOUS | 0 refills | Status: DC

## 2023-08-13 ENCOUNTER — Other Ambulatory Visit: Payer: Self-pay | Admitting: Family Medicine

## 2023-08-18 ENCOUNTER — Other Ambulatory Visit: Payer: Self-pay | Admitting: Family Medicine

## 2023-08-18 DIAGNOSIS — L299 Pruritus, unspecified: Secondary | ICD-10-CM

## 2023-08-20 ENCOUNTER — Other Ambulatory Visit: Payer: Self-pay | Admitting: Family Medicine

## 2023-08-20 DIAGNOSIS — L309 Dermatitis, unspecified: Secondary | ICD-10-CM

## 2023-08-21 ENCOUNTER — Encounter: Payer: Self-pay | Admitting: Family Medicine

## 2023-08-26 ENCOUNTER — Telehealth: Payer: Self-pay

## 2023-08-26 NOTE — Telephone Encounter (Signed)
Called patient regarding to prescription per Dr. Linwood Dibbles request.  LVM for patient to call back.  Will call later.   Drusilla Kanner, CMA

## 2023-08-26 NOTE — Telephone Encounter (Signed)
Called patient 2xs per Dr. Madelaine Etienne request about medication.  Patient did not answer, LVM to contact office back.  Drusilla Kanner, CMA

## 2023-09-04 ENCOUNTER — Other Ambulatory Visit: Payer: Self-pay | Admitting: Family Medicine

## 2023-09-10 ENCOUNTER — Other Ambulatory Visit: Payer: Self-pay | Admitting: Family Medicine

## 2023-09-10 DIAGNOSIS — L299 Pruritus, unspecified: Secondary | ICD-10-CM

## 2023-09-11 ENCOUNTER — Telehealth: Payer: Self-pay | Admitting: Cardiology

## 2023-09-11 NOTE — Telephone Encounter (Signed)
 Patient returned call. Verified name and DOB. Patient report she's been having more palpitations/ heart racing over the last month. She stated she had palpitations 12/30 and 12/31 at night, them 1/1 in the morning. She reports a weird feeling of lightheadedness. She stated she did not check her heart rate at those times. She could not provided BP reading because she was not near her monitor. Patient deny symptoms at this time. Scheduled patient for an appointment on 1/7 with Damien Braver, NP. Patent also advised per ED precautions if symptoms worsen or if new symptoms develop to go to the ED. Also advised her to keep BP and HR log and bring to her appointment. Patient verbalized understanding and agree.

## 2023-09-11 NOTE — Telephone Encounter (Signed)
 Patient is returning call. Transferred to [place], Charity fundraiser.

## 2023-09-11 NOTE — Telephone Encounter (Signed)
 Called patient with no answer. Left message to return call.

## 2023-09-11 NOTE — Telephone Encounter (Signed)
  Per MyChart scheduling message:   Initial complaint: I'm having some fluttering , funny feelings going on at night time when I lay down . Makes me feel weird . Little worried   Patient c/o Palpitations:  STAT if patient reporting lightheadedness, shortness of breath, or chest pain  How long have you had palpitations/irregular HR/ Afib? Are you having the symptoms now?   Are you currently experiencing lightheadedness, SOB or CP?   Do you have a history of afib (atrial fibrillation) or irregular heart rhythm?   Have you checked your BP or HR? (document readings if available):   Are you experiencing any other symptoms?    It just started happening it was two nights this week . Then I had it happen today . It starts beating really fast you can see it in my throat . No pain in my chest . Just really fast and makes me feel weird . I lay down or be still and finally it goes away . Just diagnosed with heart failure.

## 2023-09-14 ENCOUNTER — Ambulatory Visit: Payer: Commercial Managed Care - HMO | Admitting: Family Medicine

## 2023-09-15 ENCOUNTER — Other Ambulatory Visit: Payer: Self-pay | Admitting: Emergency Medicine

## 2023-09-15 ENCOUNTER — Ambulatory Visit (INDEPENDENT_AMBULATORY_CARE_PROVIDER_SITE_OTHER): Payer: Commercial Managed Care - HMO

## 2023-09-15 ENCOUNTER — Encounter: Payer: Self-pay | Admitting: Nurse Practitioner

## 2023-09-15 ENCOUNTER — Other Ambulatory Visit: Payer: Self-pay

## 2023-09-15 ENCOUNTER — Ambulatory Visit: Payer: Commercial Managed Care - HMO | Attending: Nurse Practitioner | Admitting: Emergency Medicine

## 2023-09-15 VITALS — BP 144/82 | HR 81 | Ht 67.0 in | Wt 270.4 lb

## 2023-09-15 DIAGNOSIS — R002 Palpitations: Secondary | ICD-10-CM

## 2023-09-15 DIAGNOSIS — I5042 Chronic combined systolic (congestive) and diastolic (congestive) heart failure: Secondary | ICD-10-CM

## 2023-09-15 DIAGNOSIS — I428 Other cardiomyopathies: Secondary | ICD-10-CM

## 2023-09-15 DIAGNOSIS — Z79899 Other long term (current) drug therapy: Secondary | ICD-10-CM

## 2023-09-15 DIAGNOSIS — I10 Essential (primary) hypertension: Secondary | ICD-10-CM

## 2023-09-15 DIAGNOSIS — G4733 Obstructive sleep apnea (adult) (pediatric): Secondary | ICD-10-CM

## 2023-09-15 DIAGNOSIS — E785 Hyperlipidemia, unspecified: Secondary | ICD-10-CM | POA: Diagnosis not present

## 2023-09-15 MED ORDER — SACUBITRIL-VALSARTAN 24-26 MG PO TABS: 1.0000 | ORAL_TABLET | Freq: Two times a day (BID) | ORAL | 11 refills | Status: DC

## 2023-09-15 NOTE — Patient Instructions (Addendum)
 Medication Instructions:  Start Entresto  24/26 take 1 tablet twice daily Stop Valsartan  as directed  *If you need a refill on your cardiac medications before your next appointment, please call your pharmacy*   Lab Work: CBC, TSH, Magnesium, BMET today & BMET, lipid panel in 2 weeks.    Testing/Procedures: Summer Brewer- Long Term Monitor Instructions  Your physician has requested you wear a ZIO patch monitor for 14 days.  This is a single patch monitor. [employer] supplies one patch monitor per enrollment. Additional stickers are not available. Please do not apply patch if you will be having a Nuclear Stress Test,  Echocardiogram, Cardiac CT, MRI, or Chest Xray during the period you would be wearing the  monitor. The patch cannot be worn during these tests. You cannot remove and re-apply the  ZIO XT patch monitor.  Your ZIO patch monitor will be mailed 3 day USPS to your address on file. It may take 3-5 days  to receive your monitor after you have been enrolled.  Once you have received your monitor, please review the enclosed instructions. Your monitor  has already been registered assigning a specific monitor serial # to you.  Billing and Patient Assistance Program Information  We have supplied [employer] with any of your insurance information on file for billing purposes. [employer] offers a sliding scale Patient Assistance Program for patients that do not have  insurance, or whose insurance does not completely cover the cost of the ZIO monitor.  You must apply for the Patient Assistance Program to qualify for this discounted rate.  To apply, please call [employer] at [phone], select option 4, select option 2, ask to apply for  Patient Assistance Program. Meredeth will ask your household income, and how many people  are in your household. They will quote your out-of-pocket cost based on that information.  [employer] will also be able to set up a 66-month, interest-free payment plan if  needed.  Applying the monitor   Shave hair from upper left chest.  Hold abrader disc by orange tab. Rub abrader in 40 strokes over the upper left chest as  indicated in your monitor instructions.  Clean area with 4 enclosed alcohol pads. Let dry.  Apply patch as indicated in monitor instructions. Patch will be placed under collarbone on left  side of chest with arrow pointing upward.  Rub patch adhesive wings for 2 minutes. Remove white label marked 1. Remove the white  label marked 2. Rub patch adhesive wings for 2 additional minutes.  While looking in a mirror, press and release button in center of patch. A small green light will  flash 3-4 times. This will be your only indicator that the monitor has been turned on.  Do not shower for the first 24 hours. You may shower after the first 24 hours.  Press the button if you feel a symptom. You will hear a small click. Record Date, Time and  Symptom in the Patient Logbook.  When you are ready to remove the patch, follow instructions on the last 2 pages of Patient  Logbook. Stick patch monitor onto the last page of Patient Logbook.  Place Patient Logbook in the blue and white box. Use locking tab on box and tape box closed  securely. The blue and white box has prepaid postage on it. Please place it in the mailbox as  soon as possible. Your physician should have your test results approximately 7 days after the  monitor has been mailed back to [facility].  Call [facility] Customer Care at [phone] if you have questions regarding  your ZIO XT patch monitor. Call them immediately if you see an orange light blinking on your  monitor.  If your monitor falls off in less than 4 days, contact our Monitor department at [phone].  If your monitor becomes loose or falls off after 4 days call [employer] at [phone] for  suggestions on securing your monitor    Follow-Up: At [facility], you and your health needs are our  priority.  As part of our continuing mission to provide you with exceptional heart care, we have created designated Provider Care Teams.  These Care Teams include your primary Cardiologist (physician) and Advanced Practice Providers (APPs -  Physician Assistants and Nurse Practitioners) who all work together to provide you with the care you need, when you need it.  We recommend signing up for the patient portal called MyChart.  Sign up information is provided on this After Visit Summary.  MyChart is used to connect with patients for Virtual Visits (Telemedicine).  Patients are able to view lab/test results, encounter notes, upcoming appointments, etc.  Non-urgent messages can be sent to your provider as well.   To learn more about what you can do with MyChart, go to [email or web address].    Your next appointment:   6-8 week(s)  Provider:   Alm Clay, MD  or Damien Braver, NP

## 2023-09-15 NOTE — Progress Notes (Unsigned)
 Enrolled for [employer] to mail a ZIO XT long term holter monitor to the patients address on file.   Dr. Herbie Baltimore to read.

## 2023-09-15 NOTE — Progress Notes (Signed)
 Cardiology Office Note:    Date:  09/15/2023  ID:  [name], DOB 1970-11-21, MRN [id number] PCP: Summer Donald CHRISTELLA, DO   [facility] Providers Cardiologist:  Alm Clay, MD       Patient Profile:      Summer Brewer. Turcott is a 53 year old female with visit pertinent history of nonischemic cardiomyopathy, chronic combined systolic and diastolic heart failure, hyperlipidemia, hypertension, morbid obesity, OSA  She established with cardiology service on 06/10/2023 for new onset HFrEF.    She had been following with her family medicine doctor for syncope and dizziness and echocardiogram and heart monitor were obtained.  She had an echocardiogram in July 2024 that revealed an EF of 40-45% with global hypokinesis, grade 1 DD and mildly reduced RV function.  She was started on metoprolol  and Jardiance  by her PCP.  Heart monitor was completed on 04/2023 showing predominant rhythm as sinus with rare PACs and rare PVCs with no arrhythmias noted.  She was referred to cardiology service.  She saw Dr. Clay on 06/10/2023 and she was experiencing NYHA class IIb symptoms with exertional dyspnea and chest tightness.  She was started on spironolactone  and valsartan  and continued on metoprolol  along with Farxiga .  Coronary CT was completed on 06/19/2023 showing mild nonobstructive soft plaque in LAD (25-49%), a coronary calcium  score of 0 and total plaque volume of 158 which is 87 percentile for age and sex.  She was seen by Dr. Clay on follow-up on 06/29/2023 she had reported significant improvement in her dizziness and lightheadedness since starting on her new medications.  However she did report ongoing issues with shortness of breath particularly when laying flat.  She had been on Wegovy  for approximately 7 weeks reported some initial nausea with the medication.  She was awaiting approval for a CPAP machine for recently diagnosed sleep apnea.  On 09/11/2023 patient had called the office noting  frequent palpitations and heart racing over the past week.  She also noted an episode of lightheadedness she was notified to make a follow-up appointment.      History of Present Illness:  Discussed the use of AI scribe software for clinical note transcription with the patient, who gave verbal consent to proceed.  Summer Brewer is a 53 y.o. female who returns for palpitations, chronic systolic and diastolic HF, HTN.   The patient, with a history of heart failure, presents with palpitations that occurred twice in the past week. The episodes were characterized by a rapid heart rate and a sensation of 'blacking out.' The patient describes the sensation as something doesn't feel right,' and ' I was going to fall out.' The episodes only occurred at night while the patient was lying down and lasted approximately 18 minutes and then 11-12 minutes respectively. The episodes were associated with dizziness, near syncope, and nausea.  The patient did not experience any symptoms during the day. She denies any known aggravating or alleviating factors. She denies any CP, exertional angina, dyspnea, or syncope. She is very concerned as these two episodes were similar to the symptoms she experienced back in July which is what led to her first Echos showing her LV dysfunction.   The patient has been taking Wegovy , and has lost 18 pounds. She has been experiencing nausea, which she attributes to the Wegovy . The patient also reports a history of acid reflux, which has worsened since starting the Wegovy .    Review of Systems  Constitutional: Negative for weight gain and weight loss.  Cardiovascular:  Positive for near-syncope and palpitations. Negative for chest pain, claudication, dyspnea on exertion, irregular heartbeat, leg swelling, orthopnea, paroxysmal nocturnal dyspnea and syncope.  Respiratory:  Negative for cough, hemoptysis and shortness of breath.   Gastrointestinal:  Positive for nausea. Negative for  abdominal pain, hematochezia and melena.  Genitourinary:  Negative for hematuria.  Neurological:  Positive for light-headedness. Negative for dizziness.     See HPI    Studies Reviewed:   EKG Interpretation Date/Time:  Tuesday September 15 2023 13:34:17 EST Ventricular Rate:  81 PR Interval:  168 QRS Duration:  86 QT Interval:  362 QTC Calculation: 420 R Axis:   45  Text Interpretation: Normal sinus rhythm Confirmed by Rana Dixon [phone]) on 09/15/2023 4:57:19 PM    Coronary CTA 06/19/2023  IMPRESSION: 1. Coronary calcium  score of 0. This was <1 percentile for age and sex matched control.   2. Total plaque volume (TPV) 158 mm3 which is 87th percentile for age-and sex matched controls (calcified plaque 2 mm3; non-calcified plaque 156 mm3). TPV is moderate.   3. Normal coronary origin with right dominance.   4. Mild non obstructive soft plaque LAD.   CAD-RADS 2. Mild non-obstructive CAD (25-49%). Consider non-atherosclerotic causes of chest pain. Consider preventive therapy and risk factor modification.  ZIO 04/10/2023  Patch Wear Time:  6 days and 21 hours (2024-08-07T10:04:18-0400 to 2024-08-14T08:02:06-399)   Patient had a min HR of 45 bpm, max HR of 121 bpm, and avg HR of 72 bpm. Predominant underlying rhythm was Sinus Rhythm. Isolated SVEs were rare (<1.0%), SVE Couplets were rare (<1.0%), and no SVE Triplets were present. Isolated VEs were rare (<1.0%),  and no VE Couplets or VE Triplets were present.  Echocardiogram 04/08/2023 1. Left ventricular ejection fraction, by estimation, is 40 to 45%. The  left ventricle has mildly decreased function. The left ventricle  demonstrates global hypokinesis. There is mild left ventricular  hypertrophy. Left ventricular diastolic parameters  are consistent with Grade I diastolic dysfunction (impaired relaxation).   2. Right ventricular systolic function is mildly reduced. The right  ventricular size is normal. Tricuspid  regurgitation signal is inadequate  for assessing PA pressure.   3. The mitral valve is normal in structure. Trivial mitral valve  regurgitation. No evidence of mitral stenosis.   4. The aortic valve was not well visualized. Aortic valve regurgitation  is not visualized. No aortic stenosis is present.   5. The inferior vena cava is normal in size with greater than 50%  respiratory variability, suggesting right atrial pressure of 3 mmHg.  Risk Assessment/Calculations:     HYPERTENSION CONTROL Vitals:   09/15/23 1329 09/15/23 1403  BP: (!) 150/90 (!) 144/82    The patient's blood pressure is elevated above target today.  In order to address the patient's elevated BP: A current anti-hypertensive medication was adjusted today.          Physical Exam:   VS:  BP (!) 144/82   Pulse 81   Ht 5' 7 (1.702 m)   Wt 270 lb 6.4 oz (122.7 kg)   LMP 08/10/2022 (Approximate)   SpO2 100%   BMI 42.35 kg/m    Wt Readings from Last 3 Encounters:  09/15/23 270 lb 6.4 oz (122.7 kg)  07/10/23 281 lb 12.8 oz (127.8 kg)  06/29/23 284 lb (128.8 kg)    Constitutional:      Appearance: Normal and healthy appearance.  HENT:     Head: Normocephalic.  Neck:  Vascular: JVD normal.  Pulmonary:     Effort: Pulmonary effort is normal.     Breath sounds: Normal breath sounds.  Chest:     Chest wall: Not tender to palpatation.  Cardiovascular:     PMI at left midclavicular line. Normal rate. Regular rhythm. Normal S1. Normal S2.      Murmurs: There is no murmur.     No gallop.  No click. No rub.  Pulses:    Intact distal pulses.  Edema:    Peripheral edema absent.  Musculoskeletal: Normal range of motion.     Cervical back: Normal range of motion and neck supple. Skin:    General: Skin is warm and dry.  Neurological:     General: No focal deficit present.     Mental Status: Alert, oriented to person, place, and time and oriented to person, place and time.  Psychiatric:        Attention and  Perception: Attention and perception normal.        Mood and Affect: Mood normal.        Behavior: Behavior is cooperative.        Thought Content: Thought content normal.        Assessment and Plan:  Chronic combined systolic and diastolic heart failure / NICM -She is euvolemic and well-compensated. NYHA class II -Echo 03/2023 shows LVEF 40-45% w/ global hypokinesis and grade 1 DD.  Coronary CTA 06/2023 showing mild non-obstructive CAD and coronary calcium  score of 0.  -NICM likely secondary to HTN and obesity.  -Her symptoms have improved since last visit, as she no longer experiences DOE or orthopnea. She does not require loop diuretic.  -Plan to add Entresto  and d/c Valsartan  to escalate her GDMT.  -Start Entresto  24-26mg  twice daily. Continue Farxiga  10mg  daily, Metoprolol -XL 100mg  daily, Spironolactone  25mg  daily   Palpitations -Two episodes were characterized by a rapid heart rate lasting 18 minutes and then 12 minutes with associated near-syncope, lightheadedness, and nausea. These occurred at nighttime while at rest. She noted this was similar to her episode in July.  -Her EKG today is NSR with no ST/T wave changes  -ZIO in 04/2023 shows NSR with rare PAC/PVCs and no arrhythmias  -Plan for 14-day ZIO and BMET, TSH, Mag, CBC and BMET x2 weeks -On metoprolol -XL 100mg  daily  Hypertension  -BP today is uncontrolled at 150/90 and repeat 144/82. Plan to switch Valsartan  to Entresto  as noted above. Continue to monitor BP at home for goal <130/80.  -Continue Metoprolol -XL 100mg  daily, Spironolactone  25mg  daily. Start Entresto  24-25mg  BID  Hyperlipidemia  -LDL on 01/2022 was 113. Plan for her to get fasting lipid panel in x1 week when she comes for her repeat BMET. She is not currently on lipid lowering therapy. Her CTA showed TPV of 158 on 06/2023 so her LDL goal is <70. If LDL remains elevated above goal would recommend initiation of statin therapy.   OSA -Nonadherent as she does have  her CPAP yet. Plans to speak with PCP regarding this next week.   Obesity -BMI 42.35. She is managed on Wegovy  for weight loss. She has associated nausea and heartburn. Followed by PCP.            Dispo:  Follow-up in 6 to 8 weeks.   Signed, Lum LITTIE Louis, NP

## 2023-09-16 LAB — CBC
Hematocrit: 39.3 % (ref 34.0–46.6)
Hemoglobin: 12.6 g/dL (ref 11.1–15.9)
MCH: 27.3 pg (ref 26.6–33.0)
MCHC: 32.1 g/dL (ref 31.5–35.7)
MCV: 85 fL (ref 79–97)
Platelets: 203 10*3/uL (ref 150–450)
RBC: 4.62 x10E6/uL (ref 3.77–5.28)
RDW: 13.7 % (ref 11.7–15.4)
WBC: 5.7 10*3/uL (ref 3.4–10.8)

## 2023-09-16 LAB — BASIC METABOLIC PANEL
BUN/Creatinine Ratio: 13 (ref 9–23)
BUN: 13 mg/dL (ref 6–24)
CO2: 24 mmol/L (ref 20–29)
Calcium: 9.3 mg/dL (ref 8.7–10.2)
Chloride: 104 mmol/L (ref 96–106)
Creatinine, Ser: 1.01 mg/dL — ABNORMAL HIGH (ref 0.57–1.00)
Glucose: 81 mg/dL (ref 70–99)
Potassium: 3.8 mmol/L (ref 3.5–5.2)
Sodium: 144 mmol/L (ref 134–144)
eGFR: 68 mL/min/{1.73_m2} (ref >59)

## 2023-09-16 LAB — MAGNESIUM: Magnesium: 2 mg/dL (ref 1.6–2.3)

## 2023-09-16 LAB — TSH: TSH: 2.16 u[IU]/mL (ref 0.450–4.500)

## 2023-09-18 ENCOUNTER — Telehealth: Payer: Self-pay

## 2023-09-18 NOTE — Telephone Encounter (Signed)
 Lab results mailed to pt.

## 2023-09-21 ENCOUNTER — Ambulatory Visit (INDEPENDENT_AMBULATORY_CARE_PROVIDER_SITE_OTHER): Payer: Commercial Managed Care - HMO | Admitting: Family Medicine

## 2023-09-21 ENCOUNTER — Encounter: Payer: Self-pay | Admitting: Family Medicine

## 2023-09-21 VITALS — BP 124/80 | HR 89 | Ht 67.0 in | Wt 269.4 lb

## 2023-09-21 DIAGNOSIS — Z6841 Body Mass Index (BMI) 40.0 and over, adult: Secondary | ICD-10-CM | POA: Diagnosis not present

## 2023-09-21 DIAGNOSIS — G4733 Obstructive sleep apnea (adult) (pediatric): Secondary | ICD-10-CM

## 2023-09-21 DIAGNOSIS — Z113 Encounter for screening for infections with a predominantly sexual mode of transmission: Secondary | ICD-10-CM

## 2023-09-21 MED ORDER — WEGOVY 2.4 MG/0.75ML ~~LOC~~ SOAJ: 2.4000 mg | SUBCUTANEOUS | 0 refills | Status: DC

## 2023-09-21 NOTE — Assessment & Plan Note (Signed)
 Wegovy titrated up today. Continue diet and exercise efforts.

## 2023-09-21 NOTE — Patient Instructions (Addendum)
 [facility] [address] [place], [place] [address] [phone] Call for appointment.   Call for CPAP appointment.   We are increasing your Wegovy  to 2.4mg .  We are checking some labs, we will release these to your MyChart.   Come back in 1 month.

## 2023-09-21 NOTE — Progress Notes (Signed)
   SUBJECTIVE:   CHIEF COMPLAINT / HPI:   OBESITY - Meds: wegovy  - Previously on none - Complications of obesity: HFrEF, HTN, HLD, OSA, prediabetes - Peak weight: 292lb - Weight loss to date: 11 lb since initiating wegovy , 22lb total - has had good effort with diet and exercise as able - does not have contraindication to GLP-1 agonist, no h/o medullary thyroid  cancer or MEN2. - not pregnant or lactating. - no concurrent use of GLP-1 agonist. - denies nausea, vomiting.   OSA - needs CPAP titration study, hasn't heard about appointment yet. Has number to call and schedule.   STI check - wanting blood test to check for STI. No abnormal vaginal discharge, rashes, ulcers, declines vaginal swab. No new partners.    OBJECTIVE:   BP 124/80   Pulse 89   Ht 5' 7 (1.702 m)   Wt 269 lb 6.4 oz (122.2 kg)   LMP 08/10/2022 (Approximate)   SpO2 98%   BMI 42.19 kg/m   Gen: well appearing, in NAD Card: RRR Lungs: CTAB Ext: WWP, no edema   ASSESSMENT/PLAN:   Obstructive sleep apnea Await CPAP study. Instructed to call and reschedule.   Morbid obesity with BMI of 45.0-49.9, adult (HCC) Wegovy  titrated up today. Continue diet and exercise efforts.    STI check Only wants bloodwork, declines vaginal swab. Hep C, RPR, HIV. Encourage condom use.   F/u 1 month for obesity.   Summer CHRISTELLA Lai, DO

## 2023-09-21 NOTE — Assessment & Plan Note (Signed)
 Await CPAP study. Instructed to call and reschedule.

## 2023-09-22 ENCOUNTER — Encounter: Payer: Self-pay | Admitting: Family Medicine

## 2023-09-23 ENCOUNTER — Encounter: Payer: Self-pay | Admitting: Family Medicine

## 2023-09-23 LAB — RPR: RPR Ser Ql: NONREACTIVE

## 2023-09-23 LAB — HIV ANTIBODY (ROUTINE TESTING W REFLEX)

## 2023-09-23 LAB — HEPATITIS C ANTIBODY: Hep C Virus Ab: NONREACTIVE

## 2023-09-30 LAB — LIPID PANEL
Chol/HDL Ratio: 5.9 {ratio} — ABNORMAL HIGH (ref 0.0–4.4)
Cholesterol, Total: 207 mg/dL — ABNORMAL HIGH (ref 100–199)
HDL: 35 mg/dL — ABNORMAL LOW (ref >39)
LDL Chol Calc (NIH): 138 mg/dL — ABNORMAL HIGH (ref 0–99)
Triglycerides: 186 mg/dL — ABNORMAL HIGH (ref 0–149)
VLDL Cholesterol Cal: 34 mg/dL (ref 5–40)

## 2023-09-30 LAB — BASIC METABOLIC PANEL
BUN/Creatinine Ratio: 10 (ref 9–23)
BUN: 11 mg/dL (ref 6–24)
CO2: 25 mmol/L (ref 20–29)
Calcium: 9.7 mg/dL (ref 8.7–10.2)
Chloride: 101 mmol/L (ref 96–106)
Creatinine, Ser: 1.06 mg/dL — ABNORMAL HIGH (ref 0.57–1.00)
Glucose: 102 mg/dL — ABNORMAL HIGH (ref 70–99)
Potassium: 4 mmol/L (ref 3.5–5.2)
Sodium: 140 mmol/L (ref 134–144)
eGFR: 64 mL/min/{1.73_m2} (ref >59)

## 2023-09-30 LAB — COMPREHENSIVE METABOLIC PANEL
ALT: 10 [IU]/L (ref 0–32)
AST: 13 [IU]/L (ref 0–40)
Albumin: 4.5 g/dL (ref 3.9–4.9)
Alkaline Phosphatase: 74 [IU]/L (ref 44–121)
BUN/Creatinine Ratio: 9 (ref 9–23)
BUN: 10 mg/dL (ref 6–24)
Bilirubin Total: 0.3 mg/dL (ref 0.0–1.2)
CO2: 24 mmol/L (ref 20–29)
Calcium: 9.8 mg/dL (ref 8.7–10.2)
Chloride: 102 mmol/L (ref 96–106)
Creatinine, Ser: 1.1 mg/dL — ABNORMAL HIGH (ref 0.57–1.00)
Globulin, Total: 3.2 g/dL (ref 1.5–4.5)
Glucose: 105 mg/dL — ABNORMAL HIGH (ref 70–99)
Potassium: 3.9 mmol/L (ref 3.5–5.2)
Sodium: 145 mmol/L — ABNORMAL HIGH (ref 134–144)
Total Protein: 7.7 g/dL (ref 6.0–8.5)
eGFR: 61 mL/min/{1.73_m2} (ref >59)

## 2023-10-02 ENCOUNTER — Telehealth (HOSPITAL_BASED_OUTPATIENT_CLINIC_OR_DEPARTMENT_OTHER): Payer: Self-pay

## 2023-10-02 ENCOUNTER — Encounter (HOSPITAL_BASED_OUTPATIENT_CLINIC_OR_DEPARTMENT_OTHER): Payer: Self-pay

## 2023-10-02 MED ORDER — ROSUVASTATIN CALCIUM 10 MG PO TABS: 10.0000 mg | ORAL_TABLET | Freq: Every day | ORAL | 3 refills | Status: DC

## 2023-10-02 NOTE — Telephone Encounter (Signed)
Spoke with pt. Pt was notified of lab results and recommendations of starting Crestor 10 mg daily. Pt will voiced understanding. RX sent to pts Pharmacy.

## 2023-10-03 ENCOUNTER — Other Ambulatory Visit: Payer: Self-pay | Admitting: Family Medicine

## 2023-10-12 NOTE — Progress Notes (Signed)
 NEUROLOGY FOLLOW UP OFFICE NOTE  Summer Brewer [id number]  Assessment/Plan:   Migraine without aura, without status migrainosus, not intractable Hypertension   Migraine prevention:  Defer for now as frequency decreased Migraine rescue:  Prescribe Nurtec PRN; Zofran  4mg  for nausea  Limit use of pain relievers to no more than 2 days out of week to prevent risk of rebound or medication-overuse headache. Keep headache diary Follow up with PCP regarding blood pressure Follow up 6 months       Subjective:  Summer Brewer is a 53 year old right-handed female with HTN who follows up for migraines.  UPDATE: Switched from Emgality  ot Vyepti.  However, she never received a call to schedule.   Nurtec is effective and does not cause tinnitus.   Even though she has not been on a preventative, migraine frequency has improved.  May be due to improvement in blood pressure.  Cardiac workup for dizziness revealed cardiomyopathy.    Intensity:  severe Duration:  1 day with Excedrin (35-40 minutes with Nurtec) Frequency:  2-3 a month Current NSAIDS/analgesics:  none Current triptans:  none Current ergotamine:  none Current anti-emetic:  Zofran  4mg  Current muscle relaxants:  tizanidien 4mg  PRN Current Antihypertensive medications:  metoprolol , amlodipine , HCTZ Current Antidepressant medications:  sertaline 25mg  Current Anticonvulsant medications:  none Current anti-CGRP:none Current Vitamins/Herbal/Supplements:  none Current Antihistamines/Decongestants:  cetirizine , Flonase , hydroxyzine  Other therapy:  none  Caffeine:  rarely coffee Diet:  Drinks Sprite.  Does not drink water.  Sometimes skips breakfast Exercise:  no Depression:  no; Anxiety:  some Other pain:  no Sleep hygiene:  poor.  Trouble falling asleep.  Sleeps no more than 5 hours a night.  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  HISTORY:  Onset:  in her 70s Location:  left  frontal/occipital Quality:  pounding Intensity:  severe.   Aura:  absent Prodrome:  absent Associated symptoms:  Nausea, sometimes vomiting, numbness on back of head, photophobia, phonophobia, sees floaters.  She denies associated unilateral numbness or weakness. Duration:  2 days Frequency:  3 times a month (total 6 headache days a month) Frequency of abortive medication: three times a month Triggers:  Possibly perfumes Relieving factors:  rest in dark and quiet room Activity:  aggravates   MRI of brain without contrast on 11/28/2016 showed evidence of pansinusitis but no abnormal intracranial abnormality.   Past NSAIDS/analgesics:  diclofenac , meloxicam, naparoxen, ibuprofen , tramadol Past abortive triptans:  sumatriptan  tab, eletriptan  Past abortive ergotamine:  none Past muscle relaxants:  cyclobenzaprine  Past anti-emetic:  metoclopramide  Past antihypertensive medications:  propranolol , lisinopril , losartam Past antidepressant medications:  venlafaxine , dozepin, escitalopram , fluoxetine , citalopram   Past anticonvulsant medications:  topiramate Past anti-CGRP:  Emgality , Ubrelvy , Nurtec PRN (effective) Other past therapies:  Medrol  dose pack    Family history of headache:  no  PAST MEDICAL HISTORY: Past Medical History:  Diagnosis Date   Anemia    Anxiety    Back pain    CAD (coronary artery disease)    Cardiomyopathy (HCC) 03/2023   Echo with EF 40 to 45% global hypokinesis.   CHF (congestive heart failure) (HCC)    Eczema    Fibroid    GERD (gastroesophageal reflux disease)    Hypertension    Migraines    Post-operative state 09/30/2022   Preterm labor    Umbilical hernia    Vaginal Pap smear, abnormal     MEDICATIONS: Current Outpatient Medications on File Prior to Visit  Medication  Sig Dispense Refill   rosuvastatin  (CRESTOR ) 10 MG tablet Take 1 tablet (10 mg total) by mouth daily. 30 tablet 3   albuterol  (VENTOLIN  HFA) 108 (90 Base) MCG/ACT inhaler Inhale  2 puffs into the lungs every 4 (four) hours as needed.     cetirizine  (ZYRTEC ) 10 MG tablet TAKE 1 TABLET BY MOUTH EVERY DAY 90 tablet 0   dapagliflozin  propanediol (FARXIGA ) 10 MG TABS tablet TAKE 1 TABLET BY MOUTH EVERY DAY 90 tablet 3   famotidine  (PEPCID ) 40 MG tablet Take 1 tablet (40 mg total) by mouth 2 (two) times daily. 60 tablet 5   fluticasone  (FLONASE ) 50 MCG/ACT nasal spray Place 1 spray into both nostrils as needed.     Galcanezumab -gnlm (EMGALITY ) 120 MG/ML SOAJ INJECT 240 MG INTO THE SKIN ONCE FOR 1 DOSE. LOADING DOSE 1 mL 1   hydrOXYzine  (ATARAX ) 10 MG tablet TAKE 1/2 TABLET BY MOUTH EVERY DAY AT BEDTIME 15 tablet 2   metoprolol  succinate (TOPROL -XL) 100 MG 24 hr tablet Take 1 tablet (100 mg total) by mouth at bedtime. Take with or immediately following a meal. 90 tablet 3   Misc Natural Products (IBEROGAST) LIQD Take by mouth.     ondansetron  (ZOFRAN ) 4 MG tablet Take 1 tablet (4 mg total) by mouth every 8 (eight) hours as needed for nausea or vomiting. 20 tablet 0   sacubitril -valsartan  (ENTRESTO ) 24-26 MG Take 1 tablet by mouth 2 (two) times daily. 60 tablet 11   scopolamine  (TRANSDERM-SCOP) 1 MG/3DAYS Place 1 patch (1.5 mg total) onto the skin every 3 (three) days. 10 patch 5   Semaglutide -Weight Management (WEGOVY ) 2.4 MG/0.75ML SOAJ Inject 2.4 mg into the skin once a week. 3 mL 0   spironolactone  (ALDACTONE ) 25 MG tablet Take 1 tablet (25 mg total) by mouth daily. 30 tablet 6   triamcinolone  cream (KENALOG ) 0.5 % APPLY TOPICALLY 2 (TWO) TIMES DAILY AS NEEDED (ECZEMA, ITCHING). 15 g 2   UBRELVY  100 MG TABS TAKE 1 TABLET BY MOUTH AS NEEDED. MAY REPEAT IN 2 HOURS. MAXIMUM 2 TABLETS IN 24 HOURS 10 tablet 5   No current facility-administered medications on file prior to visit.    ALLERGIES: Allergies  Allergen Reactions   Latex Hives and Itching   Tizanidine  Other (See Comments)    Mouth dryness    FAMILY HISTORY: Family History  Problem Relation Age of Onset    Hypertension Mother    Deep vein thrombosis Mother    Hypertension Father    Cancer - Prostate Father    Cancer Paternal Grandmother    Hypertension Other    Deep vein thrombosis Other    Leukemia Other    Colon cancer Neg Hx    Colon polyps Neg Hx    Esophageal cancer Neg Hx    Stomach cancer Neg Hx    Rectal cancer Neg Hx       Objective:  Blood pressure (!) 145/99, pulse 90, height 5' 7 (1.702 m), weight 268 lb (121.6 kg), last menstrual period 08/10/2022, SpO2 97%. General: No acute distress.  Patient appears well-groomed.     Juliene Dunnings, DO  CC: Donald Lai, DO

## 2023-10-13 ENCOUNTER — Ambulatory Visit: Payer: Commercial Managed Care - HMO | Admitting: Neurology

## 2023-10-13 ENCOUNTER — Encounter: Payer: Self-pay | Admitting: Neurology

## 2023-10-13 VITALS — BP 145/99 | HR 90 | Ht 67.0 in | Wt 268.0 lb

## 2023-10-13 DIAGNOSIS — I10 Essential (primary) hypertension: Secondary | ICD-10-CM | POA: Diagnosis not present

## 2023-10-13 DIAGNOSIS — G43109 Migraine with aura, not intractable, without status migrainosus: Secondary | ICD-10-CM | POA: Diagnosis not present

## 2023-10-13 MED ORDER — NURTEC 75 MG PO TBDP: 1.0000 | ORAL_TABLET | Freq: Every day | ORAL | 5 refills | Status: DC | PRN

## 2023-10-13 MED ORDER — ONDANSETRON HCL 4 MG PO TABS: 4.0000 mg | ORAL_TABLET | Freq: Three times a day (TID) | ORAL | 5 refills | Status: DC | PRN

## 2023-10-13 NOTE — Patient Instructions (Signed)
 Hold off on starting another preventative At earliest onset of migraine, take Nurtec (1 daily as needed).  Ondansetron  for nausea Limit use of pain relievers to no more than 2 days out of week to prevent risk of rebound or medication-overuse headache. Keep headache diary Follow up 6 months.

## 2023-10-15 ENCOUNTER — Telehealth: Payer: Self-pay | Admitting: Pharmacy Technician

## 2023-10-15 ENCOUNTER — Other Ambulatory Visit (HOSPITAL_COMMUNITY): Payer: Self-pay

## 2023-10-15 NOTE — Telephone Encounter (Signed)
 Pharmacy Patient Advocate Encounter   Received notification from CoverMyMeds that prior authorization for NURTEC 75MG  is required/requested.   Insurance verification completed.   The patient is insured through [facility] .   Per test claim: PA required; PA submitted to above mentioned insurance via CoverMyMeds Key/confirmation #/EOC [id number] Status is pending

## 2023-10-15 NOTE — Telephone Encounter (Signed)
 Pharmacy Patient Advocate Encounter   Received notification from CoverMyMeds that prior authorization for NURTEC 75MG  is required/requested.   Insurance verification completed.   The patient is insured through [employer] .   CMM cancelled PA request due to non-payment

## 2023-10-16 NOTE — Telephone Encounter (Signed)
 Patient advised via mychart.

## 2023-10-27 ENCOUNTER — Other Ambulatory Visit: Payer: Self-pay | Admitting: Family Medicine

## 2023-10-30 ENCOUNTER — Ambulatory Visit: Payer: Medicaid Other | Attending: Cardiology | Admitting: Cardiology

## 2023-10-30 ENCOUNTER — Encounter: Payer: Self-pay | Admitting: Cardiology

## 2023-10-30 VITALS — BP 134/88 | HR 81 | Ht 67.0 in | Wt 267.6 lb

## 2023-10-30 DIAGNOSIS — R7303 Prediabetes: Secondary | ICD-10-CM

## 2023-10-30 DIAGNOSIS — E782 Mixed hyperlipidemia: Secondary | ICD-10-CM

## 2023-10-30 DIAGNOSIS — I5042 Chronic combined systolic (congestive) and diastolic (congestive) heart failure: Secondary | ICD-10-CM | POA: Diagnosis not present

## 2023-10-30 DIAGNOSIS — I110 Hypertensive heart disease with heart failure: Secondary | ICD-10-CM | POA: Diagnosis not present

## 2023-10-30 DIAGNOSIS — G4733 Obstructive sleep apnea (adult) (pediatric): Secondary | ICD-10-CM | POA: Diagnosis not present

## 2023-10-30 DIAGNOSIS — I43 Cardiomyopathy in diseases classified elsewhere: Secondary | ICD-10-CM

## 2023-10-30 DIAGNOSIS — Z6841 Body Mass Index (BMI) 40.0 and over, adult: Secondary | ICD-10-CM

## 2023-10-30 MED ORDER — SACUBITRIL-VALSARTAN 49-51 MG PO TABS: 1.0000 | ORAL_TABLET | Freq: Two times a day (BID) | ORAL | 3 refills | Status: DC

## 2023-10-30 NOTE — Progress Notes (Signed)
 Cardiology Office Note:  .   Date:  11/04/2023  ID:  Summer Brewer, DOB 1970/11/13, MRN [id number] PCP: [name], DO  [facility] Providers Cardiologist:  Bryan Lemma, MD     Chief Complaint  Patient presents with   Follow-up    Doing well.  Still having weight loss.  Energy level good.   Cardiomyopathy    Hypertensive cardiomyopathy-no real CHF symptoms.    Patient Profile: .     Summer Brewer is a morbidly obese 53 y.o. 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     I last saw Summer Brewer was last seen on June 29, 2023-noted to be NYHA class IIa symptoms.  She has been started on Wegovy plan was to transition to [facility] in 3 months pending response to [facility].  She was last seen on September 15, 2023 by Rise Paganini, NP with complaints of palpitations.  He converted her to [facility] and checked a Zio patch monitor.  Subjective  Discussed the use of AI scribe software for clinical note transcription with the patient, who gave verbal consent to proceed.  Was converted from valsartan to Entresto 24-26 mg daily along with Toprol 100 mg daily and spironolactone 25 mg daily, Farxiga 10 mg daily.  History of Present Illness   Summer Brewer is a 53 year old female with heart failure and hypertension who presents for follow-up regarding her heart condition and medication management.  She was recently switched from valsartan to [facility] for her heart failure and hypertension, and she is doing well on this medication. Her diastolic blood pressure remains slightly elevated at 93 mmHg, and her systolic pressure is at the upper limit at 136 mmHg. She is taking Toprol, which has improved her palpitations. She attributes some palpitations to stress and lack of sleep.  A coronary CT scan showed a low coronary  calcium score, indicating minimal plaque. Her cholesterol was recently found to be high, leading to the initiation of rosuvastatin by her primary care physician. She is currently on a starting dose of rosuvastatin.  She is on [place] for heart failure, diabetes, and kidney disease management. She experiences acid reflux and nausea as side effects of Wegovy, which she is taking for weight management. Despite these side effects, she has lost approximately 40 pounds.  Her A1c is 5.9, placing her in the prediabetes range. She is not a smoker and is working on her Ecologist with [employer].  She has not yet been set up for CPAP therapy, which is in process with her regular doctor. She uses four pillows to sleep, partly due to heartburn.      Cardiovascular ROS: no chest pain or dyspnea on exertion positive for - maybe some deconditioning related fatigue but is working on weight loss doing well. negative for - edema, irregular heartbeat, orthopnea, palpitations, paroxysmal nocturnal dyspnea, rapid heart rate, shortness of breath, or lightheadedness, dizziness or wooziness, syncope or near-syncope, TIA or amaurosis fugax, claudication    Objective   Current Meds  Medication Sig   albuterol (VENTOLIN HFA) 108 (90 Base) MCG/ACT inhaler Inhale 2 puffs into the lungs every 4 (four) hours as needed.   cetirizine (ZYRTEC) 10 MG tablet TAKE 1 TABLET BY MOUTH EVERY DAY   dapagliflozin propanediol (FARXIGA) 10 MG TABS tablet TAKE 1 TABLET BY MOUTH  EVERY DAY   famotidine (PEPCID) 40 MG tablet Take 1 tablet (40 mg total) by mouth 2 (two) times daily.   fluticasone (FLONASE) 50 MCG/ACT nasal spray Place 1 spray into both nostrils as needed.   Galcanezumab-gnlm (EMGALITY) 120 MG/ML SOAJ INJECT 240 MG INTO THE SKIN ONCE FOR 1 DOSE. LOADING DOSE   hydrOXYzine (ATARAX) 10 MG tablet TAKE 1/2 TABLET BY MOUTH EVERY DAY AT BEDTIME   metoprolol succinate (TOPROL-XL) 100 MG 24 hr tablet Take 1 tablet (100 mg total)  by mouth at bedtime. Take with or immediately following a meal.   Misc Natural Products (IBEROGAST) LIQD Take by mouth.   ondansetron (ZOFRAN) 4 MG tablet Take 1 tablet (4 mg total) by mouth every 8 (eight) hours as needed for nausea or vomiting.   Rimegepant Sulfate (NURTEC) 75 MG TBDP Take 1 tablet (75 mg total) by mouth daily as needed.   rosuvastatin (CRESTOR) 10 MG tablet Take 1 tablet (10 mg total) by mouth daily.   scopolamine (TRANSDERM-SCOP) 1 MG/3DAYS Place 1 patch (1.5 mg total) onto the skin every 3 (three) days.   triamcinolone cream (KENALOG) 0.5 % APPLY TOPICALLY 2 (TWO) TIMES DAILY AS NEEDED (ECZEMA, ITCHING).   []  sacubitril-valsartan (ENTRESTO) 24-26 MG Take 1 tablet by mouth 2 (two) times daily.   []  Semaglutide-Weight Management (WEGOVY) 2.4 MG/0.75ML SOAJ INJECT 2.4 MG INTO THE SKIN ONCE A WEEK.    Studies Reviewed: .        14-Day Zio Patch MONITOR (January 2025): Predominant rhythm is sinus rhythm with a rate of 53 to 147 bpm, average 86 BPM.  Rare PACs with some couplets, rare PVCs.  1 4 beat run of PVCs ("nonsustained V. tach ").  No sustained arrhythmias.  Benign study.  Coronary CTA: Total CAC 0.  TBB 158 mm cube.  Mild, nonobstructive soft plaque in the LAD.  Lab Results  Component Value Date   CHOL 207 (H) 09/30/2023   HDL 35 (L) 09/30/2023   LDLCALC 138 (H) 09/30/2023   TRIG 186 (H) 09/30/2023   CHOLHDL 5.9 (H) 09/30/2023   Lab Results  Component Value Date   NA 140 09/30/2023   K 4.0 09/30/2023   CREATININE 1.06 (H) 09/30/2023   EGFR 64 09/30/2023   GLUCOSE 102 (H) 09/30/2023   Lab Results  Component Value Date   HGBA1C 5.9 (A) 04/27/2023    Risk Assessment/Calculations:         Physical Exam:   VS:  BP 134/88   Pulse 81   Ht 5\' 7"  (1.702 Summer Brewer)   Wt 267 lb 9.6 oz (121.4 kg)   LMP 08/10/2022 (Approximate)   SpO2 99%   BMI 41.91 kg/Summer Brewer    Wt Readings from Last 3 Encounters:  10/30/23 267 lb 9.6 oz (121.4 kg)  10/13/23 268 lb (121.6 kg)   09/21/23 269 lb 6.4 oz (122.2 kg)    GEN: Well nourished, well groomed in no acute distress; morbidly obese but otherwise healthy-appearing. NECK: No JVD; No carotid bruits CARDIAC: RRR; distant, but normal S1, S2; S4 gallop but otherwise no rubs or murmurs.   RESPIRATORY:  Clear to auscultation without rales, wheezing or rhonchi ; nonlabored, good air movement. ABDOMEN: Soft, non-tender, non-distended EXTREMITIES:  No edema; No deformity     ASSESSMENT AND PLAN: .    Problem List Items Addressed This Visit       Cardiology Problems   Chronic combined systolic and diastolic heart failure (HCC); mildly reduced EF - Primary (Chronic)  Likely hypertensive heart disease with no evidence of CAD on Coronary CTA. Relatively asymptomatic now without any significant CHF symptoms.  Euvolemic on exam. Currently on Toprol-XL 100 mg daily, Entresto 24 to 26 mg twice daily, 25 mg of spironolactone along with 10 mg Jardiance as well as AOZHYQ. Symptoms relatively controlled. Diastolic blood pressure still slightly elevated. -Increase Entresto to next dose level (49/51mg ).      Relevant Medications   sacubitril-valsartan (ENTRESTO) 49-51 MG   Hyperlipidemia (Chronic)   Recently started on Rosuvastatin 10mg  by primary care provider based on recent labs. Coronary calcium score is low, indicating minimal plaque. -Continue Rosuvastatin 10mg . Recheck lipid panel and consider dose adjustment as needed.      Relevant Medications   sacubitril-valsartan (ENTRESTO) 49-51 MG   Hypertensive cardiomyopathy, with heart failure (HCC) (Chronic)   Hypertensive Heart Disease -> trivial CAD noted on coronary CTA. Continue to titrate antihypertensives and work on weight loss.      Relevant Medications   sacubitril-valsartan (ENTRESTO) 49-51 MG     Other   Morbid obesity with BMI of 45.0-49.9, adult (HCC) (Chronic)   Patient has lost significant weight and is tolerating Wegovy well, despite some side  effects of acid reflux and nausea. -Continue [id number]. Consider strategies to manage side effects, such as elevating head of bed for reflux.      Obstructive sleep apnea (Chronic)   Patient not yet on CPAP therapy. Importance of CPAP for protecting right ventricle and overall blood pressure control discussed. -Encourage patient to pursue CPAP therapy with primary care provider or neurologist.      Prediabetes    Follow-Up: Return in about 6 months (around 04/28/2024) for Alternate 6 month follow-up with APP & MD.okay for initial visit to be either with myself or Rise Paganini, [place].    Signed, Marykay Lex, MD, MS Bryan Lemma, Summer Brewer.D., Summer Brewer.S. Interventional Cardiologist  [facility]  Pager # [phone] Phone # [phone] [address] [place], [place] [address]

## 2023-10-30 NOTE — Patient Instructions (Addendum)
 Medication Instructions:    Start taking Entresto 49/51 mg  twice a day    Stop taking Entresto  24/26 mg   *If you need a refill on your cardiac medications before your next appointment, please call your pharmacy*   Lab Work: Not needed    Testing/Procedures:  Not needed  Follow-Up: At [facility], you and your health needs are our priority.  As part of our continuing mission to provide you with exceptional heart care, we have created designated Provider Care Teams.  These Care Teams include your primary Cardiologist (physician) and Advanced Practice Providers (APPs -  Physician Assistants and Nurse Practitioners) who all work together to provide you with the care you need, when you need it.     Your next appointment:   6 month(s)  The format for your next appointment:   In Person  Provider:   Rise Paganini NP    Then, Bryan Lemma, MD will plan to see you again in 12 month(s).

## 2023-10-31 ENCOUNTER — Encounter: Payer: Self-pay | Admitting: Cardiology

## 2023-10-31 ENCOUNTER — Other Ambulatory Visit: Payer: Self-pay | Admitting: Family Medicine

## 2023-11-02 MED ORDER — WEGOVY 2.4 MG/0.75ML ~~LOC~~ SOAJ: 2.4000 mg | SUBCUTANEOUS | 0 refills | Status: DC

## 2023-11-02 NOTE — Telephone Encounter (Signed)
 Called and left a voicemail message for patient to call back triage and let us know which pharmacy to send wegovy refill to.

## 2023-11-03 MED ORDER — WEGOVY 2.4 MG/0.75ML ~~LOC~~ SOAJ: 2.4000 mg | SUBCUTANEOUS | 11 refills | Status: DC

## 2023-11-04 ENCOUNTER — Encounter: Payer: Self-pay | Admitting: Cardiology

## 2023-11-04 DIAGNOSIS — R7303 Prediabetes: Secondary | ICD-10-CM | POA: Insufficient documentation

## 2023-11-04 NOTE — Assessment & Plan Note (Signed)
 Likely hypertensive heart disease with no evidence of CAD on Coronary CTA. Relatively asymptomatic now without any significant CHF symptoms.  Euvolemic on exam. Currently on Toprol-XL 100 mg daily, Entresto 24 to 26 mg twice daily, 25 mg of spironolactone along with 10 mg Jardiance as well as ZOXWRU. Symptoms relatively controlled. Diastolic blood pressure still slightly elevated. -Increase Entresto to next dose level (49/51mg ).

## 2023-11-04 NOTE — Assessment & Plan Note (Signed)
 Recently started on Rosuvastatin 10mg  by primary care provider based on recent labs. Coronary calcium score is low, indicating minimal plaque. -Continue Rosuvastatin 10mg . Recheck lipid panel and consider dose adjustment as needed.

## 2023-11-04 NOTE — Assessment & Plan Note (Signed)
 Patient has lost significant weight and is tolerating Wegovy well, despite some side effects of acid reflux and nausea. -Continue [id number]. Consider strategies to manage side effects, such as elevating head of bed for reflux.

## 2023-11-04 NOTE — Assessment & Plan Note (Signed)
 Hypertensive Heart Disease -> trivial CAD noted on coronary CTA. Continue to titrate antihypertensives and work on weight loss.

## 2023-11-04 NOTE — Assessment & Plan Note (Signed)
 Patient not yet on CPAP therapy. Importance of CPAP for protecting right ventricle and overall blood pressure control discussed. -Encourage patient to pursue CPAP therapy with primary care provider or neurologist.

## 2023-11-24 ENCOUNTER — Other Ambulatory Visit (HOSPITAL_COMMUNITY): Payer: Self-pay

## 2023-12-01 ENCOUNTER — Encounter: Payer: Self-pay | Admitting: Cardiology

## 2023-12-14 ENCOUNTER — Encounter: Payer: Self-pay | Admitting: Family Medicine

## 2023-12-14 ENCOUNTER — Ambulatory Visit (INDEPENDENT_AMBULATORY_CARE_PROVIDER_SITE_OTHER): Admitting: Family Medicine

## 2023-12-14 VITALS — BP 136/88 | HR 83 | Ht 67.0 in | Wt 268.0 lb

## 2023-12-14 DIAGNOSIS — Z1231 Encounter for screening mammogram for malignant neoplasm of breast: Secondary | ICD-10-CM | POA: Diagnosis not present

## 2023-12-14 DIAGNOSIS — R350 Frequency of micturition: Secondary | ICD-10-CM | POA: Diagnosis not present

## 2023-12-14 MED ORDER — CEFADROXIL 500 MG PO CAPS: 500.0000 mg | ORAL_CAPSULE | Freq: Two times a day (BID) | ORAL | 0 refills | Status: DC

## 2023-12-14 MED ORDER — ALBUTEROL SULFATE HFA 108 (90 BASE) MCG/ACT IN AERS: 2.0000 | INHALATION_SPRAY | RESPIRATORY_TRACT | 3 refills | Status: AC | PRN

## 2023-12-14 NOTE — Progress Notes (Signed)
    SUBJECTIVE:   CHIEF COMPLAINT: urinary urgency  HPI:   Summer Brewer is a 53 y.o.  with history notable for hysterectomy, prior frequent UTI, and heart failure with reduced EF (nonischemic)  presenting for urinary symptoms  Reports several days of frequency, urgency, suprapubic pain. No dysuria or hematuria. No nausea, vomiting, fevers. Using Azo with relief. No vaginal discharge or new partners. Marland Kitchen   PERTINENT  PMH / PSH/Family/Social History : E. Coli UTI several years ago, pansensitive   OBJECTIVE:   BP 136/88   Pulse 83   Ht 5\' 7"  (1.702 m)   Wt 268 lb (121.6 kg)   LMP 08/10/2022 (Approximate)   SpO2 98%   BMI 41.97 kg/m   Today's weight:  Last Weight  Most recent update: 12/14/2023 11:04 AM    Weight  121.6 kg (268 lb)            Review of prior weights: [employer]   12/14/23 1104  Weight: 268 lb (121.6 kg)    RRR Lungs clear + mild suprapubic tenderness  No CVA tenderness   ASSESSMENT/PLAN:   Assessment & Plan Urinary frequency UA not run due to Azo Culture sent Empiric RX with cefadroxil   Screening mammogram for breast cancer Mammogram ordered   Terisa Starr, MD  Family Medicine Teaching Service  [facility]

## 2023-12-14 NOTE — Patient Instructions (Addendum)
 It was wonderful to see you today.  Please bring ALL of your medications with you to every visit.   Today we talked about:  I sent in an antibiotic to take twice a day  Return if your symptoms do not improve  I will message you with results   I recommend you undergo a mammogram.   You can call to schedule an appointment by calling [phone].   Directions [address] [place], [place] [address]  Please let me know if you have questions. I will send you a letter or call you with results.    Please follow up in 3 months   Thank you for choosing [facility].   Please call [phone] with any questions about today's appointment.  Please be sure to schedule follow up at the front  desk before you leave today.   Terisa Starr, MD  Family Medicine

## 2023-12-16 ENCOUNTER — Encounter: Payer: Self-pay | Admitting: Family Medicine

## 2023-12-16 LAB — URINE CULTURE: Organism ID, Bacteria: NO GROWTH

## 2023-12-21 ENCOUNTER — Encounter: Payer: Self-pay | Admitting: Cardiology

## 2023-12-22 ENCOUNTER — Telehealth: Payer: Self-pay | Admitting: Pharmacy Technician

## 2023-12-22 ENCOUNTER — Other Ambulatory Visit (HOSPITAL_COMMUNITY): Payer: Self-pay

## 2023-12-22 NOTE — Telephone Encounter (Signed)
 Pharmacy Patient Advocate Encounter   Received notification from Physician's Office that prior authorization for [facility] is required/requested.   Insurance verification completed.   The patient is insured through [employer] .   Per test claim: PA not required :Not Plan covered/ member pays 100% cash rate

## 2023-12-27 DIAGNOSIS — R002 Palpitations: Secondary | ICD-10-CM | POA: Diagnosis not present

## 2024-01-13 ENCOUNTER — Encounter: Payer: Self-pay | Admitting: Family Medicine

## 2024-01-13 ENCOUNTER — Ambulatory Visit: Admitting: Student

## 2024-01-13 VITALS — BP 170/101 | HR 82 | Ht 67.0 in | Wt 267.6 lb

## 2024-01-13 DIAGNOSIS — N949 Unspecified condition associated with female genital organs and menstrual cycle: Secondary | ICD-10-CM

## 2024-01-13 DIAGNOSIS — I10 Essential (primary) hypertension: Secondary | ICD-10-CM

## 2024-01-13 DIAGNOSIS — N393 Stress incontinence (female) (male): Secondary | ICD-10-CM

## 2024-01-13 LAB — POCT WET PREP (WET MOUNT)
Clue Cells Wet Prep Whiff POC: NEGATIVE
Trichomonas Wet Prep HPF POC: ABSENT

## 2024-01-13 NOTE — Patient Instructions (Signed)
 It was great to see you! Thank you for allowing me to participate in your care!   I recommend that you always bring your medications to each appointment as this makes it easy to ensure we are on the correct medications and helps us  not miss when refills are needed.  Our plans for today:  - Please DO NOT take AZO pills with Phenazopyridine  - You can take tylenol  and ibuprofen  for discomfort - We have sent a GYN referral, but you can also call to schedule your appointment since you are established - We are checking some labs today, I will call you if they are abnormal will send you a MyChart message or a letter if they are normal.  If you do not hear about your labs in the next 2 weeks please let us  know. - Please follow-up in 2 weeks for blood pressure check  Take care and seek immediate care sooner if you develop any concerns. Please remember to show up 15 minutes before your scheduled appointment time!  Lavada Porteous, DO [facility]

## 2024-01-13 NOTE — Assessment & Plan Note (Signed)
 BP recheck still elevated. Asymptomatic.  - Check BMP in setting of uncontrolled BP, CHF, and taking nephrotoxic Phenazopyridine  for >1 week over the past couple of months. - Follow-up in 2 weeks to recheck blood pressure

## 2024-01-13 NOTE — Progress Notes (Signed)
    SUBJECTIVE:   CHIEF COMPLAINT / HPI:   Vaginal Discomfort Patient reports pressure in vagina, urethra, and lower abdomen. No discharge, bleeding, nor dysuria. Taking AZO 1 pill daily for 7 days. Hysterectomy with Salpingectomy 09/30/2022. Sexually active, not using condoms, partner asymptomatic. Previously seen in April and urine culture negative, but could not obtain UA d/t AZO- again cannot obtain today.  Having to wear liners in underwear d/t stress incontinence symptoms. Able to make it to the rest room.  OBJECTIVE:   BP (!) 160/102   Pulse 82   Ht 5\' 7"  (1.702 m)   Wt 267 lb 9.6 oz (121.4 kg)   LMP 08/10/2022 (Approximate)   SpO2 100%   BMI 41.91 kg/m    General: NAD, pleasant Cardio: RRR, no MRG. Cap Refill <2s. Respiratory: CTAB, normal wob on RA GI: Abdomen is soft, not tender, not distended. BS present Skin: Warm and dry  GU Exam performed by Dr. Drue Gerald, chaperone Alexis CMA. Please see attending attestation.  ASSESSMENT/PLAN:   Assessment & Plan Vaginal discomfort Differential: Pelvic floor dysfunction. Low concern for STI, UTI, but will check labs. -GC/Chlamydia, [place], RPR, HIV -Counseled to discontinue use of Phenazopyridine  Stress incontinence Symptoms of stress incontinence w/ exam c/f prolapse. -Referral to GYN, patient may also call Primary hypertension BP recheck still elevated. Asymptomatic.  - Check BMP in setting of uncontrolled BP, CHF, and taking nephrotoxic Phenazopyridine  for >1 week over the past couple of months. - Follow-up in 2 weeks to recheck blood pressure  Lavada Porteous, DO [facility]

## 2024-01-14 ENCOUNTER — Encounter: Payer: Self-pay | Admitting: Student

## 2024-01-14 LAB — CERVICOVAGINAL ANCILLARY ONLY
Chlamydia: NEGATIVE
Comment: NEGATIVE
Comment: NEGATIVE
Comment: NORMAL
Neisseria Gonorrhea: NEGATIVE
Trichomonas: NEGATIVE

## 2024-01-15 ENCOUNTER — Telehealth: Payer: Self-pay | Admitting: Student

## 2024-01-15 DIAGNOSIS — R102 Pelvic and perineal pain: Secondary | ICD-10-CM

## 2024-01-15 LAB — URINE CULTURE

## 2024-01-15 LAB — BASIC METABOLIC PANEL WITH GFR
BUN/Creatinine Ratio: 15 (ref 9–23)
BUN: 15 mg/dL (ref 6–24)
CO2: 23 mmol/L (ref 20–29)
Calcium: 9.4 mg/dL (ref 8.7–10.2)
Chloride: 99 mmol/L (ref 96–106)
Creatinine, Ser: 1.01 mg/dL — ABNORMAL HIGH (ref 0.57–1.00)
Glucose: 75 mg/dL (ref 70–99)
Potassium: 3.8 mmol/L (ref 3.5–5.2)
Sodium: 140 mmol/L (ref 134–144)
eGFR: 68 mL/min/{1.73_m2} (ref >59)

## 2024-01-15 LAB — HIV ANTIBODY (ROUTINE TESTING W REFLEX): HIV Screen 4th Generation wRfx: NONREACTIVE

## 2024-01-15 LAB — RPR: RPR Ser Ql: NONREACTIVE

## 2024-01-15 MED ORDER — IBUPROFEN 800 MG PO TABS
800.0000 mg | ORAL_TABLET | Freq: Three times a day (TID) | ORAL | 0 refills | Status: AC | PRN
Start: 2024-01-15 — End: ?

## 2024-01-15 NOTE — Telephone Encounter (Signed)
 Called patient, confirmed identity.  Discussed negative lab workup.  Called patient to discuss her concern for pain uncontrolled with OTC medications.   She describes her pain as:  "Toothe ache" "throbbing pain" that comes before and after urination. On going for a week. Pain in pelvic area, and vaginal vault. Pain improves throughout the day, but then worsens again once she uses the bathroom.  Reports strong stream, feels like she empties her bladder, has frequency and urgency.  No vaginal discharge, odor, bleeding.  AZO pills "took the edge off," but did not resolve her symptoms.  She is currently using heating pads and Tylenol , taking 500 mg Tylenol  every 6 hours.  She had 800 mg ibuprofen  tablets from a prior issue, which helped-but she has not had these in a few days.  Again she is status post hysterectomy with salpingectomy in 09/30/2022.  Her infectious blood work was negative.  Differential remains broad and includes: Cystocele with pelvic floor dysfunction.  Discussed the possibility of retention with a possible cystocele, although patient does not believe she is retaining.  With frequency and urgency this could be overactive bladder-but hesitant to trial anticholinergic/beta 3 agonist with the degree of her pain out of concern of retention. Again, low concern for infectious pathology.  It would be less likely for her to have a GU mass with hysterectomy and salpingectomy-but very well may need imaging if pain does not improve.  Interstitial cystitis remains in the differential-however pain had only slightly improved with AZO pills.  Recommendations: - Sent for 800 mg ibuprofen  every 8 hours as needed - Recommend 1000 mg Tylenol  every 6-8 hours as needed, not to exceed 4000 mg - Recommended if pain significantly worsens, if she develops retention, fevers or other systemic symptoms she should go to the emergency room-as she may need abdominal imaging. - Follow-up with GYN on May 19

## 2024-01-25 ENCOUNTER — Telehealth: Payer: Self-pay | Admitting: Pharmacy Technician

## 2024-01-25 ENCOUNTER — Encounter: Payer: Self-pay | Admitting: Obstetrics and Gynecology

## 2024-01-25 ENCOUNTER — Ambulatory Visit: Admitting: Obstetrics and Gynecology

## 2024-01-25 ENCOUNTER — Other Ambulatory Visit (HOSPITAL_COMMUNITY): Payer: Self-pay

## 2024-01-25 VITALS — BP 139/89 | HR 76 | Ht 67.0 in | Wt 270.0 lb

## 2024-01-25 DIAGNOSIS — R32 Unspecified urinary incontinence: Secondary | ICD-10-CM | POA: Diagnosis not present

## 2024-01-25 NOTE — Telephone Encounter (Signed)
 Pharmacy Patient Advocate Encounter   Received notification from CoverMyMeds that prior authorization for wegovy  is required/requested.   Insurance verification completed.   The patient is insured through [facility] .   Per test claim: PA required; PA submitted to above mentioned insurance via CoverMyMeds Key/confirmation #/EOC [id number] Status is pending   [employer] says termed non payment

## 2024-01-25 NOTE — Progress Notes (Addendum)
 53 y.o. GYN presents for vaginal pressure, possible bladder prolapse.    Last PAP 10/07/2021  ASCUS. Last Mammogram 11/14/2021  Negative.

## 2024-01-25 NOTE — Progress Notes (Signed)
 53 yo P2 s/p vaginal hysterectomy in 2024 presenting today for the evaluation of Summer Brewer pelvic pressure and urinary incontinence. Patient describes urinary frequency since her procedure and negative testing for urinary tract infections on multiple occasions. She reports that at times she experiences urinary incontinence  Past Medical History:  Diagnosis Date   Anemia    Anxiety    Back pain    CAD (coronary artery disease)    Cardiomyopathy (HCC) 03/2023   Echo with EF 40 to 45% global hypokinesis.   CHF (congestive heart failure) (HCC)    Eczema    Fibroid    GERD (gastroesophageal reflux disease)    Hypertension    Migraines    Post-operative state 09/30/2022   Preterm labor    Umbilical hernia    Vaginal Pap smear, abnormal    Past Surgical History:  Procedure Laterality Date   CESAREAN SECTION  2000   x2 and 2002   DILITATION & CURRETTAGE/HYSTROSCOPY WITH HYDROTHERMAL ABLATION N/A 11/19/2021   Procedure: HYSTEROSCOPY WITH HYDROTHERMAL ABLATION;  Surgeon: Verlyn Goad, MD;  Location: [facility];  Service: Gynecology;  Laterality: N/A;   INSERTION OF MESH  08/09/2015   Procedure: INSERTION OF MESH;  Surgeon: Dareen Ebbing, MD;  Location: [facility] OR;  Service: General;;   NASAL POLYP EXCISION Bilateral 2016   pt states she had polyps removed on both sides   TRANSTHORACIC ECHOCARDIOGRAM  03/29/2023   EF 40-45%, global hypokinesis, grade 1 diastolic dysfunction, mildly reduced RV function, normal aortic and mitral valves, normal right atrial pressures   TUBAL LIGATION     UMBILICAL HERNIA REPAIR  08/09/2015   Procedure: UMBILICAL HERNIA REPAIR;  Surgeon: Dareen Ebbing, MD;  Location: [facility] OR;  Service: General;;   VAGINAL HYSTERECTOMY Bilateral 09/30/2022   Procedure: HYSTERECTOMY VAGINAL WITH BILATERAL SALPINGECTOMY;  Surgeon: Othelia Blinks, MD;  Location: [facility] OR;  Service: Gynecology;  Laterality: Bilateral;   Family History  Problem Relation Age of Onset    Hypertension Mother    Deep vein thrombosis Mother    Hypertension Father    Cancer - Prostate Father    Cancer Paternal Grandmother    Hypertension Other    Deep vein thrombosis Other    Leukemia Other    Colon cancer Neg Hx    Colon polyps Neg Hx    Esophageal cancer Neg Hx    Stomach cancer Neg Hx    Rectal cancer Neg Hx    Social History   Tobacco Use   Smoking status: Never    Passive exposure: Never   Smokeless tobacco: Never  Vaping Use   Vaping status: Never Used  Substance Use Topics   Alcohol use: No   Drug use: No   ROS See pertinent in HPI. All other systems reviewed and non contributory Blood pressure 139/89, pulse 76, height 5\' 7"  (1.702 m), weight 270 lb (122.5 kg), last menstrual period 08/10/2022. GENERAL: Well-developed, well-nourished female in no acute distress.  ABDOMEN: Soft, nontender, nondistended. No organomegaly. PELVIC: Normal external female genitalia. Vagina is pink and rugated. Small anterior wall prolapse.  Normal discharge. Poor muscle tone during pelvic exam. No adnexal mass or tenderness. Chaperone present during the pelvic exam EXTREMITIES: No cyanosis, clubbing, or edema, 2+ distal pulses.  A/P 53 yo with urinary incontinence and anterior wall prolpase - Patient referred to urogynecology for further evaluation - Patient referred for screening mammogram by PCP - Patient current on colonoscopy

## 2024-01-26 ENCOUNTER — Encounter: Payer: Self-pay | Admitting: Obstetrics and Gynecology

## 2024-01-26 ENCOUNTER — Telehealth: Payer: Self-pay | Admitting: Pharmacy Technician

## 2024-01-26 ENCOUNTER — Other Ambulatory Visit: Payer: Self-pay | Admitting: Family Medicine

## 2024-01-26 NOTE — Telephone Encounter (Signed)
   I called the patient and made her aware. She will call medicaid and let them know she does not have any other insurance and then let us  know.

## 2024-01-26 NOTE — Telephone Encounter (Signed)
   Patient was seen at another office yesterday. Faxed appeal to [employer] 01/26/24. Patient aware

## 2024-01-26 NOTE — Telephone Encounter (Signed)
 Pharmacy Patient Advocate Encounter   Received notification from CoverMyMeds that prior authorization for wegovy  is required/requested.   Insurance verification completed.   The patient is insured through [facility] .   Per test claim: PA required; PA submitted to above mentioned insurance via CoverMyMeds Key/confirmation #/EOC [id number] Status is pending

## 2024-01-26 NOTE — Telephone Encounter (Signed)
 Patient said she called [employer] and advised them that she only has [employer]. They said we can try to submit  for a prior authorization after 1pm today.

## 2024-01-27 ENCOUNTER — Encounter: Payer: Self-pay | Admitting: Family Medicine

## 2024-01-27 ENCOUNTER — Other Ambulatory Visit (HOSPITAL_COMMUNITY): Payer: Self-pay

## 2024-01-27 DIAGNOSIS — R102 Pelvic and perineal pain: Secondary | ICD-10-CM

## 2024-01-27 DIAGNOSIS — N8110 Cystocele, unspecified: Secondary | ICD-10-CM

## 2024-01-27 DIAGNOSIS — Z90710 Acquired absence of both cervix and uterus: Secondary | ICD-10-CM

## 2024-01-27 NOTE — Telephone Encounter (Signed)
 More info under Key: [id number]

## 2024-01-27 NOTE — Telephone Encounter (Signed)
 Pharmacy Patient Advocate Encounter  Received notification from [facility] that Prior Authorization for wegovy  has been APPROVED from 01/27/24 to 01/26/25. Spoke to pharmacy to process.Copay is $4.00.      I called the patient to make her aware

## 2024-02-02 ENCOUNTER — Encounter: Payer: Self-pay | Admitting: Cardiology

## 2024-02-12 ENCOUNTER — Other Ambulatory Visit (HOSPITAL_COMMUNITY): Payer: Self-pay

## 2024-02-17 ENCOUNTER — Other Ambulatory Visit: Payer: Self-pay | Admitting: Neurology

## 2024-02-17 ENCOUNTER — Encounter: Payer: Self-pay | Admitting: Neurology

## 2024-02-17 MED ORDER — NURTEC 75 MG PO TBDP: 1.0000 | ORAL_TABLET | Freq: Every day | ORAL | 5 refills | Status: DC | PRN

## 2024-02-18 ENCOUNTER — Ambulatory Visit

## 2024-02-18 NOTE — Progress Notes (Signed)
   SUBJECTIVE:   CHIEF COMPLAINT / HPI:   Leg pain, numbness - 1 week duration - L leg on side of calf.  - feels like a throbbing, aching.  - taken naproxen , nyquil pain reliever, exercise tape, ice without relief - no new activities or trauma - a little puffy in L ankle, not bilateral - pain wakes up from sleep - walking doesn't seem to bother - no chest pain, SOB - +FH of clots  OSA - needs CPAP titration study. Has number to call and schedule  OBESITY - Meds: wegovy  - Previously on none - Complications of obesity: HFrEF, HTN, HLD, OSA, prediabetes  - Peak weight: 292lb - Current weight: 258lb - has lost 12lb since last visit last month   OBJECTIVE:   BP 108/80   Pulse 78   Wt 258 lb 9.6 oz (117.3 kg)   LMP 08/10/2022 (Approximate)   SpO2 99%   BMI 40.50 kg/m   Gen: well appearing, in NAD Card: RRR Lungs: CTAB Ext: WWP, slight nonpitting edema to L ankle and distal shin. TTP over lateral and posterior calf. +Homans. No cords or erythema or overlying rash appreciated.    ASSESSMENT/PLAN:   Obstructive sleep apnea Recommended to call for CPAP study. Will place new referral if needed.   Hypertension Low normal today, no changes.   Leg pain Some concern for DVT, will get STAT doppler US .   Morbid obesity with BMI of 45.0-49.9, adult (HCC) Doing well on wegovy , no changes. Continue diet and exercise efforts.      Kandis Ormond, DO

## 2024-02-18 NOTE — Progress Notes (Deleted)
  SUBJECTIVE:   CHIEF COMPLAINT / HPI:   ***  PERTINENT  PMH / PSH: HTN, HLD, OSA,HFmrEF,, GERD, migraines, prediabetes  OBJECTIVE:  LMP 08/10/2022 (Approximate)  ***  ASSESSMENT/PLAN:   Assessment & Plan  No follow-ups on file. Veronia Goon, DO 02/18/2024, 8:39 AM PGY-***, [facility] {    This will disappear when note is signed, click to select method of visit    :1}

## 2024-02-19 ENCOUNTER — Ambulatory Visit: Payer: Self-pay | Admitting: Family Medicine

## 2024-02-19 ENCOUNTER — Encounter: Payer: Self-pay | Admitting: Family Medicine

## 2024-02-19 ENCOUNTER — Ambulatory Visit (HOSPITAL_COMMUNITY)
Admission: RE | Admit: 2024-02-19 | Discharge: 2024-02-19 | Disposition: A | Discharge status: Home / Self Care | Source: Ambulatory Visit | Attending: Vascular Surgery

## 2024-02-19 ENCOUNTER — Ambulatory Visit (INDEPENDENT_AMBULATORY_CARE_PROVIDER_SITE_OTHER): Admitting: Family Medicine

## 2024-02-19 VITALS — BP 108/80 | HR 78 | Wt 258.6 lb

## 2024-02-19 DIAGNOSIS — M79605 Pain in left leg: Secondary | ICD-10-CM

## 2024-02-19 DIAGNOSIS — Z6841 Body Mass Index (BMI) 40.0 and over, adult: Secondary | ICD-10-CM | POA: Diagnosis not present

## 2024-02-19 DIAGNOSIS — G4733 Obstructive sleep apnea (adult) (pediatric): Secondary | ICD-10-CM | POA: Diagnosis not present

## 2024-02-19 DIAGNOSIS — I10 Essential (primary) hypertension: Secondary | ICD-10-CM | POA: Diagnosis not present

## 2024-02-19 MED ORDER — GABAPENTIN 100 MG PO CAPS: 100.0000 mg | ORAL_CAPSULE | Freq: Three times a day (TID) | ORAL | 0 refills | Status: DC | PRN

## 2024-02-19 NOTE — Assessment & Plan Note (Signed)
 Low normal today, no changes.

## 2024-02-19 NOTE — Assessment & Plan Note (Signed)
 Some concern for DVT, will get STAT doppler US .

## 2024-02-19 NOTE — Assessment & Plan Note (Signed)
 Recommended to call for CPAP study. Will place new referral if needed.

## 2024-02-19 NOTE — Assessment & Plan Note (Signed)
 Doing well on wegovy , no changes. Continue diet and exercise efforts.

## 2024-02-19 NOTE — Patient Instructions (Addendum)
 It was great to see you!  Our plans for today:  - We are getting an ultrasound of your leg. We will call you with these results if positive for blood clot, otherwise we will release these results to your MyChart.  - Take the gabapentin at night to make sure you tolerate, then you can take up to 3 times daily as needed for pain.   - Let me know if you need a new referral for your CPAP study.   Take care and seek immediate care sooner if you develop any concerns.   Dr. Rosali Augello

## 2024-02-20 ENCOUNTER — Encounter: Payer: Self-pay | Admitting: Family Medicine

## 2024-02-20 DIAGNOSIS — R102 Pelvic and perineal pain: Secondary | ICD-10-CM

## 2024-02-22 MED ORDER — NAPROXEN 500 MG PO TABS: 500.0000 mg | ORAL_TABLET | Freq: Two times a day (BID) | ORAL | 0 refills | Status: DC

## 2024-02-23 ENCOUNTER — Telehealth: Payer: Self-pay

## 2024-02-23 ENCOUNTER — Other Ambulatory Visit (HOSPITAL_COMMUNITY): Payer: Self-pay

## 2024-02-23 NOTE — Telephone Encounter (Signed)
 Pharmacy Patient Advocate Encounter   Received notification from Patient Advice Request messages that prior authorization for Nurtec 75MG  dispersible tablets is required/requested.   Insurance verification completed.   The patient is insured through [facility] .   Per test claim: PA required; PA submitted to above mentioned insurance via CoverMyMeds Key/confirmation #/EOC [id number] Status is pending

## 2024-02-23 NOTE — Telephone Encounter (Signed)
 PA request has been Submitted. New Encounter has been or will be created for follow up. For additional info see Pharmacy Prior Auth telephone encounter from 02-23-2024.

## 2024-02-24 NOTE — Telephone Encounter (Signed)
 Pharmacy Patient Advocate Encounter  Received notification from [facility] that Prior Authorization for Nurtec 75MG  dispersible tablets has been APPROVED from 02-23-2024 to 02-22-2025   PA #/Case ID/Reference #: [id number]

## 2024-02-25 ENCOUNTER — Encounter: Payer: Self-pay | Admitting: Family Medicine

## 2024-02-25 ENCOUNTER — Telehealth: Payer: Self-pay

## 2024-02-25 ENCOUNTER — Ambulatory Visit
Admission: RE | Admit: 2024-02-25 | Discharge: 2024-02-25 | Disposition: A | Discharge status: Home / Self Care | Source: Ambulatory Visit | Attending: Family Medicine | Admitting: Family Medicine

## 2024-02-25 DIAGNOSIS — Z1231 Encounter for screening mammogram for malignant neoplasm of breast: Secondary | ICD-10-CM | POA: Diagnosis not present

## 2024-02-25 NOTE — Telephone Encounter (Signed)
 Per Dr.Jaffe, Patient to start Vyepti 300 mg every 90 days

## 2024-02-26 ENCOUNTER — Other Ambulatory Visit (HOSPITAL_COMMUNITY): Payer: Self-pay | Admitting: Neurology

## 2024-02-26 ENCOUNTER — Telehealth: Payer: Self-pay | Admitting: Pharmacy Technician

## 2024-02-26 DIAGNOSIS — G43119 Migraine with aura, intractable, without status migrainosus: Secondary | ICD-10-CM

## 2024-02-26 NOTE — Telephone Encounter (Signed)
 Dr. Festus Hubert, Patient will be scheduled as soon as possible.  Auth Submission: NO AUTH NEEDED Site of care: Site of care: [facility] Payer: [facility] community Medication & CPT/J Code(s) submitted: Vyepti (Eptinezumab) [id number] Diagnosis Code:  Route of submission (phone, fax, portal):  Phone # Fax # Auth type: Buy/Bill HB Units/visits requested: 300mg  q17months Reference number: [id number] Approval from: 02/26/24 to 09/07/24

## 2024-03-02 ENCOUNTER — Telehealth: Payer: Self-pay | Admitting: Pharmacy Technician

## 2024-03-02 ENCOUNTER — Other Ambulatory Visit (HOSPITAL_COMMUNITY): Payer: Self-pay | Admitting: Neurology

## 2024-03-02 DIAGNOSIS — G43119 Migraine with aura, intractable, without status migrainosus: Secondary | ICD-10-CM

## 2024-03-02 NOTE — Telephone Encounter (Signed)
 Auth Submission: NO AUTH NEEDED Site of care: Site of care: [facility] Payer: medicaid healthy blue Medication & CPT/J Code(s) submitted: Vyepti (Eptinezumab) [id number] Diagnosis Code:  Route of submission (phone, fax, portal):  Phone # Fax # Auth type: Buy/Bill HB Units/visits requested: 300mg  q38months Reference number:  Approval from: 03/02/24 to 09/07/24

## 2024-03-03 NOTE — Progress Notes (Signed)
   SUBJECTIVE:   CHIEF COMPLAINT / HPI:   L LEG PAIN - seen previously 6/13 for throbbing sensation on lateral calf with associated swelling. No trauma. No systemic symptoms. Obtained LE doppler US , negative for DVT, Baker's cyst. - taken naproxen , nyquil pain reliever, exercise tape, ice without relief  - tried increase in gabapentin  no better. - 3 weeks, constant - no better with movement  Anxiety - Medications: hydroxyzine  at bedtime prn  - Taking: hydroxyzine  not working.  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 Has significant caregiver stress.  Eczema - needs refill of triamcinolone . Has upcoming appt with Derm in August.   OBJECTIVE:   BP (!) 152/98   Pulse 82   Wt 255 lb 12.8 oz (116 kg)   LMP 08/10/2022 (Approximate)   SpO2 100%   BMI 40.06 kg/m   Gen: well appearing, in NAD Card: RRR Lungs: CTAB Ext: WWP. L leg with exquisite tenderness to light touch over anteriolateral musculature, very minimal swelling as compared to contralateral side. No overlying skin changes. Intact pulses.  Psych: pleasant, appropriate affect.      03/04/2024   10:59 AM 02/19/2024    9:26 AM 01/13/2024    8:33 AM  Depression screen PHQ 2/9  Decreased Interest 1 0 0  Down, Depressed, Hopeless 1 0 0  PHQ - 2 Score 2 0 0  Altered sleeping 3 0 0  Tired, decreased energy 3 0 0  Change in appetite 3 0 0  Feeling bad or failure about yourself  1 0 0  Trouble concentrating 3 0 0  Moving slowly or fidgety/restless 1 0 0  Suicidal thoughts 0 0 0  PHQ-9 Score 16 0 0  Difficult doing work/chores  Not difficult at all Not difficult at all     ASSESSMENT/PLAN:   Leg pain Unclear etiology. Previous LE doppler US  negative for DVT. Question muscular  component given tenderness directly over anteriolateral musculature (extensor digitorum longus vs tibialis anterior) though no trauma or new activities and character of pain suggestive of nerve irritation, refractory to gabapentin , naproxen . Will refer to Sports Med for further evaluation. Consider Neuro eval if unrevealing.  Anxiety Uncontrolled. Will start sertraline  50mg , increase to 75mg  if tolerating after 2 weeks. Xanax given until able to gain better control, reminded only short course. Counseling resources provided. If no improvement at follow up, consider Psych referral given multiple failed SSRI/SNRI. F/u 1 month.  Eczema Refilled steroid cream. Await derm appt.     [name], DO

## 2024-03-04 ENCOUNTER — Encounter: Payer: Self-pay | Admitting: Family Medicine

## 2024-03-04 ENCOUNTER — Ambulatory Visit (INDEPENDENT_AMBULATORY_CARE_PROVIDER_SITE_OTHER): Admitting: Family Medicine

## 2024-03-04 VITALS — BP 152/98 | HR 82 | Wt 255.8 lb

## 2024-03-04 DIAGNOSIS — F419 Anxiety disorder, unspecified: Secondary | ICD-10-CM

## 2024-03-04 DIAGNOSIS — L309 Dermatitis, unspecified: Secondary | ICD-10-CM | POA: Diagnosis not present

## 2024-03-04 DIAGNOSIS — M79605 Pain in left leg: Secondary | ICD-10-CM

## 2024-03-04 MED ORDER — ALPRAZOLAM 0.25 MG PO TABS: 0.2500 mg | ORAL_TABLET | Freq: Two times a day (BID) | ORAL | 0 refills | Status: DC | PRN

## 2024-03-04 MED ORDER — SERTRALINE HCL 50 MG PO TABS: 50.0000 mg | ORAL_TABLET | Freq: Every day | ORAL | 0 refills | Status: DC

## 2024-03-04 MED ORDER — TRIAMCINOLONE ACETONIDE 0.5 % EX CREA: TOPICAL_CREAM | Freq: Two times a day (BID) | CUTANEOUS | 2 refills | Status: AC | PRN

## 2024-03-04 MED ORDER — SERTRALINE HCL 25 MG PO TABS: 25.0000 mg | ORAL_TABLET | Freq: Every day | ORAL | 0 refills | Status: DC

## 2024-03-04 NOTE — Assessment & Plan Note (Addendum)
 Uncontrolled. Will start sertraline  50mg , increase to 75mg  if tolerating after 2 weeks. Xanax given until able to gain better control, reminded only short course. Counseling resources provided. If no improvement at follow up, consider Psych referral given multiple failed SSRI/SNRI. F/u 1 month.

## 2024-03-04 NOTE — Patient Instructions (Addendum)
 It was great to see you!  Our plans for today:  - Take the sertraline . Start with 50mg . If you are tolerating after 2 weeks, increase to 75mg . - We are referring you to Sports Medicine for your leg. Let us  know if you don't hear about an appointment in the next few weeks.  - I sent in refills of your steroid cream for your eczema.  - come back in 1 month.  Take care and seek immediate care sooner if you develop any concerns.   Dr. Jamiah Homeyer    Therapy and Counseling Resources Most providers on this list will take Medicaid. Patients with commercial insurance or Medicare should contact their insurance company to get a list of in network providers.  [facility] (takes children) Location 1: [address], [address] [place], [place] [address] Location 2: [address] [place], [place] [address] [phone]   [facility] (spanish speaking therapist available)(habla espanol)(take medicare and medicaid)  [address], [place], [place] [address], [place]  [email or web address] [phone]  [facility]iatry and Counseling [address] [place], [place] [address] [phone]  [facility]   [address], Suite [place], [place] [address]      [phone]  [facility] (spanish available) [address]  [place], [place] [address] [phone]  [facility] (take [facility] and medicare) [address], [address]  [place], [place] [address]       [phone]     [facility] (virtual only) [phone]  [facility] [address] [place], [place] [phone]  [facility]:  [address] [place] [phone]  [facility]:  [address] [place] [phone]  [facility] [place] (under & uninsured) [address]   [place] [phone]    [email or web address]    [facility] [address]  [place]    [phone]  [facility] -[address]     Phone:  [phone]     [facility]-  [address]  [phone]   [facility] [address]      [place], [place] [phone]  [facility]: [address] [place], [place], [place]  [phone]   [facility] (Spanish therapist) [email or web address]  [address] [place]  [address]   [place] [address]    [phone]    [facility]   [address],  [place], [place]  [phone]   [facility]  [address] [place]  [phone]  [facility]  [address] [place], [place]        [phone]  Open Access/Walk In Clinic under & uninsured  [facility]  [address] [place], [place] [phone] Crisis [phone]  [facility] [address],  (Spanish)   [address] , [place]: [phone]) 8:30 - 12; 1 - 2:30  [facility] [employer],  [address] [place], [place]    [phone]):8:30 - 12; 2 - 3PM  [facility] [employer],  [address],  [place]; [phone]):   Mon - Fri 8 AM - 5 PM  [facility] [address] [place]  MWF 12:30 to 3:00 or call to schedule an appointment  [phone]  Specific Provider options Psychology Today  [email or web address]/us  click  on find a therapist  enter your zip code left side and select or tailor a therapist for your specific need.   [facility] Provider Directory [email or web address]  (Medicaid)   Follow all drop down to find a provider  Social Support program [facility] [place] [phone] or PhotoSolver.pl [address], [place], [place] Recovery support and educational   24- Hour Availability:   [facility]  [address]  [place], [place] [phone] Crisis [phone]  [facility] [phone]  [place] [facility]  [phone]   [facility] [place]  [phone] (after hours)  Therapeutic Alternative/Mobile Crisis   [phone]  [place]  National Suicide Hotline  (423)833-0082 MERRILYN)  Call 911 or go to emergency room  [facility]  [phone]);  [place] and [employer]  [phone]); [place], [place], [place], [place], [place], [place], [place]

## 2024-03-04 NOTE — Assessment & Plan Note (Signed)
 Unclear etiology. Previous LE doppler US  negative for DVT. Question muscular component given tenderness directly over anteriolateral musculature (extensor digitorum longus vs tibialis anterior) though no trauma or new activities and character of pain suggestive of nerve irritation, refractory to gabapentin , naproxen . Will refer to Sports Med for further evaluation. Consider Neuro eval if unrevealing.

## 2024-03-04 NOTE — Assessment & Plan Note (Signed)
 Refilled steroid cream. Await derm appt.

## 2024-03-08 ENCOUNTER — Ambulatory Visit
Admission: RE | Admit: 2024-03-08 | Discharge: 2024-03-08 | Disposition: A | Discharge status: Home / Self Care | Source: Ambulatory Visit | Attending: Neurology | Admitting: Neurology

## 2024-03-08 DIAGNOSIS — R270 Ataxia, unspecified: Secondary | ICD-10-CM

## 2024-03-08 MED ORDER — GADOPICLENOL 0.5 MMOL/ML IV SOLN: 7.0000 mL | Freq: Once | INTRAVENOUS | Status: DC | PRN

## 2024-03-09 ENCOUNTER — Encounter: Payer: Self-pay | Admitting: Neurology

## 2024-03-10 ENCOUNTER — Other Ambulatory Visit: Payer: Self-pay | Admitting: Neurology

## 2024-03-10 MED ORDER — NORTRIPTYLINE HCL 10 MG PO CAPS
10.0000 mg | ORAL_CAPSULE | Freq: Every day | ORAL | 5 refills | Status: DC
Start: 2024-03-10 — End: 2024-04-04

## 2024-03-10 NOTE — Telephone Encounter (Signed)
 done

## 2024-03-14 ENCOUNTER — Other Ambulatory Visit: Payer: Self-pay

## 2024-03-14 ENCOUNTER — Ambulatory Visit (INDEPENDENT_AMBULATORY_CARE_PROVIDER_SITE_OTHER): Admitting: Family Medicine

## 2024-03-14 ENCOUNTER — Encounter: Payer: Self-pay | Admitting: Family Medicine

## 2024-03-14 VITALS — BP 151/97 | Ht 67.0 in | Wt 255.0 lb

## 2024-03-14 DIAGNOSIS — M79662 Pain in left lower leg: Secondary | ICD-10-CM

## 2024-03-14 NOTE — Patient Instructions (Addendum)
 Your musculoskeletal ultrasound is reassuring. You don't have a bakers cyst, mass or tear of these muscle compartments We will refer you to the neurologist for evaluation of a peripheral neuropathy.  [facility] [address] [place] Phone: [phone]

## 2024-03-15 ENCOUNTER — Encounter: Payer: Self-pay | Admitting: Family Medicine

## 2024-03-15 NOTE — Progress Notes (Signed)
 PCP: Summer Donald HERO, DO  Subjective:   HPI: Patient is a 53 y.o. female here for left lower leg pain.  Patient reports she's had 5-6 weeks of unusual left lower leg pain. No acute injury or trauma to cause this. Started bothering her one day anterior left lower leg with severe pain, swelling, bruising. Included swelling into her left ankles. Pain still aching especially at night. At times cannot stand to have sheet even touch her lower leg. No rash, blisters. Lateral 3 toes going numb on this foot. Had doppler ultrasound which was negative. Tried naproxen , taping, icing.  Past Medical History:  Diagnosis Date   Anemia    Anxiety    Back pain    CAD (coronary artery disease)    Cardiomyopathy (HCC) 03/2023   Echo with EF 40 to 45% global hypokinesis.   CHF (congestive heart failure) (HCC)    Eczema    Fibroid    GERD (gastroesophageal reflux disease)    Hypertension    Migraines    Post-operative state 09/30/2022   Preterm labor    Umbilical hernia    Vaginal Pap smear, abnormal     Current Outpatient Medications on File Prior to Visit  Medication Sig Dispense Refill   albuterol  (VENTOLIN  HFA) 108 (90 Base) MCG/ACT inhaler Inhale 2 puffs into the lungs every 4 (four) hours as needed. 18 g 3   ALPRAZolam  (XANAX ) 0.25 MG tablet Take 1 tablet (0.25 mg total) by mouth 2 (two) times daily as needed for anxiety. 20 tablet 0   cetirizine  (ZYRTEC ) 10 MG tablet TAKE 1 TABLET BY MOUTH EVERY DAY 90 tablet 0   dapagliflozin  propanediol (FARXIGA ) 10 MG TABS tablet TAKE 1 TABLET BY MOUTH EVERY DAY 90 tablet 3   famotidine  (PEPCID ) 40 MG tablet TAKE 1 TABLET BY MOUTH TWICE A DAY 60 tablet 5   fluticasone  (FLONASE ) 50 MCG/ACT nasal spray Place 1 spray into both nostrils as needed.     gabapentin  (NEURONTIN ) 100 MG capsule Take 1 capsule (100 mg total) by mouth 3 (three) times daily as needed. 30 capsule 0   Galcanezumab -gnlm (EMGALITY ) 120 MG/ML SOAJ INJECT 240 MG INTO THE SKIN ONCE  FOR 1 DOSE. LOADING DOSE 1 mL 1   hydrOXYzine  (ATARAX ) 10 MG tablet TAKE 1/2 TABLET BY MOUTH EVERY DAY AT BEDTIME 15 tablet 2   metoprolol  succinate (TOPROL -XL) 100 MG 24 hr tablet Take 1 tablet (100 mg total) by mouth at bedtime. Take with or immediately following a meal. 90 tablet 3   Misc Natural Products (IBEROGAST) LIQD Take by mouth.     naproxen  (NAPROSYN ) 500 MG tablet Take 1 tablet (500 mg total) by mouth 2 (two) times daily with a meal. 30 tablet 0   nortriptyline  (PAMELOR ) 10 MG capsule Take 1 capsule (10 mg total) by mouth at bedtime. 30 capsule 5   ondansetron  (ZOFRAN ) 4 MG tablet Take 1 tablet (4 mg total) by mouth every 8 (eight) hours as needed for nausea or vomiting. 20 tablet 5   Rimegepant Sulfate (NURTEC) 75 MG TBDP Take 1 tablet (75 mg total) by mouth daily as needed. 8 tablet 5   rosuvastatin  (CRESTOR ) 10 MG tablet Take 1 tablet (10 mg total) by mouth daily. 30 tablet 3   sacubitril -valsartan  (ENTRESTO ) 49-51 MG Take 1 tablet by mouth 2 (two) times daily. 180 tablet 3   scopolamine  (TRANSDERM-SCOP) 1 MG/3DAYS Place 1 patch (1.5 mg total) onto the skin every 3 (three) days. 10 patch 5  Semaglutide -Weight Management (WEGOVY ) 2.4 MG/0.75ML SOAJ Inject 2.4 mg into the skin once a week. 3 mL 11   sertraline  (ZOLOFT ) 25 MG tablet Take 1 tablet (25 mg total) by mouth daily. Add to 50mg  tablet after 2 weeks to make 75mg . 15 tablet 0   sertraline  (ZOLOFT ) 50 MG tablet Take 1 tablet (50 mg total) by mouth daily. Take for 2 weeks, then add 25mg  tablet to make 75mg . 30 tablet 0   spironolactone  (ALDACTONE ) 25 MG tablet Take 1 tablet (25 mg total) by mouth daily. 30 tablet 6   triamcinolone  cream (KENALOG ) 0.5 % Apply topically 2 (two) times daily as needed. 15 g 2   No current facility-administered medications on file prior to visit.    Past Surgical History:  Procedure Laterality Date   CESAREAN SECTION  2000   x2 and 2002   DILITATION & CURRETTAGE/HYSTROSCOPY WITH HYDROTHERMAL  ABLATION N/A 11/19/2021   Procedure: HYSTEROSCOPY WITH HYDROTHERMAL ABLATION;  Surgeon: Alger Gong, MD;  Location: [facility];  Service: Gynecology;  Laterality: N/A;   INSERTION OF MESH  08/09/2015   Procedure: INSERTION OF MESH;  Surgeon: Donnice Lima, MD;  Location: [facility];  Service: General;;   NASAL POLYP EXCISION Bilateral 2016   pt states she had polyps removed on both sides   TRANSTHORACIC ECHOCARDIOGRAM  03/29/2023   EF 40-45%, global hypokinesis, grade 1 diastolic dysfunction, mildly reduced RV function, normal aortic and mitral valves, normal right atrial pressures   TUBAL LIGATION     UMBILICAL HERNIA REPAIR  08/09/2015   Procedure: UMBILICAL HERNIA REPAIR;  Surgeon: Donnice Lima, MD;  Location: [facility];  Service: General;;   VAGINAL HYSTERECTOMY Bilateral 09/30/2022   Procedure: HYSTERECTOMY VAGINAL WITH BILATERAL SALPINGECTOMY;  Surgeon: Lorence Ozell CROME, MD;  Location: [facility];  Service: Gynecology;  Laterality: Bilateral;    Allergies  Allergen Reactions   Latex Hives and Itching   Tizanidine  Other (See Comments)    Mouth dryness    BP (!) 151/97 (BP Location: Left Arm, Patient Position: Sitting)   Ht 5' 7 (1.702 m)   Wt 255 lb (115.7 kg)   LMP 08/10/2022 (Approximate)   BMI 39.94 kg/m       No data to display              No data to display              Objective:  Physical Exam:  Gen: NAD, comfortable in exam room  Left lower leg: No deformity, rigidity, bruising.  Mild swelling left lower leg slightly more than right.  No warmth, redness, rash. FROM ankle and knee with 5/5 strength. Tenderness to palpation over peroneal and anterior tibial musculature without defect. NVI distally.  Limited MSK u/s left lower leg: Visualization of anterior and lateral lower leg compartments in short and long axis unremarkable.  Normal vascular flow through arterial structures in these compartments.  Venous structures compressible in popliteal  fossa and no baker's cyst.  No mass.  Assessment & Plan:  1. Left leg pain - unusual combination of symptoms including sudden onset of pain with swelling and bruising 6 weeks ago.  Aching at night and toes going numb.  No back pain.  Would not expect radicular pain to cause the initial swelling/bruising she had.  Describes allodynic nature as well but no rash here to suggest shingles.  Doppler and musculoskeletal ultrasounds reassuring.  Muscles soft and history goes against compartment syndrome.  Will refer to neurology for  evaluation, possible nerve testing.

## 2024-03-21 ENCOUNTER — Encounter: Payer: Self-pay | Admitting: Family Medicine

## 2024-03-22 ENCOUNTER — Telehealth: Payer: Self-pay | Admitting: Pharmacy Technician

## 2024-03-22 ENCOUNTER — Telehealth: Payer: Self-pay | Admitting: Neurology

## 2024-03-22 ENCOUNTER — Other Ambulatory Visit (HOSPITAL_COMMUNITY): Payer: Self-pay

## 2024-03-22 NOTE — Telephone Encounter (Signed)
 Pharmacy Patient Advocate Encounter   Received notification from CoverMyMeds that prior authorization for WEGOVY  2.4MG  is required/requested.   Insurance verification completed.   The patient is insured through [facility] MEDICAID .   Per test claim: PA required; PA submitted to above mentioned insurance via LATENT Key/confirmation #/EOC [id number] Status is pending    SHE HAS NEW INS NOW. [employer] EXPIRED 02/11/24 PER NOTE

## 2024-03-22 NOTE — Telephone Encounter (Signed)
 It appears that her pharmacy will not fill her nortriptyline  as long as she is on an SSRI.  Therefore, to treat her headaches, I would like to start gabapentin .  If she is agreeable, please send in prescription for GABAPENTIN  100MG  - TAKE 1 CAPSULE TWICE DAILY FOR ONE WEEK, THEN 2 CAPSULES TWICE DAILY.  QTY 120, REFILL 0.

## 2024-03-22 NOTE — Telephone Encounter (Signed)
 Pharmacy Patient Advocate Encounter  Received notification from [facility] MEDICAID that Prior Authorization for wegovy  2.4mg  has been APPROVED from 03/22/24 to 03/22/25   PA #/Case ID/Reference #: [id number]

## 2024-03-25 NOTE — Telephone Encounter (Signed)
 LMOVM/Mychart

## 2024-03-28 ENCOUNTER — Other Ambulatory Visit (HOSPITAL_COMMUNITY): Payer: Self-pay | Admitting: Pharmacy Technician

## 2024-03-30 ENCOUNTER — Encounter: Payer: Self-pay | Admitting: Family Medicine

## 2024-04-02 ENCOUNTER — Other Ambulatory Visit: Payer: Self-pay | Admitting: Neurology

## 2024-04-04 NOTE — Progress Notes (Unsigned)
    SUBJECTIVE:   CHIEF COMPLAINT / HPI:   Rectal Bleeding - Constipation for the past couple days thinks its related to the Wegovy ; strained to go to the bathroom on Friday which was hard and caused some discomfort - Light pink bleeding noted on Saturday and felt a trickle of blood on Sunday - Denies any itching, but there is some pressure, but no pain with using the bathroom - No burning when she wipes - Faint pink bleeding this morning - Patient shared pictures of bleeding and on her bottom, picture in media tab  PERTINENT  PMH / PSH: CHF, CAD, HTN  OBJECTIVE:   BP 139/85   Pulse 90   Wt 255 lb 12.8 oz (116 kg)   LMP 08/10/2022 (Approximate)   SpO2 95%   BMI 40.06 kg/m   General: Awake and Alert in NAD HEENT: NCAT. Sclera anicteric. No rhinorrhea. Cardiovascular: RRR. No M/R/G Respiratory: CTAB, normal WOB on RA. No wheezing, crackles, rhonchi, or diminished breath sounds. Abdomen: Soft, non-tender, non-distended. Bowel sounds normoactive Extremities: Able to move all extremities. No BLE edema, no deformities or significant joint findings. Anus: No fissures noted.  Minimal dried blood per rectum. Notable external hemorrhoid, not thrombosed, with minimal TTP.  Chaperoned by CMA Rico Pesa. Skin: Warm and dry. No abrasions or rashes noted. Neuro: A&Ox3. No focal neurological deficits.  ASSESSMENT/PLAN:   Assessment & Plan Rectal bleeding Rectal bleeding likely secondary to constipation 2/2 being on Wegovy .  Minimal blood noted on exam today.  No notable unintended weight loss from previous visit, has remained stable.  UTD on colonoscopy, next due in 2032. - Advised dietary changes to increase fiber and water - Encouraged the use of MiraLAX and offered senna to help with constipation, however patient refused - POCT hemoglobin offered today, however patient refused - Recommended RTC if she continues to note persistent bleeding   [name], DO [facility]

## 2024-04-05 ENCOUNTER — Ambulatory Visit (INDEPENDENT_AMBULATORY_CARE_PROVIDER_SITE_OTHER): Admitting: Family Medicine

## 2024-04-05 VITALS — BP 139/85 | HR 90 | Wt 255.8 lb

## 2024-04-05 DIAGNOSIS — K625 Hemorrhage of anus and rectum: Secondary | ICD-10-CM

## 2024-04-05 NOTE — Patient Instructions (Addendum)
 It was great to see you today! Thank you for choosing [facility] for your primary care. Summer Brewer was seen for rectal bleeding.  Today we addressed: Rectal bleeding - Please take your Miralax daily to help with the constipation. If you notice any persistent bleeding please feel free to make another appointment again. Offered hemoglobin check today as well.  You should return to our clinic No follow-ups on file. Please arrive 15 minutes before your appointment to ensure smooth check in process.  We appreciate your efforts in making this happen.  Thank you for allowing me to participate in your care, [name], DO 04/05/2024, 9:29 AM PGY-2, [facility]

## 2024-04-10 ENCOUNTER — Other Ambulatory Visit: Payer: Self-pay | Admitting: Family Medicine

## 2024-04-11 NOTE — Telephone Encounter (Signed)
 Needs appt

## 2024-04-11 NOTE — Progress Notes (Unsigned)
 NEUROLOGY FOLLOW UP OFFICE NOTE  DARLETTE DUBOW [id number]  Assessment/Plan:   Migraine without aura, without status migrainosus, not intractable Hypertension   Migraine prevention:  Defer for now as frequency decreased Migraine rescue:  Prescribe Nurtec PRN; Zofran  4mg  for nausea  Limit use of pain relievers to no more than 2 days out of week to prevent risk of rebound or medication-overuse headache. Keep headache diary Follow up with PCP regarding blood pressure Follow up 6 months       Subjective:  MADDYX VALLIE is a 53 year old right-handed female with cardiomyopathy and HTN who follows up for migraines.  UPDATE: Headaches started increasing again.  Due to increased headaches, she had MRI of brain with and without contrast on 7/***/2025 which revealed mild cortical atrophy but no acute intracranial abnormality.  Started Vyepti in *** as well as nortriptyline  every night. *** Intensity:  severe Duration:  35-40 minutes with Nurtec Frequency:  2-3 a month ***    Current NSAIDS/analgesics:  none Current triptans:  none Current ergotamine:  none Current anti-emetic:  Zofran  4mg  Current muscle relaxants:  tizanidien 4mg  PRN Current Antihypertensive medications:  metoprolol , amlodipine , HCTZ Current Antidepressant medications:  nortriptyline  10mg  at bedtime, sertaline 25mg  Current Anticonvulsant medications:  none Current anti-CGRP:Vyepti 300mg , Nurtec PRN Current Vitamins/Herbal/Supplements:  none Current Antihistamines/Decongestants:  cetirizine , Flonase , hydroxyzine  Other therapy:  none  Caffeine:  rarely coffee Diet:  Drinks Sprite.  Does not drink water.  Sometimes skips breakfast Exercise:  no Depression:  no; Anxiety:  some Other pain:  no Sleep hygiene:  poor.  Trouble falling asleep.  Sleeps no more than 5 hours a night.  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  HISTORY:  Onset:  in her 33s Location:  left  frontal/occipital Quality:  pounding Intensity:  severe.   Aura:  absent Prodrome:  absent Associated symptoms:  Nausea, sometimes vomiting, numbness on back of head, photophobia, phonophobia, sees floaters.  She denies associated unilateral numbness or weakness. Duration:  2 days Frequency:  3 times a month (total 6 headache days a month) Frequency of abortive medication: three times a month Triggers:  Possibly perfumes Relieving factors:  rest in dark and quiet room Activity:  aggravates   MRI of brain without contrast on 11/28/2016 showed evidence of pansinusitis but no abnormal intracranial abnormality.   Past NSAIDS/analgesics:  diclofenac , meloxicam, naparoxen, ibuprofen , tramadol Past abortive triptans:  sumatriptan  tab, eletriptan  Past abortive ergotamine:  none Past muscle relaxants:  cyclobenzaprine  Past anti-emetic:  metoclopramide  Past antihypertensive medications:  propranolol , lisinopril , losartam Past antidepressant medications:  venlafaxine , dozepin, escitalopram , fluoxetine , citalopram   Past anticonvulsant medications:  topiramate, gabapentin  Past anti-CGRP:  Emgality , Ubrelvy , Nurtec PRN (effective) Other past therapies:  Medrol  dose pack    Family history of headache:  no  PAST MEDICAL HISTORY: Past Medical History:  Diagnosis Date   Anemia    Anxiety    Back pain    CAD (coronary artery disease)    Cardiomyopathy (HCC) 03/2023   Echo with EF 40 to 45% global hypokinesis.   CHF (congestive heart failure) (HCC)    Eczema    Fibroid    GERD (gastroesophageal reflux disease)    Hypertension    Migraines    Post-operative state 09/30/2022   Preterm labor    Umbilical hernia    Vaginal Pap smear, abnormal     MEDICATIONS: Current Outpatient Medications on File Prior to Visit  Medication Sig Dispense Refill   albuterol  (  VENTOLIN  HFA) 108 (90 Base) MCG/ACT inhaler Inhale 2 puffs into the lungs every 4 (four) hours as needed. 18 g 3   ALPRAZolam  (XANAX )  0.25 MG tablet Take 1 tablet (0.25 mg total) by mouth 2 (two) times daily as needed for anxiety. 20 tablet 0   cetirizine  (ZYRTEC ) 10 MG tablet TAKE 1 TABLET BY MOUTH EVERY DAY 90 tablet 0   dapagliflozin  propanediol (FARXIGA ) 10 MG TABS tablet TAKE 1 TABLET BY MOUTH EVERY DAY 90 tablet 3   famotidine  (PEPCID ) 40 MG tablet TAKE 1 TABLET BY MOUTH TWICE A DAY 60 tablet 5   fluticasone  (FLONASE ) 50 MCG/ACT nasal spray Place 1 spray into both nostrils as needed.     gabapentin  (NEURONTIN ) 100 MG capsule Take 1 capsule (100 mg total) by mouth 3 (three) times daily as needed. 30 capsule 0   Galcanezumab -gnlm (EMGALITY ) 120 MG/ML SOAJ INJECT 240 MG INTO THE SKIN ONCE FOR 1 DOSE. LOADING DOSE 1 mL 1   hydrOXYzine  (ATARAX ) 10 MG tablet TAKE 1/2 TABLET BY MOUTH EVERY DAY AT BEDTIME 15 tablet 2   metoprolol  succinate (TOPROL -XL) 100 MG 24 hr tablet Take 1 tablet (100 mg total) by mouth at bedtime. Take with or immediately following a meal. 90 tablet 3   Misc Natural Products (IBEROGAST) LIQD Take by mouth.     naproxen  (NAPROSYN ) 500 MG tablet Take 1 tablet (500 mg total) by mouth 2 (two) times daily with a meal. 30 tablet 0   nortriptyline  (PAMELOR ) 10 MG capsule TAKE 1 CAPSULE BY MOUTH AT BEDTIME. 90 capsule 2   ondansetron  (ZOFRAN ) 4 MG tablet Take 1 tablet (4 mg total) by mouth every 8 (eight) hours as needed for nausea or vomiting. 20 tablet 5   Rimegepant Sulfate (NURTEC) 75 MG TBDP Take 1 tablet (75 mg total) by mouth daily as needed. 8 tablet 5   rosuvastatin  (CRESTOR ) 10 MG tablet Take 1 tablet (10 mg total) by mouth daily. 30 tablet 3   sacubitril -valsartan  (ENTRESTO ) 49-51 MG Take 1 tablet by mouth 2 (two) times daily. 180 tablet 3   scopolamine  (TRANSDERM-SCOP) 1 MG/3DAYS Place 1 patch (1.5 mg total) onto the skin every 3 (three) days. 10 patch 5   Semaglutide -Weight Management (WEGOVY ) 2.4 MG/0.75ML SOAJ Inject 2.4 mg into the skin once a week. 3 mL 11   sertraline  (ZOLOFT ) 25 MG tablet Take 1  tablet (25 mg total) by mouth daily. Add to 50mg  tablet after 2 weeks to make 75mg . 15 tablet 0   sertraline  (ZOLOFT ) 50 MG tablet Take 1 tablet (50 mg total) by mouth daily. Take for 2 weeks, then add 25mg  tablet to make 75mg . 30 tablet 0   spironolactone  (ALDACTONE ) 25 MG tablet Take 1 tablet (25 mg total) by mouth daily. 30 tablet 6   triamcinolone  cream (KENALOG ) 0.5 % Apply topically 2 (two) times daily as needed. 15 g 2   No current facility-administered medications on file prior to visit.    ALLERGIES: Allergies  Allergen Reactions   Latex Hives and Itching   Tizanidine  Other (See Comments)    Mouth dryness    FAMILY HISTORY: Family History  Problem Relation Age of Onset   Hypertension Mother    Deep vein thrombosis Mother    Hypertension Father    Cancer - Prostate Father    Cancer Paternal Grandmother    Hypertension Other    Deep vein thrombosis Other    Leukemia Other    Colon cancer Neg Hx  Colon polyps Neg Hx    Esophageal cancer Neg Hx    Stomach cancer Neg Hx    Rectal cancer Neg Hx       Objective:  *** General: No acute distress.  Patient appears well-groomed.   Head:  Normocephalic/atraumatic Neck:  Supple.  No paraspinal tenderness.  Full range of motion. Heart:  Regular rate and rhythm. Neuro:  Alert and oriented.  Speech fluent and not dysarthric.  Language intact.  CN II-XII intact.  Bulk and tone normal.  Muscle strength 5/5 throughout.  Sensation to light touch intact.  Deep tendon reflexes 2+ throughout, toes downgoing.  Gait normal.  Romberg negative.  Juliene Dunnings, DO  CC: Donald Lai, DO

## 2024-04-12 ENCOUNTER — Ambulatory Visit: Payer: Medicaid Other | Admitting: Neurology

## 2024-04-12 ENCOUNTER — Encounter: Payer: Self-pay | Admitting: Neurology

## 2024-04-12 VITALS — BP 159/95 | HR 90 | Resp 20 | Ht 67.0 in | Wt 257.0 lb

## 2024-04-12 DIAGNOSIS — I10 Essential (primary) hypertension: Secondary | ICD-10-CM

## 2024-04-12 DIAGNOSIS — G43E09 Chronic migraine with aura, not intractable, without status migrainosus: Secondary | ICD-10-CM | POA: Diagnosis not present

## 2024-04-12 NOTE — Patient Instructions (Addendum)
 Plan to start Vyepti Infusion every 3 months. Call [facility] at [phone] If you don't hear from them in one week, contact us .  Stop Emgality  Instead of Nurtec, try Zavzpret nasal spray - blow nose, nose to toes, place in one nostril (slightly aimed towards ear) and spray.  Do not sniff in hard.  One in 24 hours.

## 2024-04-12 NOTE — Progress Notes (Signed)
 Medication Samples have been provided to the patient.  Drug name: ZavZpret       Strength: 10 mg        Qty: 2  LOT: [id number]  Exp.Date: 1/26  Dosing instructions: as needed  The patient has been instructed regarding the correct time, dose, and frequency of taking this medication, including desired effects and most common side effects.   Summer Brewer 10:03 AM 04/12/2024

## 2024-04-20 ENCOUNTER — Encounter: Payer: Self-pay | Admitting: Physician Assistant

## 2024-04-20 ENCOUNTER — Ambulatory Visit: Admitting: Physician Assistant

## 2024-04-20 VITALS — BP 148/104

## 2024-04-20 DIAGNOSIS — L2089 Other atopic dermatitis: Secondary | ICD-10-CM

## 2024-04-20 DIAGNOSIS — L209 Atopic dermatitis, unspecified: Secondary | ICD-10-CM

## 2024-04-20 MED ORDER — CLOBETASOL PROPIONATE 0.05 % EX OINT: 1.0000 | TOPICAL_OINTMENT | Freq: Two times a day (BID) | CUTANEOUS | 2 refills | Status: AC

## 2024-04-20 NOTE — Patient Instructions (Signed)

## 2024-04-20 NOTE — Progress Notes (Signed)
   New Patient Visit   Subjective  Summer Brewer is a 53 y.o. female who presents for the following: Rash of right lower leg for over a year. Her PCP gave her TMC cream which she uses 3-4 times daily. They did A BIOPSY in March 2024 which confirmed slight spongiotic dermatitis. Negative for fungus. She has a history of eczema for many years.     The following portions of the chart were reviewed this encounter and updated as appropriate: medications, allergies, medical history  Review of Systems:  No other skin or systemic complaints except as noted in HPI or Assessment and Plan.  Objective  Well appearing patient in no apparent distress; mood and affect are within normal limits.   A focused examination was performed of the following areas: Right lower elg   Relevant exam findings are noted in the Assessment and Plan.         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   Exam: Scaly pink papules coalescing to plaques   Atopic dermatitis (eczema) is a chronic, relapsing, pruritic condition that can significantly affect quality of life. It is often associated with allergic rhinitis and/or asthma and can require treatment with topical medications, phototherapy, or in severe cases biologic injectable medication (Dupixent; Adbry) or Oral JAK inhibitors.  Treatment Plan: Continue TMC 0.1% cream to less severe areas of arms.  Start Clobetasol  ointment twice daily to more severe area of right lower leg until clear. May apply warm wet ace bandage after clobetasol  followed by a dry ace bandage at night.   Recommend gentle skin care.   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  I, Roseline Hutchinson, CMA, am acting as scribe for Schon Zeiders K, PA-C .   Documentation: I have reviewed the above documentation for accuracy  and completeness, and I agree with the above.  Elvira Langston K, PA-C

## 2024-05-01 DIAGNOSIS — L249 Irritant contact dermatitis, unspecified cause: Secondary | ICD-10-CM | POA: Diagnosis not present

## 2024-05-01 DIAGNOSIS — T287XXA Corrosion of other parts of alimentary tract, initial encounter: Secondary | ICD-10-CM | POA: Diagnosis not present

## 2024-05-03 DIAGNOSIS — N760 Acute vaginitis: Secondary | ICD-10-CM | POA: Diagnosis not present

## 2024-05-06 ENCOUNTER — Encounter: Payer: Self-pay | Admitting: Neurology

## 2024-05-06 DIAGNOSIS — N390 Urinary tract infection, site not specified: Secondary | ICD-10-CM | POA: Diagnosis not present

## 2024-05-07 DIAGNOSIS — N3001 Acute cystitis with hematuria: Secondary | ICD-10-CM | POA: Diagnosis not present

## 2024-05-10 ENCOUNTER — Other Ambulatory Visit (HOSPITAL_COMMUNITY)
Admission: RE | Admit: 2024-05-10 | Discharge: 2024-05-10 | Disposition: A | Discharge status: Home / Self Care | Source: Other Acute Inpatient Hospital | Attending: Obstetrics and Gynecology | Admitting: Obstetrics and Gynecology

## 2024-05-10 ENCOUNTER — Encounter: Payer: Self-pay | Admitting: Obstetrics and Gynecology

## 2024-05-10 ENCOUNTER — Ambulatory Visit (INDEPENDENT_AMBULATORY_CARE_PROVIDER_SITE_OTHER): Admitting: Obstetrics and Gynecology

## 2024-05-10 VITALS — BP 147/91 | HR 90 | Ht 67.0 in | Wt 249.0 lb

## 2024-05-10 DIAGNOSIS — R350 Frequency of micturition: Secondary | ICD-10-CM

## 2024-05-10 DIAGNOSIS — R310 Gross hematuria: Secondary | ICD-10-CM | POA: Insufficient documentation

## 2024-05-10 DIAGNOSIS — R319 Hematuria, unspecified: Secondary | ICD-10-CM

## 2024-05-10 DIAGNOSIS — N393 Stress incontinence (female) (male): Secondary | ICD-10-CM

## 2024-05-10 DIAGNOSIS — R3989 Other symptoms and signs involving the genitourinary system: Secondary | ICD-10-CM

## 2024-05-10 DIAGNOSIS — R82998 Other abnormal findings in urine: Secondary | ICD-10-CM | POA: Insufficient documentation

## 2024-05-10 DIAGNOSIS — M6289 Other specified disorders of muscle: Secondary | ICD-10-CM

## 2024-05-10 DIAGNOSIS — Z90710 Acquired absence of both cervix and uterus: Secondary | ICD-10-CM | POA: Diagnosis not present

## 2024-05-10 LAB — URINALYSIS, ROUTINE W REFLEX MICROSCOPIC
Glucose, UA: 100 mg/dL — AB
Ketones, ur: NEGATIVE mg/dL
Nitrite: POSITIVE — AB
Protein, ur: >300 mg/dL — AB
Specific Gravity, Urine: 1.02 (ref 1.005–1.030)
pH: 6.5 (ref 5.0–8.0)

## 2024-05-10 LAB — POCT URINALYSIS DIP (CLINITEK)
Glucose, UA: 100 mg/dL — AB
Nitrite, UA: POSITIVE — AB
POC PROTEIN,UA: >=300 — AB
Spec Grav, UA: 1.025 (ref 1.010–1.025)
Urobilinogen, UA: 1 U/dL
pH, UA: 6.5 (ref 5.0–8.0)

## 2024-05-10 LAB — URINALYSIS, MICROSCOPIC (REFLEX): RBC / HPF: >50 RBC/hpf (ref 0–5)

## 2024-05-10 MED ORDER — OXYCODONE HCL 5 MG PO TABS: 5.0000 mg | ORAL_TABLET | ORAL | 0 refills | Status: DC | PRN

## 2024-05-10 MED ORDER — MIRABEGRON ER 25 MG PO TB24: 25.0000 mg | ORAL_TABLET | Freq: Every day | ORAL | 5 refills | Status: DC

## 2024-05-10 NOTE — Patient Instructions (Addendum)
 For the blood in your urine: We need a CT scan and a cystoscopy. We will get you scheduled for October 30th for the cysto and please call to schedule the CT today.     For the pelvic floor: Will send a referral for pelvic floor PT.    Today we talked about ways to manage bladder urgency such as altering your diet to avoid irritative beverages and foods (bladder diet) as well as attempting to decrease stress and other exacerbating factors.  You can also chew a plain Tums 1-3 times per day to make your urine less acidic, especially if you have eating/drinking acidic things.   There is a website with helpful information for people with bladder irritation, called the IC Network at https://www.ic-network.com. This website has more information about a healthy bladder diet and patient forums for support.  The Most Bothersome Foods* The Least Bothersome Foods*  Coffee - Regular & Decaf Tea - caffeinated Carbonated beverages - cola, non-colas, diet & caffeine-free Alcohols - Beer, Red Wine, White Wine, [address] - Grapefruit, [place], Orange, [employer] - Cranberry, Grapefruit, Orange, Pineapple Vegetables - Tomato & Tomato Products Flavor Enhancers - Hot peppers, Spicy foods, Chili, Horseradish, Vinegar, Monosodium glutamate (MSG) Artificial Sweeteners - NutraSweet, Sweet 'N Low, Equal (sweetener), Saccharin Ethnic foods - [place], [place], [place] food [employer] - low-fat & whole Fruits - Bananas, Blueberries, Honeydew melon, Pears, Raisins, Watermelon Vegetables - Broccoli, [address] Sprouts, Bland, Carrots, Cauliflower, Cayuco, Cucumber, Mushrooms, Peas, Radishes, Squash, Zucchini, White potatoes, Sweet potatoes & yams Poultry - Chicken, Eggs, [place], [employer] - Beef, Diplomatic Services operational officer, Lamb Seafood - Shrimp, [place] fish, Salmon Grains - Oat, Rice Snacks - Pretzels, Popcorn  *Mitch ALF et al. Diet and its role in interstitial cystitis/bladder pain syndrome (IC/BPS) and comorbid conditions.  [employer]. BJU Int. 2012 Jan 11.

## 2024-05-10 NOTE — Progress Notes (Signed)
  Urogynecology New Patient Evaluation and Consultation  Referring Provider: Alger Gong, MD PCP: [name], DO Date of Service: 05/10/2024  SUBJECTIVE Chief Complaint: New Patient (Initial Visit) Summer Brewer is a 53 y.o. female is here for incontinence.)  History of Present Illness: Summer Brewer is a 53 y.o. Black or African-American female seen in consultation at the request of Dr. Alger for evaluation of Incontinence.    Patient started passing small blood clots on Thursday and pain on Friday and went to UC. She was told she had a complicated UTI and has been given x3 different antibioitcs (Cefdinir, Macrobid , and Bactrim). Endorses pressure and frequency and passing large clots.   Review of records significant for: Hyst 2024  Urinary Symptoms: Leaks urine with cough/ sneeze, laughing, lifting, during sex, and with urgency Leaks 4 time(s) per days.  Pad use: 4 liners/ mini-pads per day.   Patient is bothered by UI symptoms.  Day time voids 7.  Nocturia: 4 times per night to void. Voiding dysfunction:  empties bladder well.  Patient does not use a catheter to empty bladder.  When urinating, patient feels she has no difficulties Drinks: 6-7 Bottles of water, occasional tea and daily juice (Minute maid, simply, cranberry) per day  UTIs: 4 UTI's in the last year.  (Cultures negative) Reports history of blood in urine and pyelonephritis No results found for the last 90 days.   Pelvic Organ Prolapse Symptoms:                  Patient Denies a feeling of a bulge the vaginal area   Bowel Symptom: Bowel movements: infrequrnt (on wegovy ) Stool consistency: soft  Straining: no.  Splinting: yes.  Incomplete evacuation: no.  Patient Denies accidental bowel leakage / fecal incontinence Bowel regimen: none Last colonoscopy: Date 2022,  [facility] Colonoscopy          Upcoming     Colonoscopy (Every 10 Years) Next due on 06/22/2031    06/21/2021   COLONOSCOPY   Only the first 1 history entries have been loaded, but more history exists.                Sexual Function Sexually active: yes.  Sexual orientation: Straight Pain with sex: Yes, deep in the pelvis  Pelvic Pain Admits to pelvic pain Location: Deep in pelvis Pain occurs: anytime Prior pain treatment: No Improved by: nothing Worsened by: Deep penetration and lifting   Past Medical History:  Past Medical History:  Diagnosis Date   Anemia    Anxiety    Back pain    CAD (coronary artery disease)    Cardiomyopathy (HCC) 03/2023   Echo with EF 40 to 45% global hypokinesis.   CHF (congestive heart failure) (HCC)    Eczema    Fibroid    GERD (gastroesophageal reflux disease)    Hypertension    Migraines    Post-operative state 09/30/2022   Preterm labor    Umbilical hernia    Vaginal Pap smear, abnormal      Past Surgical History:   Past Surgical History:  Procedure Laterality Date   CESAREAN SECTION  2000   x2 and 2002   DILITATION & CURRETTAGE/HYSTROSCOPY WITH HYDROTHERMAL ABLATION N/A 11/19/2021   Procedure: HYSTEROSCOPY WITH HYDROTHERMAL ABLATION;  Surgeon: Alger Gong, MD;  Location: [facility];  Service: Gynecology;  Laterality: N/A;   INSERTION OF MESH  08/09/2015   Procedure: INSERTION OF MESH;  Surgeon: Donnice Lima, MD;  Location: [facility] OR;  Service: General;;   NASAL POLYP EXCISION Bilateral 2016   pt states she had polyps removed on both sides   TRANSTHORACIC ECHOCARDIOGRAM  03/29/2023   EF 40-45%, global hypokinesis, grade 1 diastolic dysfunction, mildly reduced RV function, normal aortic and mitral valves, normal right atrial pressures   TUBAL LIGATION     UMBILICAL HERNIA REPAIR  08/09/2015   Procedure: UMBILICAL HERNIA REPAIR;  Surgeon: Donnice Lima, MD;  Location: [facility] OR;  Service: General;;   VAGINAL HYSTERECTOMY Bilateral 09/30/2022   Procedure: HYSTERECTOMY VAGINAL WITH BILATERAL SALPINGECTOMY;  Surgeon:  Lorence Ozell CROME, MD;  Location: [facility] OR;  Service: Gynecology;  Laterality: Bilateral;     Past OB/GYN History: [id number] Vaginal deliveries: 0,  Forceps/ Vacuum deliveries: 0, Cesarean section: 2 Menopausal: No Contraception: Hyst. Last pap smear was 2025.  Any history of abnormal pap smears: no. HM PAP   This patient has no relevant Health Maintenance data.     Medications: Patient has a current medication list which includes the following prescription(s): albuterol , alprazolam , cefdinir, cetirizine , clobetasol  ointment, farxiga , famotidine , fluticasone , gabapentin , hydroxyzine , metoprolol  succinate, mirabegron  er, iberogast, naproxen , nitrofurantoin  (macrocrystal-monohydrate), nortriptyline , oxycodone , phenazopyridine , sacubitril -valsartan , scopolamine , wegovy , and triamcinolone  cream.   Allergies: Patient is allergic to latex and tizanidine .   Social History:  Social History   Tobacco Use   Smoking status: Never    Passive exposure: Never   Smokeless tobacco: Never  Vaping Use   Vaping status: Never Used  Substance Use Topics   Alcohol use: No   Drug use: No    Relationship status: married Patient lives with husband and 2 children.   Patient is not employed. Regular exercise: Yes:   History of abuse: No  Family History:   Family History  Problem Relation Age of Onset   Hypertension Mother    Deep vein thrombosis Mother    Hypertension Father    Cancer - Prostate Father    Cancer Paternal Grandmother    Hypertension Other    Deep vein thrombosis Other    Leukemia Other    Colon cancer Neg Hx    Colon polyps Neg Hx    Esophageal cancer Neg Hx    Stomach cancer Neg Hx    Rectal cancer Neg Hx      Review of Systems: Review of Systems  Constitutional:  Negative for chills and fever.  Respiratory:  Negative for cough and shortness of breath.   Cardiovascular:  Positive for palpitations. Negative for chest pain.  Gastrointestinal:  Positive for abdominal pain.  Negative for blood in stool, constipation and diarrhea.  Genitourinary:  Positive for dysuria.  Skin:  Negative for rash.  Neurological:  Positive for dizziness and headaches. Negative for weakness.  Endo/Heme/Allergies:        +Hot Flashes  Psychiatric/Behavioral:  Negative for depression and suicidal ideas. The patient is nervous/anxious.      OBJECTIVE Physical Exam: Vitals:   05/10/24 1028  BP: (!) 147/91  Pulse: 90  Weight: 249 lb (112.9 kg)  Height: 5' 7 (1.702 m)    Physical Exam Vitals reviewed. Exam conducted with a chaperone present.  Constitutional:      Appearance: Normal appearance.  Pulmonary:     Effort: Pulmonary effort is normal.  Abdominal:     Palpations: Abdomen is soft.  Neurological:     General: No focal deficit present.     Mental Status: She is alert and oriented to person, place, and time.  Psychiatric:  Mood and Affect: Mood normal.        Behavior: Behavior normal. Behavior is cooperative.        Thought Content: Thought content normal.      GU / Detailed Urogynecologic Evaluation:  Pelvic Exam: Normal external female genitalia; Bartholin's and Skene's glands normal in appearance; urethral meatus normal in appearance, no urethral masses or discharge.   CST: negative  s/p hysterectomy: Speculum exam reveals normal vaginal mucosa without  atrophy and normal vaginal cuff.  Adnexa normal adnexa.    With apex supported, anterior compartment defect was reduced  Pelvic floor strength II/V  Pelvic floor musculature: Right levator non-tender, Right obturator tender, Left levator non-tender, Left obturator tender  POP-Q:   POP-Q  -2                                            Aa   -2                                           Ba  -6                                              C   3.5                                            Gh  4.5                                            Pb  8.5                                             tvl   -2.5                                            Ap  -2.5                                            Bp                                                 D      Rectal Exam:  Normal external exam  Post-Void Residual (PVR) by Bladder Scan: In order to evaluate bladder emptying, we discussed obtaining a postvoid residual and patient agreed to this procedure.  Procedure: The ultrasound unit was placed on the patient's abdomen in the suprapubic region after the  patient had voided.    Post Void Residual - 05/10/24 1135       Post Void Residual   Post Void Residual 40 mL         Picture below of clot patient reported passing  Document Information  Patient Upload: Patient Entered Attachment  [id number]  05/10/2024  Attached To:  Patient Message on 05/10/24 with Brianah Hopson G, NP  Source Information  Mychart, Generic    Laboratory Results: Lab Results  Component Value Date   COLORU red (A) 05/10/2024   CLARITYU cloudy (A) 05/10/2024   GLUCOSEUR =100 (A) 05/10/2024   BILIRUBINUR moderate (A) 05/10/2024   BILIRUBINUR MODERATE (A) 05/10/2024   KETONESU Negative 10/09/2022   SPECGRAV 1.025 05/10/2024   RBCUR large (A) 05/10/2024   PHUR 6.5 05/10/2024   PROTEINUR >300 (A) 05/10/2024   UROBILINOGEN 1.0 05/10/2024   LEUKOCYTESUR Small (1+) (A) 05/10/2024   LEUKOCYTESUR MODERATE (A) 05/10/2024    Lab Results  Component Value Date   CREATININE 1.01 (H) 01/13/2024   CREATININE 1.06 (H) 09/30/2023   CREATININE 1.10 (H) 09/30/2023    Lab Results  Component Value Date   HGBA1C 5.9 (A) 04/27/2023    Lab Results  Component Value Date   HGB 12.6 09/15/2023     ASSESSMENT AND PLAN Ms. Morr is a 53 y.o. with:  1. Gross hematuria   2. Urinary frequency   3. Leukocytes in urine   4. Hematuria, unspecified type   5. Pelvic floor dysfunction in female   6. Status post hysterectomy   7. SUI (stress urinary incontinence, female)    Patient had  significant blood clots in her urine and showed a picture of a large clot she has passed. Patient reports it was painful in nature. No fevers or flank pain. We will plan for a CT scan due to the gross hematuria as well as an in office cystoscopy to rule out Bladder lesions, cancer, or other mass. Patient is okay with this plan of care. Strict precautions given for ER evaluation including worsening pain, nausea/emesis, flank pain, and fevers.  We discussed the symptoms of overactive bladder (OAB), which include urinary urgency, urinary frequency, nocturia, with or without urge incontinence.  While we do not know the exact etiology of OAB, several treatment options exist. We discussed management including behavioral therapy (decreasing bladder irritants, urge suppression strategies, timed voids, bladder retraining), physical therapy, medication. For anticholinergic medications, we discussed the potential side effects of anticholinergics including dry eyes, dry mouth, constipation, cognitive impairment and urinary retention. For Beta-3 agonist medication, we discussed the potential side effect of elevated blood pressure which is more likely to occur in individuals with uncontrolled hypertension. Patient is not a good candidate for anticholinergic medications as she is on Wegovy  and already has infrequent bowel movements. Will send in Myrbetriq  25mg  to try and see if this assists her bladder overactivity.  Will send urine for culture. Have requested culture results from UC.  Will send for micro. Cysto to be scheduled as well as CT.  Patient has obturator spasms and poor control of her pelvic floor muscles. Will send for pelvic floor PT.  Patient has had more issues with pelvic floor laxity since her hysterectomy. Would benefit from pelvic floor PT.  CST - on exam today. If this becomes more bothersome we can discuss treatment options further.   Patient to return for Cysto.    Teoman Giraud G Terron Merfeld, NP

## 2024-05-11 ENCOUNTER — Ambulatory Visit: Payer: Self-pay | Admitting: Obstetrics and Gynecology

## 2024-05-11 ENCOUNTER — Ambulatory Visit (HOSPITAL_BASED_OUTPATIENT_CLINIC_OR_DEPARTMENT_OTHER)
Admission: RE | Admit: 2024-05-11 | Discharge: 2024-05-11 | Disposition: A | Discharge status: Home / Self Care | Source: Ambulatory Visit | Attending: Obstetrics and Gynecology | Admitting: Obstetrics and Gynecology

## 2024-05-11 DIAGNOSIS — R310 Gross hematuria: Secondary | ICD-10-CM | POA: Insufficient documentation

## 2024-05-11 DIAGNOSIS — Z90710 Acquired absence of both cervix and uterus: Secondary | ICD-10-CM | POA: Diagnosis not present

## 2024-05-11 DIAGNOSIS — N3289 Other specified disorders of bladder: Secondary | ICD-10-CM | POA: Diagnosis not present

## 2024-05-11 DIAGNOSIS — K429 Umbilical hernia without obstruction or gangrene: Secondary | ICD-10-CM | POA: Diagnosis not present

## 2024-05-11 LAB — URINE CULTURE: Culture: NO GROWTH

## 2024-05-11 MED ORDER — IOHEXOL 300 MG/ML  SOLN
125.0000 mL | Freq: Once | INTRAMUSCULAR | Status: AC | PRN
  Administered 2024-05-11: 125 mL via INTRAVENOUS

## 2024-05-12 ENCOUNTER — Other Ambulatory Visit: Payer: Self-pay | Admitting: Obstetrics and Gynecology

## 2024-05-12 MED ORDER — DIAZEPAM 5 MG PO TABS: ORAL_TABLET | ORAL | 0 refills | Status: DC

## 2024-05-12 MED ORDER — PHENAZOPYRIDINE HCL 200 MG PO TABS: 200.0000 mg | ORAL_TABLET | Freq: Three times a day (TID) | ORAL | 0 refills | Status: DC | PRN

## 2024-05-13 ENCOUNTER — Ambulatory Visit

## 2024-05-13 ENCOUNTER — Ambulatory Visit: Admitting: Obstetrics

## 2024-05-13 ENCOUNTER — Other Ambulatory Visit: Payer: Self-pay | Admitting: Obstetrics

## 2024-05-13 ENCOUNTER — Ambulatory Visit: Payer: Self-pay | Admitting: Obstetrics

## 2024-05-13 ENCOUNTER — Encounter: Payer: Self-pay | Admitting: Obstetrics

## 2024-05-13 ENCOUNTER — Other Ambulatory Visit (HOSPITAL_COMMUNITY)
Admission: RE | Admit: 2024-05-13 | Discharge: 2024-05-13 | Disposition: A | Discharge status: Home / Self Care | Source: Ambulatory Visit | Attending: Obstetrics | Admitting: Obstetrics

## 2024-05-13 VITALS — BP 182/103 | HR 97

## 2024-05-13 DIAGNOSIS — N898 Other specified noninflammatory disorders of vagina: Secondary | ICD-10-CM

## 2024-05-13 DIAGNOSIS — R310 Gross hematuria: Secondary | ICD-10-CM | POA: Diagnosis not present

## 2024-05-13 DIAGNOSIS — R3989 Other symptoms and signs involving the genitourinary system: Secondary | ICD-10-CM | POA: Diagnosis not present

## 2024-05-13 LAB — POCT URINALYSIS DIP (CLINITEK)
Bilirubin, UA: NEGATIVE
Glucose, UA: NEGATIVE mg/dL
Ketones, POC UA: NEGATIVE mg/dL
Nitrite, UA: NEGATIVE
POC PROTEIN,UA: 100 — AB
Spec Grav, UA: 1.02 (ref 1.010–1.025)
Urobilinogen, UA: 0.2 U/dL
pH, UA: 7 (ref 5.0–8.0)

## 2024-05-13 MED ORDER — LIDOCAINE HCL 2 % IJ SOLN
20.0000 mL | Freq: Once | INTRAMUSCULAR | Status: AC
  Administered 2024-05-13: 400 mg

## 2024-05-13 MED ORDER — BUPIVACAINE HCL 0.25 % IJ SOLN
20.0000 mL | Freq: Once | INTRAMUSCULAR | Status: AC
  Administered 2024-05-13: 20 mL

## 2024-05-13 MED ORDER — SODIUM BICARBONATE 8.4 % IV SOLN
5.0000 mL | Freq: Once | INTRAVENOUS | Status: AC
  Administered 2024-05-13: 5 mL

## 2024-05-13 MED ORDER — URO-MP 118 MG PO CAPS: 1.0000 | ORAL_CAPSULE | Freq: Three times a day (TID) | ORAL | 0 refills | Status: DC | PRN

## 2024-05-13 MED ORDER — HEPARIN SODIUM (PORCINE) 10000 UNIT/ML IJ SOLN
10000.0000 [IU] | Freq: Once | INTRAMUSCULAR | Status: AC
  Administered 2024-05-13: 10000 [IU] via INTRAVESICAL

## 2024-05-13 NOTE — Patient Instructions (Addendum)
 Taking Care of Yourself after Urodynamics, Cystoscopy, Bulkamid Injection, or Botox Injection   Drink plenty of water for a day or two following your procedure. Try to have about 8 ounces (one cup) at a time, and do this 6 times or more per day unless you have fluid restrictitons AVOID irritative beverages such as coffee, tea, soda, alcoholic or citrus drinks for a day or two, as this may cause burning with urination.  For the first 1-2 days after the procedure, your urine may be pink or red in color. You may have some blood in your urine as a normal side effect of the procedure. Large amounts of bleeding or difficulty urinating are NOT normal. Call the nurse line if this happens or go to the nearest Emergency Room if the bleeding is heavy or you cannot urinate at all and it is after hours. If you had a Bulkamid injection in the urethra and need to be catheterized, ask for a pediatric catheter to be used (size 10 or 12-French) so the material is not pushed out of place.   You may experience some discomfort or a burning sensation with urination after having this procedure. You can use over the counter Azo or pyridium  to help with burning and follow the instructions on the packaging. If it does not improve within 1-2 days, or other symptoms appear (fever, chills, or difficulty urinating) call the office to speak to a nurse.  You may return to normal daily activities such as work, school, driving, exercising and housework on the day of the procedure. If your doctor gave you a prescription, take it as ordered.   Please return in 1 week for bladder instillation if you experience relief from your symptoms.   Continue Tylenol  up to 500mg  every 6 hours as needed.   Use Uro-MP  1 tab up to 3 times a day as needed for pain  Discontinue your nortriptyline .   Resume hydroxyzine  at bedtime, if you experience sedation take it earlier in the night. You can increase to twice a day if you don't experience sedation  and experience some relief of pain.

## 2024-05-13 NOTE — Assessment & Plan Note (Addendum)
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  We also discussed using pyridium  and similar over-the-counter medications for pain relief as needed. We discussed the pentad of medications including Tums, an antihistamine such as Vistaril , amitriptyline , and L-arginine.  We also discussed in-office bladder instillations for pain flares, as well as cystoscopy with hydrodistention in the operating room, which can be both diagnostic and therapeutic.  - continue tylenol  PRN pain - Rx Uro-MP  as needed and resume hydoxyzine at bedtime to reduce inflammation (uses as needed) if she discontinues nortriptyline . Patient reports desire to discontinue nortriptyline , pending message to Dr. Skeet and postpone changing  medications until after review with Dr. Skeet due to risk of withdrawal symptoms. - through joint decision making, proceed with instillation with improvement of pain from 11/10 down to 4/10 today. Return for repeat in 1 week for a total of 6.

## 2024-05-13 NOTE — Assessment & Plan Note (Signed)
-   pending Nuswab to r/o infectious etiology

## 2024-05-13 NOTE — Addendum Note (Signed)
 Addended by: KRYSTAL ANDREE GAILS on: 05/13/2024 02:02 PM   Modules accepted: Orders

## 2024-05-13 NOTE — Progress Notes (Addendum)
 CYSTOSCOPY  CC:  This is a 53 y.o. with gross hematuria who presents today for cystoscopy.  Started passing blood clots 05/05/24 and progressed to pain on 05/06/24. Reports 4 episodes of UTI symptoms with negative urine cultures for around 10/2022. UTI symptoms starts with malodorous urine, progress to urinary frequency, pressure, and pain.  Ibuprofen  800mg  and pyridium  BID without relief. Avoids NSAIDs due to Cr 1.01 on 01/13/24. Tylenol  500mg  x 5 tabs. Heating pad without relief. Denies fever, chills, back pain.  Pain 11/10 05/10/24 urine culture negative Denies tobacco use, pelvic radiation, history of chemotherapy, family history of kidney cancer Worked at [employer], denies exposure to chemicals at work  Treated for complicated UTI at urgent care with Cefdinir, Macrobid , and Bactrim  UTI symptoms started around AUB with pelvic pressure. Underwent TVH with bilateral salpingectomy in 09/30/22 by Dr. Ervin for AUB.  Farxiga  and Wegovy  for T2DM, last HbA1C 5.9 on 04/27/23 Started nortriptyline  03/2024 for migraines at bedtime, denies relief and desires to discontinue Reports GERD symptoms on Wegoxy, started famotidine  twice a day Reports history of dyspareunia prior to hysterectomy, attributed to partner's size  CT urogram 05/11/24 CLINICAL DATA:  Gross hematuria.   EXAM: CT ABDOMEN AND PELVIS WITHOUT AND WITH CONTRAST   TECHNIQUE: Multidetector CT imaging of the abdomen and pelvis was performed following the standard protocol before and following the bolus administration of intravenous contrast.   RADIATION DOSE REDUCTION: This exam was performed according to the departmental dose-optimization program which includes automated exposure control, adjustment of the mA and/or kV according to patient size and/or use of iterative reconstruction technique.   CONTRAST:  OMNIPAQUE  IOHEXOL  300 MG/ML  SOLN   COMPARISON:  CT scan 05/14/2022   FINDINGS: Lower chest: The lung bases are clear  of acute process. No pleural effusion or pulmonary lesions. The heart is normal in size. No pericardial effusion. The distal esophagus and aorta are unremarkable.   Hepatobiliary: No focal liver abnormality is seen. No gallstones, gallbladder wall thickening, or biliary dilatation.   Pancreas: Unremarkable. No pancreatic ductal dilatation or surrounding inflammatory changes.   Spleen: Normal in size without focal abnormality. Small accessory spleen noted.   Adrenals/Urinary Tract: Adrenal glands are normal.   No renal, ureteral or bladder calculi. Both kidneys demonstrate normal enhancement/perfusion. No worrisome renal lesions. The delayed images do not demonstrate any collecting system abnormalities. Both ureters are normal.   Moderate bladder wall thickening with surrounding interstitial changes. Interstitial/inflammatory changes also around the distal ureters. No discrete bladder mass is identified but there is an area of moderate asymmetric mucosal enhancement along right-side of the bladder. This could be focal inflammatory changes in a background of diffuse cystitis. Could not totally exclude the possibility of an infiltrating neoplastic process. Recommend cystoscopic evaluation.   Stomach/Bowel: The stomach, duodenum, small bowel and colon are grossly normal without oral contrast. No inflammatory changes, mass lesions or obstructive findings. The appendix is normal.   Vascular/Lymphatic: The aorta is normal in caliber. No dissection. The branch vessels are patent. The major venous structures are patent. No mesenteric or retroperitoneal mass or adenopathy. Small scattered lymph nodes are noted.   Reproductive: The uterus is surgically absent. Both ovaries are still present and appear.   Other: Stable small periumbilical abdominal hernia containing fat. No free fluid or free air. No subcutaneous lesions.   Musculoskeletal: No significant bony findings. Stable  degenerative disc disease at L5-S1.   IMPRESSION: 1. No renal, ureteral or bladder calculi or discrete mass. 2.  Moderate bladder wall thickening with surrounding interstitial changes suggesting cystitis. Interstitial/inflammatory changes also around the distal ureters. No discrete bladder mass is identified but there is an area of moderate mucosal enhancement along the right-side of the bladder. This could be focal inflammatory changes in a background of diffuse cystitis. Could not totally exclude the possibility of an infiltrating neoplastic process. Recommend cystoscopic evaluation. 3. No abdominal/pelvic lymphadenopathy. 4. Status post hysterectomy. Both ovaries are still present and appear normal. 5. Stable small periumbilical abdominal hernia containing fat.     Electronically Signed   By: MYRTIS Stammer M.D.   On: 05/11/2024 12:00  Results for orders placed or performed in visit on 05/13/24  POCT URINALYSIS DIP (CLINITEK)   Collection Time: 05/13/24  8:24 AM  Result Value Ref Range   Color, UA red (A) yellow   Clarity, UA cloudy (A) clear   Glucose, UA negative negative mg/dL   Bilirubin, UA negative negative   Ketones, POC UA negative negative mg/dL   Spec Grav, UA 8.979 8.989 - 1.025   Blood, UA large (A) negative   pH, UA 7.0 5.0 - 8.0   POC PROTEIN,UA =100 (A) negative, trace   Urobilinogen, UA 0.2 0.2 or 1.0 E.U./dL   Nitrite, UA Negative Negative   Leukocytes, UA Small (1+) (A) Negative     BP (!) 182/103   Pulse 97   LMP 08/10/2022 (Approximate)   CYSTOSCOPY: A time out was performed.  The periurethral area was prepped and draped in a sterile manner.  2% lidocaine  jetpack was inserted at the urethral meatus. The urethra and bladder were visualized with flexible cystoscope. Visualization was limited due to blood clot noted, the cystoscope was removed and bladder drained.   Verbal consent was obtained to perform retrograde filling with irrigation:   The  bladder was then backfilled with sterile water by gravity with sterile water.  First sensation: 50mL First Desire: Strong Desire: Capacity:  Interpretation: CMG showed increased sensation, and decreased cystometric capacity.   The urethra and bladder were visualized with flexible cystoscope.  She had normal urethral coaptation and normal urethral mucosa.  She had abnormal  finding with multiple areas of erythema with central white clearing over bladder mucosa on the right posterior wall and towards bladder dome. She had bilateral clear efflux from both ureteral orifices.  She had no squamous metaplasia at the trigone, no trabeculations, cellules or diverticuli.    Physical Exam Constitutional:      General: She is not in acute distress.    Appearance: Normal appearance.  Genitourinary:     Bladder and urethral meatus normal.     No lesions in the vagina.     Right Labia: No rash, tenderness, lesions, skin changes or Bartholin's cyst.    Left Labia: No tenderness, lesions, skin changes, Bartholin's cyst or rash.    Vaginal discharge (white, thick) present.     No vaginal erythema, tenderness, bleeding, ulceration or granulation tissue.     No vaginal atrophy present.    Cervix is absent.     Uterus is absent.     Urethral meatus caruncle not present.    No urethral prolapse, tenderness, mass, hypermobility or discharge present.     Bladder is tender and urgency on palpation.     Bladder masses not present.      Levator ani not tender, obturator internus not tender, no asymmetrical contractions present and no pelvic spasms present. Cardiovascular:     Rate and  Rhythm: Normal rate.  Pulmonary:     Effort: Pulmonary effort is normal. No respiratory distress.  Abdominal:     General: There is no distension.     Palpations: Abdomen is soft. There is no mass.     Tenderness: There is no abdominal tenderness.     Hernia: No hernia is present.   Neurological:      Mental Status: She is alert.  Vitals reviewed. Exam conducted with a chaperone present.    Bladder Instillation: The patient was identified and verbally consented for the procedure.  The urethra was prepped with Betadine  x 3. A 16 Fr foley catheter was inserted the bladder and drained for ____ cc. The foley was then attached to a 60 mL syringe with the plunger removed.  The medication was slowly poured into the bladder via the syringe and foley.  The medication consisted of: 20ml of Lidocaine  2%, 20mL of Bupivicaine 0.25%, 10,000 units/mL Heparin , 5mL Sodium Bicarbonate  8.4%.  The foley was removed and the patient was asked to hold the liquids in her bladder for 30-60 minutes if possible.    Lab Results  Component Value Date   CREATININE 1.01 (H) 01/13/2024   Lab Results  Component Value Date   HGBA1C 5.9 (A) 04/27/2023   ASSESSMENT:  53 y.o. with gross hematuria. 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  - discussed need for bladder biopsy with fulguration and hydrodistention with cystoscopy due to pain - CT urogram 05/11/24 showed moderate bladder wall thickening with surrounding interstitial Changes to distal ureters - discussed possible need for referral to urology for additional treatments if abnormal biopsy  Orders: -     POCT URINALYSIS DIP (CLINITEK) -     Cytology - Non PAP;; Future -     Ambulatory Referral For Surgery Scheduling  Vaginal discharge Assessment & Plan: - pending Nuswab to r/o infectious etiology  Orders: -     Cervicovaginal  ancillary only  Bladder pain Assessment & Plan: - reports history of dyspareunia and recurrent UTI with negative cultures - 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  We also discussed using pyridium  and similar over-the-counter medications for pain relief as needed. We discussed the pentad of medications including Tums, an antihistamine such as Vistaril , amitriptyline , and L-arginine.  We also discussed in-office bladder instillations for pain flares, as well as cystoscopy with hydrodistention in the operating room, which can be both diagnostic and therapeutic.  - continue tylenol  PRN pain - Rx Uro-MP  as needed and resume hydoxyzine at bedtime to reduce inflammation (uses as needed) if she discontinues nortriptyline . Patient reports desire to discontinue nortriptyline , pending message to Dr. Skeet and postpone changing  medications until after review with Dr. Skeet due to risk of withdrawal symptoms. - through joint decision making, proceed with instillation with improvement of pain from 11/10 down to 4/10 today. Return for repeat in 1 week for a total of 6.   Orders: -     Uro-MP ; Take 1  capsule (118 mg total) by mouth 3 (three) times daily as needed.  Dispense: 30 capsule; Refill: 0 -     Ambulatory Referral For Surgery Scheduling   Time spent: I spent 31 minutes dedicated to the care of this patient on the date of this encounter to include pre-visit review of records, face-to-face time with the patient discussing hematuria, bladder pain syndrome, vaginal discharge, and post visit documentation and ordering medication/ testing.    PLAN:  Follow-up to discuss findings and treatment options.  All questions answered and post-procedures instructions were given  Lianne ONEIDA Gillis, MD

## 2024-05-13 NOTE — Telephone Encounter (Signed)
 Tried calling [facility] twice , no answer. Sent a message to Miranda Croft to see if she could move appt up. Per Dr.Jaffe.

## 2024-05-13 NOTE — Assessment & Plan Note (Signed)
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  - discussed need for bladder biopsy with fulguration and hydrodistention with cystoscopy due to pain - CT urogram 05/11/24 showed moderate bladder wall thickening with surrounding interstitial Changes to distal ureters - discussed possible need for referral to urology for additional treatments if abnormal biopsy

## 2024-05-15 DIAGNOSIS — N760 Acute vaginitis: Secondary | ICD-10-CM | POA: Diagnosis not present

## 2024-05-16 LAB — CERVICOVAGINAL ANCILLARY ONLY
Bacterial Vaginitis (gardnerella): POSITIVE — AB
Candida Glabrata: NEGATIVE
Candida Vaginitis: POSITIVE — AB
Comment: NEGATIVE
Comment: NEGATIVE
Comment: NEGATIVE

## 2024-05-16 MED ORDER — METRONIDAZOLE 500 MG PO TABS: 500.0000 mg | ORAL_TABLET | Freq: Two times a day (BID) | ORAL | 0 refills | Status: AC

## 2024-05-16 MED ORDER — FLUCONAZOLE 150 MG PO TABS: 150.0000 mg | ORAL_TABLET | Freq: Once | ORAL | 1 refills | Status: AC

## 2024-05-17 ENCOUNTER — Ambulatory Visit: Payer: Self-pay | Admitting: Obstetrics

## 2024-05-17 LAB — CYTOLOGY - NON PAP

## 2024-05-17 MED ORDER — METHOCARBAMOL 500 MG PO TABS: 500.0000 mg | ORAL_TABLET | Freq: Three times a day (TID) | ORAL | 0 refills | Status: AC

## 2024-05-18 ENCOUNTER — Telehealth: Payer: Self-pay

## 2024-05-18 ENCOUNTER — Other Ambulatory Visit (HOSPITAL_COMMUNITY): Payer: Self-pay

## 2024-05-18 NOTE — Telephone Encounter (Signed)
 Rec'd PA for patients Farxiga  medication.   What's the dx for use? Not seeing a diagnosis of diabetes on her chart.

## 2024-05-19 ENCOUNTER — Ambulatory Visit

## 2024-05-19 ENCOUNTER — Other Ambulatory Visit (HOSPITAL_COMMUNITY): Payer: Self-pay | Admitting: Neurology

## 2024-05-19 NOTE — Telephone Encounter (Signed)
 Prior authorization submitted for FARXIGA  to [facility] MEDICAID via Latent.   Key: [id number]

## 2024-05-19 NOTE — Telephone Encounter (Signed)
 Pharmacy Patient Advocate Encounter  Received notification from [facility] MEDICAID that Prior Authorization for FARXIGA  has been APPROVED from 05/19/24 to 05/19/25   PA #/Case ID/Reference #: [id number]   Thank you!!

## 2024-05-23 ENCOUNTER — Other Ambulatory Visit: Payer: Self-pay | Admitting: Cardiology

## 2024-05-31 ENCOUNTER — Ambulatory Visit

## 2024-06-02 ENCOUNTER — Encounter: Payer: Self-pay | Admitting: Obstetrics

## 2024-06-02 NOTE — Telephone Encounter (Signed)
 LVMTRC

## 2024-06-03 ENCOUNTER — Other Ambulatory Visit: Payer: Self-pay | Admitting: Obstetrics

## 2024-06-03 DIAGNOSIS — N898 Other specified noninflammatory disorders of vagina: Secondary | ICD-10-CM

## 2024-06-03 DIAGNOSIS — R3989 Other symptoms and signs involving the genitourinary system: Secondary | ICD-10-CM

## 2024-06-03 MED ORDER — METRONIDAZOLE 500 MG PO TABS: 500.0000 mg | ORAL_TABLET | Freq: Two times a day (BID) | ORAL | 0 refills | Status: AC

## 2024-06-03 NOTE — Progress Notes (Signed)
 Rx flagyl  sent to [place]  per patient request.

## 2024-06-06 ENCOUNTER — Inpatient Hospital Stay (HOSPITAL_COMMUNITY): Admission: RE | Admit: 2024-06-06 | Source: Ambulatory Visit

## 2024-06-07 ENCOUNTER — Ambulatory Visit

## 2024-06-13 ENCOUNTER — Ambulatory Visit

## 2024-06-16 ENCOUNTER — Encounter (HOSPITAL_COMMUNITY): Payer: Self-pay

## 2024-06-17 ENCOUNTER — Telehealth: Payer: Self-pay | Admitting: Pharmacy Technician

## 2024-06-17 NOTE — Telephone Encounter (Signed)
 Pharmacy Patient Advocate Encounter  Received notification from [facility] MEDICAID that Prior Authorization for wegovy  has been DENIED.  Full denial letter will be uploaded to the media tab. See denial reason below.     Could not find where she had a heart attack, stroke or pad

## 2024-06-17 NOTE — Telephone Encounter (Signed)
   Pharmacy Patient Advocate Encounter   Received notification from CoverMyMeds that prior authorization for wegovy  is required/requested.   Insurance verification completed.   The patient is insured through [facility] MEDICAID.   Per test claim: PA required; PA submitted to above mentioned insurance via Latent Key/confirmation #/EOC [id number] Status is pending   No mi, no stroke, no pad

## 2024-06-18 ENCOUNTER — Encounter: Payer: Self-pay | Admitting: Cardiology

## 2024-06-21 ENCOUNTER — Encounter: Payer: Self-pay | Admitting: *Deleted

## 2024-06-21 ENCOUNTER — Other Ambulatory Visit (HOSPITAL_COMMUNITY): Payer: Self-pay

## 2024-06-21 ENCOUNTER — Ambulatory Visit

## 2024-06-21 NOTE — Telephone Encounter (Signed)
 Pharmacy Patient Advocate Encounter   Received notification from Physician's Office that prior authorization for ZEPBOUND is required/requested.   Insurance verification completed.   The patient is insured through [facility].   Per test claim: PA required; PA submitted to above mentioned insurance via Latent Key/confirmation #/EOC [facility] Status is pending

## 2024-06-27 ENCOUNTER — Telehealth: Payer: Self-pay | Admitting: Cardiology

## 2024-06-27 ENCOUNTER — Other Ambulatory Visit: Payer: Self-pay | Admitting: Cardiology

## 2024-06-27 ENCOUNTER — Ambulatory Visit

## 2024-06-27 DIAGNOSIS — I10 Essential (primary) hypertension: Secondary | ICD-10-CM

## 2024-06-27 MED ORDER — SACUBITRIL-VALSARTAN 49-51 MG PO TABS: 1.0000 | ORAL_TABLET | Freq: Two times a day (BID) | ORAL | 0 refills | Status: DC

## 2024-06-27 MED ORDER — METOPROLOL SUCCINATE ER 100 MG PO TB24: 100.0000 mg | ORAL_TABLET | Freq: Every day | ORAL | 0 refills | Status: DC

## 2024-06-27 NOTE — Telephone Encounter (Signed)
 Pt c/o medication issue:  1. Name of Medication:   Semaglutide -Weight Management (WEGOVY ) 2.4 MG/0.75ML SOAJ    2. How are you currently taking this medication (dosage and times per day)? Inject 2.4 mg into the skin once a week.   3. Are you having a reaction (difficulty breathing--STAT)? No   4. What is your medication issue? Pts insurance is no longer paying for medication. She would like to start an eligibility appeal since she is taking it for heart condition. Pt is returning callback. Please advise.

## 2024-06-27 NOTE — Telephone Encounter (Signed)
*  STAT* If patient is at the pharmacy, call can be transferred to refill team.   1. Which medications need to be refilled? (please list name of each medication and dose if known)   metoprolol  succinate (TOPROL -XL) 100 MG 24 hr tablet    sacubitril -valsartan  (ENTRESTO ) 49-51 MG     2. Would you like to learn more about the convenience, safety, & potential cost savings by using the [facility]? No    3. Are you open to using the [facility] (Type [facility]. No    4. Which pharmacy/location (including street and city if local pharmacy) is medication to be sent to? [id number] - [place], [place] - [address] [place] [address]    5. Do they need a 30 day or 90 day supply? 90 day

## 2024-06-27 NOTE — Telephone Encounter (Signed)
 Refills sent in

## 2024-06-30 ENCOUNTER — Telehealth: Payer: Self-pay | Admitting: Pharmacy Technician

## 2024-06-30 ENCOUNTER — Ambulatory Visit: Admitting: Physical Therapy

## 2024-06-30 ENCOUNTER — Other Ambulatory Visit (HOSPITAL_COMMUNITY): Payer: Self-pay

## 2024-06-30 NOTE — Telephone Encounter (Signed)
 Pharmacist is requesting that pt's medication Entresto  be prescribed as name brand so pt can get medication cheaper. Would you like to prescribe medication as name brand? Please advise

## 2024-06-30 NOTE — Telephone Encounter (Signed)
   Ran test claim for entresto . For a 30 day supply and the co-pay is 4.00 . PA is not needed at this time. Nothing saying this is a transition fill.   This test claim was processed through [facility]- copay amounts may vary at other pharmacies due to pharmacy/plan contracts, or as the patient moves through the different stages of their insurance plan.      Sent message back to see if rx can be daw y1

## 2024-07-01 ENCOUNTER — Telehealth: Payer: Self-pay | Admitting: Pharmacy Technician

## 2024-07-01 MED ORDER — ENTRESTO 49-51 MG PO TABS: 1.0000 | ORAL_TABLET | Freq: Two times a day (BID) | ORAL | 0 refills | Status: DC

## 2024-07-01 NOTE — Telephone Encounter (Signed)
 Pharmacy Patient Advocate Encounter   Received notification from Pt Calls Messages that prior authorization for zepbound is required/requested.   Insurance verification completed.   The patient is insured through [facility] MEDICAID.   Per test claim: PA required; PA submitted to above mentioned insurance via Latent Key/confirmation #/EOC [id number] Status is pending

## 2024-07-01 NOTE — Telephone Encounter (Signed)
 It is okay to switch to brand if insurance payment is cheaper,per Dr Anner

## 2024-07-01 NOTE — Telephone Encounter (Signed)
 I see now this was previously denied on 06/17/24. If this gets denied again, she will have to sign the denial and it will have to be appealed

## 2024-07-04 ENCOUNTER — Ambulatory Visit

## 2024-07-04 ENCOUNTER — Encounter: Payer: Self-pay | Admitting: Obstetrics

## 2024-07-04 NOTE — Telephone Encounter (Signed)
 Emailed denial as requested to patient

## 2024-07-04 NOTE — Telephone Encounter (Signed)
 Pharmacy Patient Advocate Encounter  Received notification from [facility] MEDICAID that Prior Authorization for zepbound has been DENIED.  See denial reason below. No denial letter attached in CMM. Will attach denial letter to Media tab once received.   PA #/Case ID/Reference #: [id number]     Sent patient a message to sign the denial so we can appeal

## 2024-07-04 NOTE — Telephone Encounter (Signed)
 Patient is aware  awaiting for prior authorization for Zepbound

## 2024-07-11 ENCOUNTER — Other Ambulatory Visit: Payer: Self-pay | Admitting: Obstetrics

## 2024-07-11 DIAGNOSIS — R3989 Other symptoms and signs involving the genitourinary system: Secondary | ICD-10-CM

## 2024-07-11 MED ORDER — URO-MP 118 MG PO CAPS: 1.0000 | ORAL_CAPSULE | Freq: Three times a day (TID) | ORAL | 0 refills | Status: DC | PRN

## 2024-07-11 NOTE — Progress Notes (Signed)
 [facility] [place] [facility]

## 2024-07-15 ENCOUNTER — Encounter: Payer: Self-pay | Admitting: *Deleted

## 2024-07-19 ENCOUNTER — Ambulatory Visit: Admitting: Emergency Medicine

## 2024-07-20 ENCOUNTER — Ambulatory Visit: Admitting: Physician Assistant

## 2024-07-20 ENCOUNTER — Encounter: Payer: Self-pay | Admitting: Pharmacy Technician

## 2024-07-21 ENCOUNTER — Telehealth: Admitting: Physician Assistant

## 2024-07-21 DIAGNOSIS — H00015 Hordeolum externum left lower eyelid: Secondary | ICD-10-CM

## 2024-07-21 NOTE — Progress Notes (Signed)
 Patient is currently in [place] . Advised to see local urgent care or ER near her.

## 2024-08-01 ENCOUNTER — Ambulatory Visit: Admitting: Physician Assistant

## 2024-08-07 NOTE — Progress Notes (Unsigned)
   SUBJECTIVE:   CHIEF COMPLAINT / HPI:   Surgical Pre-operative Evaluation:  Procedure: Cystoscopy with bladder hydrodistension and biopsy Procedural risk: intermediate   Anesthesia: general  PMH: HTN, HFmrEF, OSA not on CPAP, prediabetes, HLD, iron deficiency anemia, migraine Meds: farxiga , entresto   Surgical History:  The patient denies any complication with anesthesia, bleeding, or post-operative confusion, nausea, or vomiting in prior surgical interventions. The patient denies any history of spinal surgeries.  Family History: The patient denies any family history of complications with anesthesia and VTE.  Social History: Tobacco Use: never  Alcohol Use: none Other substance use: none  Medication Adjustments:  - hold farxiga  3 days prior to surgery. You can resume this after surgery. - hold your entresto  the day of surgery, you can resume this after surgery. - hold your hydroxyzine , robaxin  (muscle relaxer) the night before surgery.   Risk Stratification Tool: Charlanne Calculator: 0.4 % risk of MI or cardiac event, intraoperatively or up to 30 days post-op  Functional Capacity:  ~ 5.50 METS   Final Assessment:  The patient is at low risk of complications from a moderate risk surgery.  I recommend the following additional tests: BMP, CBC, A1c - obtained today Has diagnosis of OSA, prior sleep study 2024 but never set up with CPAP. I recommend calling Sleep Center, number provided today. I recommend cardiac risk assessment with Cardiology. I recommend the following changes to medications in the perioperative setting: hold farxiga  3 days prior to surgery. You can resume this after surgery. hold your entresto  the day of surgery, you can resume this after surgery. hold your hydroxyzine , robaxin  (muscle relaxer) the night before surgery.   R hip pain - fell 1 month ago on it into dresser with subsequent pain. - R lateral hip - worse with laying on it, standing long  periods of time. - goes down leg - taking ibuprofen , muscle relaxer for it, no help  - never evaluated for it. - dull, burning pain - no groin pain - intermittent pain  - nothing makes better. Also tried heat, ice. - no numbness, tingling. Perceived weakness, no giving out. - no swelling, redness, fevers.   OBJECTIVE:   BP (!) 185/116   Pulse 87   Ht 5' 7 (1.702 m)   Wt 263 lb (119.3 kg)   LMP 08/10/2022 (Approximate)   SpO2 98%   BMI 41.19 kg/m   Gen: well appearing, in NAD Card: RRR Lungs: CTAB Ext: WWP, no edema MSK: R hip:  Inspection: symmetric, no swelling Palpation: TTP over greater trochanter, nonTTP over piriformis ROM: full AROM in flexion Strength: 5/5 Stability: no joint laxity Special Tests: negative FADIR/FABER Neurovascular: intact  ASSESSMENT/PLAN:   Trochanteric bursitis Discussed options, offered CSI, declines. Continue conservative measures.  Hyperlipidemia Recheck lipid panel  Morbid obesity with BMI of 45.0-49.9, adult (HCC) Medicaid no longer covering wegovy . Hopeful to try zepbound for OSA.  Obstructive sleep apnea Not on CPAP. Instructed to call sleep center.  Chronic combined systolic and diastolic heart failure (HCC); mildly reduced EF Euvolemic today. Has upcoming cardiac surgical risk assessment with cardiology.  Hypertension Elevated today. Did not take meds and in pain with bursitis. F/u next week, instructed to take meds before visit.   Surgical risk assessment The patient is at low risk of complications from a moderate risk surgery. Gupta risk 0.4%. Medication adjustments as above.  [name], DO

## 2024-08-08 ENCOUNTER — Other Ambulatory Visit (HOSPITAL_COMMUNITY): Admission: RE | Admit: 2024-08-08 | Source: Other Acute Inpatient Hospital

## 2024-08-08 ENCOUNTER — Encounter: Payer: Self-pay | Admitting: Obstetrics and Gynecology

## 2024-08-08 ENCOUNTER — Ambulatory Visit: Admitting: Family Medicine

## 2024-08-08 ENCOUNTER — Other Ambulatory Visit (HOSPITAL_COMMUNITY)
Admission: RE | Admit: 2024-08-08 | Discharge: 2024-08-08 | Disposition: A | Attending: Obstetrics and Gynecology | Admitting: Obstetrics and Gynecology

## 2024-08-08 ENCOUNTER — Ambulatory Visit: Admitting: Obstetrics and Gynecology

## 2024-08-08 ENCOUNTER — Encounter: Payer: Self-pay | Admitting: Family Medicine

## 2024-08-08 VITALS — BP 170/104 | HR 95 | Ht 67.0 in | Wt 265.0 lb

## 2024-08-08 VITALS — BP 185/116 | HR 87 | Ht 67.0 in | Wt 263.0 lb

## 2024-08-08 DIAGNOSIS — I10 Essential (primary) hypertension: Secondary | ICD-10-CM | POA: Diagnosis not present

## 2024-08-08 DIAGNOSIS — R7303 Prediabetes: Secondary | ICD-10-CM | POA: Diagnosis not present

## 2024-08-08 DIAGNOSIS — Z01818 Encounter for other preprocedural examination: Secondary | ICD-10-CM | POA: Diagnosis not present

## 2024-08-08 DIAGNOSIS — L299 Pruritus, unspecified: Secondary | ICD-10-CM | POA: Diagnosis not present

## 2024-08-08 DIAGNOSIS — I5042 Chronic combined systolic (congestive) and diastolic (congestive) heart failure: Secondary | ICD-10-CM | POA: Diagnosis not present

## 2024-08-08 DIAGNOSIS — G4733 Obstructive sleep apnea (adult) (pediatric): Secondary | ICD-10-CM | POA: Diagnosis not present

## 2024-08-08 DIAGNOSIS — M7060 Trochanteric bursitis, unspecified hip: Secondary | ICD-10-CM | POA: Insufficient documentation

## 2024-08-08 DIAGNOSIS — R3989 Other symptoms and signs involving the genitourinary system: Secondary | ICD-10-CM

## 2024-08-08 DIAGNOSIS — Z23 Encounter for immunization: Secondary | ICD-10-CM

## 2024-08-08 DIAGNOSIS — E782 Mixed hyperlipidemia: Secondary | ICD-10-CM | POA: Diagnosis not present

## 2024-08-08 DIAGNOSIS — M7061 Trochanteric bursitis, right hip: Secondary | ICD-10-CM | POA: Diagnosis not present

## 2024-08-08 DIAGNOSIS — Z6841 Body Mass Index (BMI) 40.0 and over, adult: Secondary | ICD-10-CM

## 2024-08-08 LAB — POCT URINE DIPSTICK
Bilirubin, UA: NEGATIVE
Glucose, UA: NEGATIVE mg/dL
Ketones, POC UA: NEGATIVE mg/dL
Leukocytes, UA: NEGATIVE
Nitrite, UA: POSITIVE — AB
POC PROTEIN,UA: NEGATIVE
Spec Grav, UA: 1.025 (ref 1.010–1.025)
Urobilinogen, UA: 0.2 U/dL
pH, UA: 6 (ref 5.0–8.0)

## 2024-08-08 MED ORDER — FLUTICASONE PROPIONATE 50 MCG/ACT NA SUSP: 1.0000 | NASAL | 1 refills | Status: DC | PRN

## 2024-08-08 MED ORDER — OXYCODONE HCL 5 MG PO TABS: 5.0000 mg | ORAL_TABLET | ORAL | 0 refills | Status: AC | PRN

## 2024-08-08 MED ORDER — NITROFURANTOIN MONOHYD MACRO 100 MG PO CAPS: 100.0000 mg | ORAL_CAPSULE | Freq: Two times a day (BID) | ORAL | 0 refills | Status: AC

## 2024-08-08 MED ORDER — HYDROXYZINE HCL 10 MG PO TABS: 10.0000 mg | ORAL_TABLET | Freq: Every evening | ORAL | 2 refills | Status: DC | PRN

## 2024-08-08 MED ORDER — ACETAMINOPHEN 500 MG PO TABS: 500.0000 mg | ORAL_TABLET | Freq: Four times a day (QID) | ORAL | 0 refills | Status: AC | PRN

## 2024-08-08 MED ORDER — PHENAZOPYRIDINE HCL 200 MG PO TABS: 200.0000 mg | ORAL_TABLET | Freq: Three times a day (TID) | ORAL | 0 refills | Status: DC | PRN

## 2024-08-08 MED ORDER — IBUPROFEN 600 MG PO TABS: 600.0000 mg | ORAL_TABLET | Freq: Four times a day (QID) | ORAL | 0 refills | Status: AC | PRN

## 2024-08-08 NOTE — Patient Instructions (Addendum)
 Start your antibiotics, we will send your urine for culture. We will message you with the results.   Please call if there are any issues.   I have sent your medications to the pharmacy.

## 2024-08-08 NOTE — Patient Instructions (Signed)
 It was great to see you!  Our plans for today:  - Medication changes prior to surgery:  - hold your farxiga  3 days prior to surgery. You can resume this after surgery.  - hold your entresto  the day of surgery, you can resume this after surgery.  - hold your hydroxyzine , robaxin  (muscle relaxer) the night before surgery.  - Call the sleep study center at [phone] to get set up with CPAP. - Let me know if you change your mind and want an injection for your hip. - Come back next week for follow up for your blood pressure. Make sure to take your medications prior to coming.   We are checking some labs today, we will release these results to your MyChart.  Take care and seek immediate care sooner if you develop any concerns.   Dr. Madelon   Hip Bursitis  Hip bursitis is the swelling of one or more of the fluid-filled sacs (bursae) in the hip joint. The hip bursae absorb shocks and prevent bones from rubbing against each other. If a bursa becomes irritated, it can fill with extra fluid and become inflamed. Hip bursitis can cause mild to moderate pain, and symptoms often come and go over time. What are the causes? This condition results from increased friction between the hip bones and the tendons around the hip joint. This condition can happen if you: Overuse your hip muscles. Injure your hip. Have weak buttocks muscles. Have bone spurs. Have an infection. In some cases, the cause may not be known. What increases the risk? You are more likely to develop this condition if: You injured your hip previously or had hip surgery. You have a medical condition, such as arthritis, gout, diabetes, or thyroid  disease. You have spine problems. You have one leg that is shorter than the other. You participate in athletic activities that include repetitive motion, like running. You participate in sports where there is a risk of injury or falling, such as football, martial arts, or skiing. What are  the signs or symptoms? Symptoms may come and go, and they often include: Pain in the hip or groin area. Pain may get worse with movement. Tenderness and swelling of the hip. In rare cases, the bursa may become infected. If this happens, you may get a fever, as well as warmth and redness in the hip area. How is this diagnosed? This condition may be diagnosed based on: Your symptoms. Your medical history. A physical exam. Imaging tests, such as: X-rays to check your bones. MRI or ultrasound to check your tendons and muscles. Bone scan. How is this treated? This condition is treated by resting, icing, applying pressure (compression), and raising (elevating) the injured area. This is called RICE treatment. In some cases, RICE treatment may not be enough to make your symptoms go away. Treatment may also include: Using crutches, a cane, or a walker to decrease the strain on your hip. Taking medicine to help with swelling and pain. Getting a shot of cortisone medicine near the affected area to reduce swelling and pain. Taking antibiotic medicines if there is an infection. Draining fluid out of the bursa to help relieve swelling and pain. Having surgery to remove a damaged or infected bursa. This is rare. Long-term treatment may include: Physical therapy exercises for strength and flexibility. Identifying the cause of your bursitis to prevent future episodes. Lifestyle changes, such as weight loss, to reduce the strain on the hip. Follow these instructions at home: Managing pain, stiffness,  and swelling     If directed, put ice on the affected area. To do this: Put ice in a plastic bag. Place a towel between your skin and the bag. Leave the ice on for 20 minutes, 2-3 times a day. Remove the ice if your skin turns bright red. This is very important. If you cannot feel pain, heat, or cold, you have a greater risk of damage to the area. Elevate your hip as much as you can without feeling  pain. To do this, put a pillow under your hips while you lie down. If directed, apply heat to the affected area as often as told by your health care provider. Use the heat source that your health care provider recommends, such as a moist heat pack or a heating pad. Place a towel between your skin and the heat source. Leave the heat on for 20-30 minutes. Remove the heat if your skin turns bright red. This is especially important if you are unable to feel pain, heat, or cold. You may have a greater risk of getting burned. Activity Do not use your hip to support your body weight until your health care provider says that you can. Use crutches, a cane, or a walker as told by your health care provider. If the affected leg is one that you use to drive, ask your health care provider if it is safe to drive. Rest and protect your hip as much as possible until your pain and swelling get better. Return to your normal activities as told by your health care provider. Ask your health care provider what activities are safe for you. Do exercises as told by your health care provider. General instructions Take over-the-counter and prescription medicines only as told by your health care provider. Gently massage and stretch your injured area as often as is comfortable. Wear compression wraps only as told by your health care provider. If one of your legs is shorter than the other, get fitted for a shoe insert or orthotic. Your health care provider or physical therapist can tell you where to find these items and what size you need. Maintain a healthy weight. Follow instructions from your health care provider for weight control. These may include dietary restrictions. Keep all follow-up visits. This is important. How is this prevented? Exercise regularly or as told by your health care provider. Wear supportive footwear that is appropriate for your sport and daily activities. Warm up and stretch before being active.  Cool down and stretch after being active. Take breaks regularly from repetitive activity. If an activity irritates your hip or causes pain, avoid the activity as much as possible. Avoid sitting down for long periods at a time. Where to find more information American Academy [employer]: orthoinfo.aaos.org Contact a health care provider if: You have a fever. You develop new symptoms. You have trouble walking or doing everyday activities. You have pain that gets worse or does not get better with medicine. You develop red skin or a feeling of warmth in your hip area. Get help right away if: You cannot move your hip. You have severe pain. You cannot control the muscles in your feet. Summary Hip bursitis is the swelling of one or more of the fluid-filled sacs (bursae) in the hip joint. Hip bursitis can cause hip or groin pain, and symptoms often come and go over time. This condition is often treated by resting, icing, applying pressure (compression), and raising (elevating) the injured area. Other treatments may be  needed. This information is not intended to replace advice given to you by your health care provider. Make sure you discuss any questions you have with your health care provider. Document Revised: 08/20/2021 Document Reviewed: 08/20/2021 Elsevier Patient Education  2024 [employer].

## 2024-08-08 NOTE — Assessment & Plan Note (Signed)
 Euvolemic today. Has upcoming cardiac surgical risk assessment with cardiology.

## 2024-08-08 NOTE — Assessment & Plan Note (Signed)
 Not on CPAP. Instructed to call sleep center.

## 2024-08-08 NOTE — Progress Notes (Unsigned)
 [facility] Pre-Operative Exam  Subjective Chief Complaint: Summer Brewer presents for a preoperative encounter.   History of Present Illness: Summer Brewer is a 53 y.o. female who presents for preoperative visit.  She is scheduled to undergo Exam under anesthesia, cystoscopy with bladder hydrodistention and biopsy on 08/23/24.  Her symptoms include bladder pain.    Urodynamics showed: Deferred  Past Medical History:  Diagnosis Date   Anemia    Anxiety    Back pain    CAD (coronary artery disease)    Cardiomyopathy (HCC) 03/2023   Echo with EF 40 to 45% global hypokinesis.   CHF (congestive heart failure) (HCC)    Eczema    Fibroid    GERD (gastroesophageal reflux disease)    Hypertension    Migraines    Post-operative state 09/30/2022   Preterm labor    Umbilical hernia    Vaginal Pap smear, abnormal      Past Surgical History:  Procedure Laterality Date   CESAREAN SECTION  2000   x2 and 2002   DILITATION & CURRETTAGE/HYSTROSCOPY WITH HYDROTHERMAL ABLATION N/A 11/19/2021   Procedure: HYSTEROSCOPY WITH HYDROTHERMAL ABLATION;  Surgeon: Alger Gong, MD;  Location: [facility];  Service: Gynecology;  Laterality: N/A;   INSERTION OF MESH  08/09/2015   Procedure: INSERTION OF MESH;  Surgeon: Donnice Lima, MD;  Location: [facility] OR;  Service: General;;   NASAL POLYP EXCISION Bilateral 2016   pt states she had polyps removed on both sides   TRANSTHORACIC ECHOCARDIOGRAM  03/29/2023   EF 40-45%, global hypokinesis, grade 1 diastolic dysfunction, mildly reduced RV function, normal aortic and mitral valves, normal right atrial pressures   TUBAL LIGATION     UMBILICAL HERNIA REPAIR  08/09/2015   Procedure: UMBILICAL HERNIA REPAIR;  Surgeon: Donnice Lima, MD;  Location: [facility] OR;  Service: General;;   VAGINAL HYSTERECTOMY Bilateral 09/30/2022   Procedure: HYSTERECTOMY VAGINAL WITH BILATERAL SALPINGECTOMY;  Surgeon: Lorence Ozell CROME, MD;  Location:  [facility] OR;  Service: Gynecology;  Laterality: Bilateral;    is allergic to latex and tizanidine .   Family History  Problem Relation Age of Onset   Hypertension Mother    Deep vein thrombosis Mother    Hypertension Father    Cancer - Prostate Father    Cancer Paternal Grandmother    Hypertension Other    Deep vein thrombosis Other    Leukemia Other    Colon cancer Neg Hx    Colon polyps Neg Hx    Esophageal cancer Neg Hx    Stomach cancer Neg Hx    Rectal cancer Neg Hx    Bladder Cancer Neg Hx    Renal cancer Neg Hx    Uterine cancer Neg Hx     Social History   Tobacco Use   Smoking status: Never    Passive exposure: Never   Smokeless tobacco: Never  Vaping Use   Vaping status: Never Used  Substance Use Topics   Alcohol use: No   Drug use: No     Review of Systems was negative for a full 10 system review except as noted in the History of Present Illness.   Current Outpatient Medications:    acetaminophen  (TYLENOL ) 500 MG tablet, Take 1 tablet (500 mg total) by mouth every 6 (six) hours as needed (pain)., Disp: 30 tablet, Rfl: 0   albuterol  (VENTOLIN  HFA) 108 (90 Base) MCG/ACT inhaler, Inhale 2 puffs into the lungs every 4 (four) hours as needed., Disp:  18 g, Rfl: 3   cetirizine  (ZYRTEC ) 10 MG tablet, TAKE 1 TABLET BY MOUTH EVERY DAY, Disp: 90 tablet, Rfl: 0   clobetasol  ointment (TEMOVATE ) 0.05 %, Apply 1 Application topically 2 (two) times daily. Apply to areas of sever eczema twice daily until clear., Disp: 60 g, Rfl: 2   dapagliflozin  propanediol (FARXIGA ) 10 MG TABS tablet, TAKE 1 TABLET BY MOUTH EVERY DAY, Disp: 90 tablet, Rfl: 3   ENTRESTO  49-51 MG, Take 1 tablet by mouth 2 (two) times daily., Disp: 180 tablet, Rfl: 0   famotidine  (PEPCID ) 40 MG tablet, TAKE 1 TABLET BY MOUTH TWICE A DAY, Disp: 60 tablet, Rfl: 5   fluticasone  (FLONASE ) 50 MCG/ACT nasal spray, Place 1 spray into both nostrils as needed., Disp: 16 g, Rfl: 1   hydrOXYzine  (ATARAX ) 10 MG tablet, Take 1  tablet (10 mg total) by mouth at bedtime as needed., Disp: 15 tablet, Rfl: 2   ibuprofen  (ADVIL ) 600 MG tablet, Take 1 tablet (600 mg total) by mouth every 6 (six) hours as needed., Disp: 30 tablet, Rfl: 0   Meth-Hyo-M Bl-Na Phos-Ph Sal (URO-MP ) 118 MG CAPS, Take 1 capsule (118 mg total) by mouth 3 (three) times daily as needed., Disp: 30 capsule, Rfl: 0   methocarbamol  (ROBAXIN ) 500 MG tablet, Take 1 tablet (500 mg total) by mouth 3 (three) times daily., Disp: 60 tablet, Rfl: 0   metoprolol  succinate (TOPROL -XL) 100 MG 24 hr tablet, Take 1 tablet (100 mg total) by mouth at bedtime. Take with or immediately following a meal., Disp: 90 tablet, Rfl: 0   mirabegron  ER (MYRBETRIQ ) 25 MG TB24 tablet, TAKE 1 TABLET (25 MG TOTAL) BY MOUTH DAILY., Disp: 30 tablet, Rfl: 5   Misc Natural Products (IBEROGAST) LIQD, Take by mouth., Disp: , Rfl:    nitrofurantoin , macrocrystal-monohydrate, (MACROBID ) 100 MG capsule, Take 1 capsule (100 mg total) by mouth 2 (two) times daily for 5 days., Disp: 10 capsule, Rfl: 0   oxyCODONE  (OXY IR/ROXICODONE ) 5 MG immediate release tablet, Take 1 tablet (5 mg total) by mouth every 4 (four) hours as needed for severe pain (pain score 7-10)., Disp: 10 tablet, Rfl: 0   oxyCODONE  (OXY IR/ROXICODONE ) 5 MG immediate release tablet, Take 1 tablet (5 mg total) by mouth every 4 (four) hours as needed for severe pain (pain score 7-10)., Disp: 5 tablet, Rfl: 0   phenazopyridine  (PYRIDIUM ) 100 MG tablet, Take 100 mg by mouth 3 (three) times daily., Disp: , Rfl:    phenazopyridine  (PYRIDIUM ) 200 MG tablet, Take 1 tablet (200 mg total) by mouth 3 (three) times daily as needed for pain., Disp: 10 tablet, Rfl: 0   phenazopyridine  (PYRIDIUM ) 200 MG tablet, Take 1 tablet (200 mg total) by mouth 3 (three) times daily as needed for pain., Disp: 10 tablet, Rfl: 0   scopolamine  (TRANSDERM-SCOP) 1 MG/3DAYS, Place 1 patch (1.5 mg total) onto the skin every 3 (three) days., Disp: 10 patch, Rfl: 5    triamcinolone  cream (KENALOG ) 0.5 %, Apply topically 2 (two) times daily as needed., Disp: 15 g, Rfl: 2   Objective Vitals:   08/08/24 1505 08/08/24 1534  BP: (!) 174/100 (!) 170/104  Pulse: 95     Gen: NAD CV: S1 S2 RRR Lungs: Clear to auscultation bilaterally Abd: soft, nontender   Previous Pelvic Exam showed: POP-Q   -2  Aa   -2                                           Ba   -6                                              C    3.5                                            Gh   4.5                                            Pb   8.5                                            tvl    -2.5                                            Ap   -2.5                                            Bp                                                        Assessment/ Plan  Assessment: The patient is a 53 y.o. year old scheduled to undergo Exam under anesthesia, cystoscopy with bladder hydrodistention and biopsy. Verbal consent was obtained for these procedures.  Plan: General Surgical Consent: The patient has previously been counseled on alternative treatments, and the decision by the patient and provider was to proceed with the procedure listed above.  For all procedures, there are risks of bleeding, infection, damage to surrounding organs including but not limited to bowel, bladder, blood vessels, ureters and nerves, and need for further surgery if an injury were to occur. These risks are all low with minimally invasive surgery.   There are risks of numbness and weakness at any body site or buttock/rectal pain.  It is possible that baseline pain can be worsened by surgery, either with or without mesh. If surgery is vaginal, there is also a low risk of possible conversion to laparoscopy or open abdominal incision where indicated. Very rare risks include blood transfusion, blood clot, heart attack, pneumonia, or death.   There is also  a risk of short-term postoperative urinary retention with need to use a catheter. About half of patients need to go home from surgery with a catheter, which is then later removed in  the office. The risk of long-term need for a catheter is very low. There is also a risk of worsening of overactive bladder.   We discussed consent for blood products. Risks for blood transfusion include allergic reactions, other reactions that can affect different body organs and managed accordingly, transmission of infectious diseases such as HIV or Hepatitis. However, the blood is screened. Patient consents for blood products.  Pre-operative instructions:  She was instructed to not take Aspirin/NSAIDs x 7days prior to surgery.  Antibiotic prophylaxis was ordered as indicated.  Catheter use: Patient will go home with foley if needed after post-operative voiding trial.  Post-operative instructions:  She was provided with specific post-operative instructions, including precautions and signs/symptoms for which we would recommend contacting us , in addition to daytime and after-hours contact phone numbers. This was provided on a handout.   Post-operative medications: Prescriptions for motrin , tylenol , and oxycodone  were sent to her pharmacy. Discussed using ibuprofen  and tylenol  on a schedule to limit use of narcotics.   Laboratory testing:  We will check labs: As requested by anesthesia Lab Results  Component Value Date   COLORU yellow 08/08/2024   CLARITYU clear 08/08/2024   GLUCOSEUR negative 08/08/2024   BILIRUBINUR negative 08/08/2024   KETONESU Negative 10/09/2022   SPECGRAV 1.025 08/08/2024   RBCUR small (A) 08/08/2024   PHUR 6.0 08/08/2024   PROTEINUR >300 (A) 05/10/2024   UROBILINOGEN 0.2 08/08/2024   LEUKOCYTESUR Negative 08/08/2024   Urine today to be sent for culture and micro  Preoperative clearance:  She does not require surgical clearance.    Post-operative follow-up:  A post-operative  appointment will be made for 6 weeks from the date of surgery. If she needs a post-operative nurse visit for a voiding trial, that will be set up after she leaves the hospital.    Patient will call the clinic or use MyChart should anything change or any new issues arise.   Rodrigues Urbanek G Minaal Struckman, NP

## 2024-08-08 NOTE — Assessment & Plan Note (Signed)
 Recheck lipid panel

## 2024-08-08 NOTE — Assessment & Plan Note (Signed)
 Elevated today. Did not take meds and in pain with bursitis. F/u next week, instructed to take meds before visit.

## 2024-08-08 NOTE — Assessment & Plan Note (Signed)
 Discussed options, offered CSI, declines. Continue conservative measures.

## 2024-08-08 NOTE — Assessment & Plan Note (Addendum)
 Medicaid no longer covering wegovy . Hopeful to try zepbound for OSA.

## 2024-08-09 ENCOUNTER — Ambulatory Visit: Payer: Self-pay | Admitting: Family Medicine

## 2024-08-09 ENCOUNTER — Ambulatory Visit: Admitting: Cardiology

## 2024-08-09 ENCOUNTER — Encounter: Payer: Self-pay | Admitting: Obstetrics and Gynecology

## 2024-08-09 LAB — LIPID PANEL
Chol/HDL Ratio: 3.7 ratio (ref 0.0–4.4)
Cholesterol, Total: 178 mg/dL (ref 100–199)
HDL: 48 mg/dL (ref >39)
LDL Chol Calc (NIH): 105 mg/dL — ABNORMAL HIGH (ref 0–99)
Triglycerides: 139 mg/dL (ref 0–149)
VLDL Cholesterol Cal: 25 mg/dL (ref 5–40)

## 2024-08-09 LAB — CBC
Hematocrit: 37.7 % (ref 34.0–46.6)
Hemoglobin: 11.8 g/dL (ref 11.1–15.9)
MCH: 27.1 pg (ref 26.6–33.0)
MCHC: 31.3 g/dL — ABNORMAL LOW (ref 31.5–35.7)
MCV: 87 fL (ref 79–97)
Platelets: 156 x10E3/uL (ref 150–450)
RBC: 4.36 x10E6/uL (ref 3.77–5.28)
RDW: 13.2 % (ref 11.7–15.4)
WBC: 4 x10E3/uL (ref 3.4–10.8)

## 2024-08-09 LAB — BASIC METABOLIC PANEL WITH GFR
BUN/Creatinine Ratio: 16 (ref 9–23)
BUN: 16 mg/dL (ref 6–24)
CO2: 24 mmol/L (ref 20–29)
Calcium: 9.3 mg/dL (ref 8.7–10.2)
Chloride: 104 mmol/L (ref 96–106)
Creatinine, Ser: 0.97 mg/dL (ref 0.57–1.00)
Glucose: 84 mg/dL (ref 70–99)
Potassium: 3.6 mmol/L (ref 3.5–5.2)
Sodium: 143 mmol/L (ref 134–144)
eGFR: 71 mL/min/1.73 (ref >59)

## 2024-08-09 LAB — HEMOGLOBIN A1C
Est. average glucose Bld gHb Est-mCnc: 111 mg/dL
Hgb A1c MFr Bld: 5.5 % (ref 4.8–5.6)

## 2024-08-09 NOTE — H&P (Cosign Needed)
 [facility] H&P  Subjective Chief Complaint: WM FRUCHTER presents for a preoperative encounter.   History of Present Illness: Summer Brewer is a 53 y.o. female who presents for preoperative visit.  She is scheduled to undergo Exam under anesthesia, cystoscopy with bladder hydrodistention and biopsy on 08/23/24.  Her symptoms include bladder pain.    Urodynamics showed: Deferred  Past Medical History:  Diagnosis Date   Anemia    Anxiety    Back pain    CAD (coronary artery disease)    Cardiomyopathy (HCC) 03/2023   Echo with EF 40 to 45% global hypokinesis.   CHF (congestive heart failure) (HCC)    Eczema    Fibroid    GERD (gastroesophageal reflux disease)    Hypertension    Migraines    Post-operative state 09/30/2022   Preterm labor    Umbilical hernia    Vaginal Pap smear, abnormal      Past Surgical History:  Procedure Laterality Date   CESAREAN SECTION  2000   x2 and 2002   DILITATION & CURRETTAGE/HYSTROSCOPY WITH HYDROTHERMAL ABLATION N/A 11/19/2021   Procedure: HYSTEROSCOPY WITH HYDROTHERMAL ABLATION;  Surgeon: Alger Gong, MD;  Location: [facility];  Service: Gynecology;  Laterality: N/A;   INSERTION OF MESH  08/09/2015   Procedure: INSERTION OF MESH;  Surgeon: Donnice Lima, MD;  Location: [facility] OR;  Service: General;;   NASAL POLYP EXCISION Bilateral 2016   pt states she had polyps removed on both sides   TRANSTHORACIC ECHOCARDIOGRAM  03/29/2023   EF 40-45%, global hypokinesis, grade 1 diastolic dysfunction, mildly reduced RV function, normal aortic and mitral valves, normal right atrial pressures   TUBAL LIGATION     UMBILICAL HERNIA REPAIR  08/09/2015   Procedure: UMBILICAL HERNIA REPAIR;  Surgeon: Donnice Lima, MD;  Location: [facility];  Service: General;;   VAGINAL HYSTERECTOMY Bilateral 09/30/2022   Procedure: HYSTERECTOMY VAGINAL WITH BILATERAL SALPINGECTOMY;  Surgeon: Lorence Ozell CROME, MD;  Location: [facility] OR;  Service:  Gynecology;  Laterality: Bilateral;    is allergic to latex and tizanidine .   Family History  Problem Relation Age of Onset   Hypertension Mother    Deep vein thrombosis Mother    Hypertension Father    Cancer - Prostate Father    Cancer Paternal Grandmother    Hypertension Other    Deep vein thrombosis Other    Leukemia Other    Colon cancer Neg Hx    Colon polyps Neg Hx    Esophageal cancer Neg Hx    Stomach cancer Neg Hx    Rectal cancer Neg Hx    Bladder Cancer Neg Hx    Renal cancer Neg Hx    Uterine cancer Neg Hx     Social History   Tobacco Use   Smoking status: Never    Passive exposure: Never   Smokeless tobacco: Never  Vaping Use   Vaping status: Never Used  Substance Use Topics   Alcohol use: No   Drug use: No     Review of Systems was negative for a full 10 system review except as noted in the History of Present Illness.  No current facility-administered medications for this encounter.  Current Outpatient Medications:    acetaminophen  (TYLENOL ) 500 MG tablet, Take 1 tablet (500 mg total) by mouth every 6 (six) hours as needed (pain)., Disp: 30 tablet, Rfl: 0   albuterol  (VENTOLIN  HFA) 108 (90 Base) MCG/ACT inhaler, Inhale 2 puffs into the lungs every  4 (four) hours as needed., Disp: 18 g, Rfl: 3   cetirizine  (ZYRTEC ) 10 MG tablet, TAKE 1 TABLET BY MOUTH EVERY DAY, Disp: 90 tablet, Rfl: 0   clobetasol  ointment (TEMOVATE ) 0.05 %, Apply 1 Application topically 2 (two) times daily. Apply to areas of sever eczema twice daily until clear., Disp: 60 g, Rfl: 2   dapagliflozin  propanediol (FARXIGA ) 10 MG TABS tablet, TAKE 1 TABLET BY MOUTH EVERY DAY, Disp: 90 tablet, Rfl: 3   ENTRESTO  49-51 MG, Take 1 tablet by mouth 2 (two) times daily., Disp: 180 tablet, Rfl: 0   famotidine  (PEPCID ) 40 MG tablet, TAKE 1 TABLET BY MOUTH TWICE A DAY, Disp: 60 tablet, Rfl: 5   fluticasone  (FLONASE ) 50 MCG/ACT nasal spray, Place 1 spray into both nostrils as needed., Disp: 16 g,  Rfl: 1   hydrOXYzine  (ATARAX ) 10 MG tablet, Take 1 tablet (10 mg total) by mouth at bedtime as needed., Disp: 15 tablet, Rfl: 2   ibuprofen  (ADVIL ) 600 MG tablet, Take 1 tablet (600 mg total) by mouth every 6 (six) hours as needed., Disp: 30 tablet, Rfl: 0   Meth-Hyo-M Bl-Na Phos-Ph Sal (URO-MP ) 118 MG CAPS, Take 1 capsule (118 mg total) by mouth 3 (three) times daily as needed., Disp: 30 capsule, Rfl: 0   methocarbamol  (ROBAXIN ) 500 MG tablet, Take 1 tablet (500 mg total) by mouth 3 (three) times daily., Disp: 60 tablet, Rfl: 0   metoprolol  succinate (TOPROL -XL) 100 MG 24 hr tablet, Take 1 tablet (100 mg total) by mouth at bedtime. Take with or immediately following a meal., Disp: 90 tablet, Rfl: 0   mirabegron  ER (MYRBETRIQ ) 25 MG TB24 tablet, TAKE 1 TABLET (25 MG TOTAL) BY MOUTH DAILY., Disp: 30 tablet, Rfl: 5   Misc Natural Products (IBEROGAST) LIQD, Take by mouth., Disp: , Rfl:    nitrofurantoin , macrocrystal-monohydrate, (MACROBID ) 100 MG capsule, Take 1 capsule (100 mg total) by mouth 2 (two) times daily for 5 days., Disp: 10 capsule, Rfl: 0   oxyCODONE  (OXY IR/ROXICODONE ) 5 MG immediate release tablet, Take 1 tablet (5 mg total) by mouth every 4 (four) hours as needed for severe pain (pain score 7-10)., Disp: 10 tablet, Rfl: 0   oxyCODONE  (OXY IR/ROXICODONE ) 5 MG immediate release tablet, Take 1 tablet (5 mg total) by mouth every 4 (four) hours as needed for severe pain (pain score 7-10)., Disp: 5 tablet, Rfl: 0   phenazopyridine  (PYRIDIUM ) 100 MG tablet, Take 100 mg by mouth 3 (three) times daily., Disp: , Rfl:    phenazopyridine  (PYRIDIUM ) 200 MG tablet, Take 1 tablet (200 mg total) by mouth 3 (three) times daily as needed for pain., Disp: 10 tablet, Rfl: 0   phenazopyridine  (PYRIDIUM ) 200 MG tablet, Take 1 tablet (200 mg total) by mouth 3 (three) times daily as needed for pain., Disp: 10 tablet, Rfl: 0   scopolamine  (TRANSDERM-SCOP) 1 MG/3DAYS, Place 1 patch (1.5 mg total) onto the skin  every 3 (three) days., Disp: 10 patch, Rfl: 5   triamcinolone  cream (KENALOG ) 0.5 %, Apply topically 2 (two) times daily as needed., Disp: 15 g, Rfl: 2   Objective There were no vitals filed for this visit.   Gen: NAD CV: S1 S2 RRR Lungs: Clear to auscultation bilaterally Abd: soft, nontender   Previous Pelvic Exam showed: POP-Q   -2  Aa   -2                                           Ba   -6                                              C    3.5                                            Gh   4.5                                            Pb   8.5                                            tvl    -2.5                                            Ap   -2.5                                            Bp                                                        Assessment/ Plan  Assessment: The patient is a 53 y.o. year old scheduled to undergo Exam under anesthesia, cystoscopy with bladder hydrodistention and biopsy. Verbal consent was obtained for these procedures.  Per review with Dr. Madelon low risk of complications with Charlanne risk 0.4%.  Recommended medication adjustments:  1) hold farxiga  3 days prior to surgery. Resume after surgery.  2) hold entresto  the day of surgery, resume after surgery.  3) hold hydroxyzine , robaxin  (muscle relaxer) the night before surgery.  - recommend cardiac risk assessment by Cardiology.  - OSA not on CPAP, last sleep study 2024. [facility]

## 2024-08-10 ENCOUNTER — Ambulatory Visit: Payer: Self-pay | Admitting: Obstetrics and Gynecology

## 2024-08-10 ENCOUNTER — Inpatient Hospital Stay (HOSPITAL_COMMUNITY)
Admission: RE | Admit: 2024-08-10 | Discharge: 2024-08-10 | Disposition: A | Source: Ambulatory Visit | Attending: Neurology

## 2024-08-10 VITALS — BP 162/89 | HR 69 | Temp 98.0°F | Resp 17

## 2024-08-10 DIAGNOSIS — G43119 Migraine with aura, intractable, without status migrainosus: Secondary | ICD-10-CM | POA: Insufficient documentation

## 2024-08-10 LAB — URINE CULTURE: Culture: >=100000 — AB

## 2024-08-10 MED ORDER — SODIUM CHLORIDE 0.9 % IV SOLN
300.0000 mg | Freq: Once | INTRAVENOUS | Status: AC
  Administered 2024-08-10: 300 mg via INTRAVENOUS
  Filled 2024-08-10: qty 3

## 2024-08-10 MED ORDER — SODIUM CHLORIDE 0.9% FLUSH: 20.0000 mL | Freq: Once | INTRAVENOUS | Status: DC

## 2024-08-11 ENCOUNTER — Encounter: Payer: Self-pay | Admitting: Physician Assistant

## 2024-08-11 ENCOUNTER — Other Ambulatory Visit: Payer: Self-pay | Admitting: Physician Assistant

## 2024-08-11 ENCOUNTER — Ambulatory Visit: Attending: Physician Assistant | Admitting: Physician Assistant

## 2024-08-11 ENCOUNTER — Encounter: Payer: Self-pay | Admitting: Obstetrics

## 2024-08-11 ENCOUNTER — Telehealth: Payer: Self-pay | Admitting: Pharmacy Technician

## 2024-08-11 VITALS — BP 156/98 | HR 68 | Ht 67.0 in | Wt 261.4 lb

## 2024-08-11 DIAGNOSIS — Z79899 Other long term (current) drug therapy: Secondary | ICD-10-CM | POA: Diagnosis not present

## 2024-08-11 DIAGNOSIS — G4733 Obstructive sleep apnea (adult) (pediatric): Secondary | ICD-10-CM | POA: Insufficient documentation

## 2024-08-11 DIAGNOSIS — Z01810 Encounter for preprocedural cardiovascular examination: Secondary | ICD-10-CM | POA: Diagnosis not present

## 2024-08-11 DIAGNOSIS — I43 Cardiomyopathy in diseases classified elsewhere: Secondary | ICD-10-CM | POA: Diagnosis not present

## 2024-08-11 DIAGNOSIS — I10 Essential (primary) hypertension: Secondary | ICD-10-CM | POA: Diagnosis not present

## 2024-08-11 DIAGNOSIS — E782 Mixed hyperlipidemia: Secondary | ICD-10-CM

## 2024-08-11 DIAGNOSIS — I5042 Chronic combined systolic (congestive) and diastolic (congestive) heart failure: Secondary | ICD-10-CM

## 2024-08-11 DIAGNOSIS — I110 Hypertensive heart disease with heart failure: Secondary | ICD-10-CM | POA: Diagnosis not present

## 2024-08-11 MED ORDER — SPIRONOLACTONE 25 MG PO TABS: 25.0000 mg | ORAL_TABLET | Freq: Every day | ORAL | 3 refills | Status: AC

## 2024-08-11 MED ORDER — SACUBITRIL-VALSARTAN 97-103 MG PO TABS: 1.0000 | ORAL_TABLET | Freq: Two times a day (BID) | ORAL | 2 refills | Status: AC

## 2024-08-11 NOTE — Progress Notes (Unsigned)
 Cardiology Office Note   Date:  08/11/2024  ID:  Summer Brewer, DOB 1970-11-14, MRN [id number] PCP: Summer Donald CHRISTELLA, DO  [facility] Providers Cardiologist:  Alm Clay, MD     History of Present Illness Summer Brewer is a 53 y.o. female with past medical history of chronic combined systolic and diastolic heart failure, NICM, HTN, HLD, OSA and morbid obesity.  Patient established with cardiology service in October 2024 for new onset of heart failure.  This started after she was evaluated for dizziness and lightheadedness by her PCP.  Echocardiogram ordered by primary care provider in July 2024 showed EF 40 to 45%, global hypokinesis, grade 1 DD, mildly reduced RV function.  She was started on metoprolol  and Jardiance  by her PCP.  Her monitor performed in August 2024 showed sinus rhythm with rare PAC and rare PVCs, no significant arrhythmia.  She was seen by Dr. Clay in October 2024 and was started on spironolactone  and valsartan .  Coronary CT performed on 06/19/2023 showed mild nonobstructive CAD with 25 to 49% plaque in the LAD, total plaque volume 158 which placed the patient at 87th percentile for age and sex matched control.  She used to be on Wegovy  and is currently in the process of getting approved for Zepbound.  She was evaluated in January for palpitation. Valsartan  was switched to Entresto  at the time.  Lipid panel obtained during office visit showed LDL of 138, triglyceride 186, total cholesterol 207.  Crestor  10 mg daily was added.  Subsequent repeat heart monitor in February 2025 showed single 4 beat run of nonsustained VT lasting 2.2 seconds, no sustained arrhythmia.  No significant finding to explain the palpitation.  Patient was last seen by Dr. Clay in February, Entresto  was increased to 49-51 mg twice a day at the time.  Patient presents today for 37-month follow-up.  She appears to be euvolemic on exam.  She denies any chest pain or significant shortness of  breath.  She continues to feel fatigued.  For some reason, she still have not received her CPAP machine.  Her PCP is  working with her to obtain her CPAP machine.  She appears to be euvolemic on exam.  Blood pressure continues to be quite elevated.  Even on manual recheck by myself, blood pressure remains elevated at 164/102.  I will increase her Entresto  to 97-103 mg twice a day.  For some reason, her spironolactone  was removed in September as it was mistakenly thought that spironolactone  was a PRN medication.  I will restart her spironolactone  at 25 mg daily.  I suspect primary reason she has nonischemic cardiomyopathy was due to years of uncontrolled high blood pressure.  She says she has high blood pressure issue dating back 30 years now.  I plan to set her up with our clinical pharmacist in their hypertension clinic in 2 weeks.  Once her blood pressure is under better control, we can consider repeat echocardiogram on the next visit.  She can follow-up with Dr. Clay in 4 months.  She has upcoming cystoscopy procedure.  She will be low risk candidate proceeding with a low risk procedure.  She is okay to hold Farxiga  for 3 days prior to the procedure.  ROS:   She denies chest pain, palpitations, dyspnea, pnd, orthopnea, n, v, dizziness, syncope, edema, weight gain, or early satiety. All other systems reviewed and are otherwise negative except as noted above.    Studies Reviewed EKG Interpretation Date/Time:  Thursday August 11 2024  08:28:18 EST Ventricular Rate:  68 PR Interval:  168 QRS Duration:  82 QT Interval:  420 QTC Calculation: 446 R Axis:   10  Text Interpretation: Normal sinus rhythm Nonspecific T wave abnormality When compared with ECG of 15-Sep-2023 13:34, Minimal criteria for Inferior infarct are no longer Present Nonspecific T wave abnormality, improved in Lateral leads Confirmed by Janene Boer [id number]) on 08/11/2024 8:42:13 AM     Risk Assessment/Calculations           Physical Exam VS:  BP (!) 156/98 (BP Location: Right Arm, Patient Position: Sitting, Cuff Size: Large)   Pulse 68   Ht 5' 7 (1.702 m)   Wt 261 lb 6.4 oz (118.6 kg)   LMP 08/10/2022 (Approximate)   SpO2 98%   BMI 40.94 kg/m        Wt Readings from Last 3 Encounters:  08/11/24 261 lb 6.4 oz (118.6 kg)  08/08/24 265 lb (120.2 kg)  08/08/24 263 lb (119.3 kg)    GEN: Well nourished, well developed in no acute distress NECK: No JVD; No carotid bruits CARDIAC: RRR, no murmurs, rubs, gallops RESPIRATORY:  Clear to auscultation without rales, wheezing or rhonchi  ABDOMEN: Soft, non-tender, non-distended EXTREMITIES:  No edema; No deformity   ASSESSMENT AND PLAN  Preoperative clearance: Upcoming cystoscopy procedure.  She is a low risk candidate for the upcoming low risk procedure.  Hypertensive cardiomyopathy: Blood pressure remained quite elevated.  Uptitrate Entresto  to 97-103 mg twice a day.  Restart spironolactone .  Hypertension: Blood pressure remained quite elevated.  I will uptitrate Entresto  to 97-103 mg twice a day and restart her spironolactone  25 mg daily.  Referred to hypertension clinic to further uptitrate antihypertensive medication.  Obstructive sleep apnea: She is in the process of obtaining CPAP machine.       Dispo: Follow-up with Dr. Anner in 51-month  Signed, Arshia Rondon, [place]

## 2024-08-11 NOTE — Telephone Encounter (Addendum)
 Faxed appeal for zepbound to [phone]  [id number]

## 2024-08-11 NOTE — Patient Instructions (Signed)
 Medication Instructions:  INCREASE ENTRESTO  TO 97-103 MG TWICE A DAY  RESTART SPIRONOLACTONE  25 MG DAILY *If you need a refill on your cardiac medications before your next appointment, please call your pharmacy*  Lab Work: BMP IN 1-2 WEEKS If you have labs (blood work) drawn today and your tests are completely normal, you will receive your results only by: MyChart Message (if you have MyChart) OR A paper copy in the mail If you have any lab test that is abnormal or we need to change your treatment, we will call you to review the results.  Testing/Procedures: NO TESTING  Follow-Up: At [facility], you and your health needs are our priority.  As part of our continuing mission to provide you with exceptional heart care, our providers are all part of one team.  This team includes your primary Cardiologist (physician) and Advanced Practice Providers or APPs (Physician Assistants and Nurse Practitioners) who all work together to provide you with the care you need, when you need it.  Your next appointment:   4 month(s)  Provider:   Alm Clay, MD     Other Instructions You have been referred to Pharm D for Hypertension - Need appointment in 2 weeks   CLEARED FOR PROCEDURE. OK TO HOLD FARXIGA  4 DAYS PRIOR.

## 2024-08-14 NOTE — Progress Notes (Unsigned)
   SUBJECTIVE:   CHIEF COMPLAINT / HPI:   Discussed the use of AI scribe software for clinical note transcription with the patient, who gave verbal consent to proceed.  History of Present Illness Summer Brewer is a 53 year old female with hypertension and hyperlipidemia who presents with elevated BP and persistent right hip pain.  Right hip pain - Persistent dull pain over the lateral right hip radiating down the leg - Ibuprofen  and prior OTC treatment have not relieved symptoms - discussed possible CSI last week however was hesitant, now willing to try.  Hypertension - Home blood pressure readings in the 180s SBP - Recently started spironolactone  and increase in entresto  per cardiology last week. - Unable to increase Entresto  dose as planned due to a prescription issue. Has still been taking 1 tab BID.  Hyperlipidemia and statin intolerance - Not currently taking any cholesterol-lowering medication - Discontinued statins due to muscle soreness - Recent laboratory results showed elevated cholesterol - Mother and aunt also discontinued statins due to muscle soreness   OBJECTIVE:   BP (!) 142/90   Pulse 87   Ht 5' 7 (1.702 m)   Wt 261 lb 12.8 oz (118.8 kg)   LMP 08/10/2022 (Approximate)   SpO2 99%   BMI 41.00 kg/m   Gen: well appearing, in NAD Card: RRR Lungs: CTAB Ext: WWP, no edema  INJECTION: Patient was given informed consent, signed copy in the chart. Appropriate time out was taken. Area prepped and draped in usual sterile fashion. 1 cc of methylprednisolone  40 mg/ml plus  5 cc of 1% lidocaine  without epinephrine  was injected into the trochanteric bursa using a(n) medial approach. The patient tolerated the procedure well. There were no complications. Post procedure instructions were given.   ASSESSMENT/PLAN:   Trochanteric bursitis Persistent dull pain on outside lateral right hip. Previous treatments ineffective. - Administered steroid injection for right hip  pain. Tolerated well. Post procedure instructions given.  Hypertension Remains elevated, increase entresto  to 2 tabs BID as previously instructed. F/u next week for recheck. Will obtain labs then.  Hyperlipidemia With statin intolerance. Trial zetia . Recheck labs 6 wks.   General health maintenance PCV20 received today.   Summer CHRISTELLA Lai, DO

## 2024-08-15 ENCOUNTER — Encounter (HOSPITAL_COMMUNITY): Payer: Self-pay | Admitting: Obstetrics

## 2024-08-15 ENCOUNTER — Ambulatory Visit: Admitting: Family Medicine

## 2024-08-15 ENCOUNTER — Encounter: Payer: Self-pay | Admitting: Family Medicine

## 2024-08-15 VITALS — BP 142/90 | HR 87 | Ht 67.0 in | Wt 261.8 lb

## 2024-08-15 DIAGNOSIS — I10 Essential (primary) hypertension: Secondary | ICD-10-CM | POA: Diagnosis not present

## 2024-08-15 DIAGNOSIS — G720 Drug-induced myopathy: Secondary | ICD-10-CM | POA: Diagnosis not present

## 2024-08-15 DIAGNOSIS — M7061 Trochanteric bursitis, right hip: Secondary | ICD-10-CM | POA: Diagnosis not present

## 2024-08-15 DIAGNOSIS — Z23 Encounter for immunization: Secondary | ICD-10-CM

## 2024-08-15 DIAGNOSIS — E782 Mixed hyperlipidemia: Secondary | ICD-10-CM | POA: Diagnosis not present

## 2024-08-15 MED ORDER — EZETIMIBE 10 MG PO TABS: 10.0000 mg | ORAL_TABLET | Freq: Every day | ORAL | 3 refills | Status: AC

## 2024-08-15 NOTE — Patient Instructions (Signed)
 It was great to see you!  Our plans for today:  - Start zetia  for your cholesterol. - Take 2 pills of your entresto  twice daily until you get your new prescription. Come back next week for blood pressure recheck.  - We gave you a steroid shot for your trochanteric bursitis. If you notice any redness, marked swelling, discharge from the area or fevers, come back to see us .   Take care and seek immediate care sooner if you develop any concerns.   Dr. Constance Whittle

## 2024-08-15 NOTE — Assessment & Plan Note (Addendum)
 Persistent dull pain on outside lateral right hip. Previous treatments ineffective. - Administered steroid injection for right hip pain. Tolerated well. Post procedure instructions given.

## 2024-08-15 NOTE — Assessment & Plan Note (Signed)
 Remains elevated, increase entresto  to 2 tabs BID as previously instructed. F/u next week for recheck. Will obtain labs then.

## 2024-08-15 NOTE — Assessment & Plan Note (Signed)
 With statin intolerance. Trial zetia . Recheck labs 6 wks.

## 2024-08-15 NOTE — Progress Notes (Signed)
 Cardiac clearance dated 08/11/24 in Epic by Summer Brewer, Summer Brewer    Spoke w/ via phone for pre-op interview--- Summer Brewer needs dos----  BMP per anesthesia.        Lab results------Current EKG in Epic dated 08/11/24. COVID test -----patient states asymptomatic no test needed Arrive at -------1130 NPO after MN NO Solid Food.  Clear liquids from MN until---1030 Pre-Surgery Ensure or G2:  Med rec completed Medications to take morning of surgery -----Bring Albuterol  inhaler, Pepcid  and Entresto  Diabetic medication -----  GLP1 agonist last dose: GLP1 instructions:  Patient instructed no nail polish to be worn day of surgery Patient instructed to bring photo id and insurance card day of surgery Patient aware to have Driver (ride ) / caregiver    for 24 hours after surgery - Daughter Summer Brewer Patient Special Instructions ----- Pre-Op special Instructions -----  Patient verbalized understanding of instructions that were given at this phone interview. Patient denies chest pain, sob, fever, cough at the interview.

## 2024-08-16 ENCOUNTER — Encounter: Payer: Self-pay | Admitting: Obstetrics

## 2024-08-16 ENCOUNTER — Other Ambulatory Visit (HOSPITAL_COMMUNITY): Payer: Self-pay

## 2024-08-16 MED ORDER — METHYLPREDNISOLONE ACETATE 40 MG/ML IJ SUSP
40.0000 mg | Freq: Once | INTRAMUSCULAR | Status: AC
  Administered 2024-08-15: 40 mg via INTRAMUSCULAR

## 2024-08-16 NOTE — Telephone Encounter (Signed)
 Pt of Dr. Anner. Please advise on this RX request

## 2024-08-16 NOTE — Telephone Encounter (Signed)
 I called insurance and they verified they received the appeal on 08/11/24 and turnaround time is 09/10/24

## 2024-08-16 NOTE — Addendum Note (Signed)
 Addended by: MARDELLE PASTOR on: 08/16/2024 09:51 AM   Modules accepted: Orders

## 2024-08-17 ENCOUNTER — Other Ambulatory Visit: Payer: Self-pay | Admitting: Family Medicine

## 2024-08-17 DIAGNOSIS — L299 Pruritus, unspecified: Secondary | ICD-10-CM

## 2024-08-18 ENCOUNTER — Other Ambulatory Visit (HOSPITAL_COMMUNITY): Payer: Self-pay

## 2024-08-19 NOTE — Telephone Encounter (Signed)
° °  Hi, insurance appeal is asking for this. It is scanned in media. Please let me know when you have that record and I can send to insurance. Thank you

## 2024-08-21 NOTE — Progress Notes (Unsigned)
° °  SUBJECTIVE:   CHIEF COMPLAINT / HPI:   Discussed the use of AI scribe software for clinical note transcription with the patient, who gave verbal consent to proceed.  History of Present Illness Summer Brewer is a 53 year old female with hypertension who presents with elevated blood pressure readings.  Hypertension and blood pressure management - Fluctuating blood pressure with recent home reading of 155/97 mmHg, last night was 180s SBP - Home systolic blood pressure values range from 147 to 185 mmHg; diastolic values from 83 to 97 mmHg - Current antihypertensive regimen includes Entresto  (recently increased dose), metoprolol  twice daily, and spironolactone  - Farxiga  discontinued 4 days ago for upcoming surgery; concerned this may be affecting blood pressure control - No headache, chest pain, or shortness of breath  Lateral hip pain - Persistent leg and hip pain, worse when lying on her side - Pain disrupts sleep and requires frequent position changes at night - Prior injection provided relief for only 2 to 3 days  STI testing - Status post hysterectomy last year; no menstrual periods since - No condom use - No new sexual partners recently - partner travels for work, wants retested for STIs. Declines GC/CT/trich testing and vaginal exam. - denies abnormal discharge, ulcers, rashes   OBJECTIVE:   BP (!) 142/96   Pulse 66   Wt 254 lb 9.6 oz (115.5 kg)   LMP 08/10/2022   SpO2 92%   BMI 39.88 kg/m   Gen: well appearing, in NAD Card: RRR Lungs: CTAB Ext: WWP, no edema  ASSESSMENT/PLAN:   Assessment & Plan Primary hypertension Remains elevated though improved on recheck. Start amlodipine . Has Cardiology  f/u next week, see me 1 month. Labs today. Screening examination for STI Labs obtained. Counseled on condom use. S/p hysterectomy. Trochanteric bursitis of right hip Fairly classic symptoms previously however persistent with some relief provided by injection. Given  surgery tomorrow will plan to reevaluate after surgery.  Per chart review, also had some lower leg pain with questionable nerve origin though gabapentin , naproxen  previously no relief. Saw Sports Med who recommended Neuro eval for possible EMG, don't see where this was done. Does see Neuro for migraine.  Plan to reevaluate after surgery with possible referral to Neuro pending eval.     Summer CHRISTELLA Lai, DO

## 2024-08-22 ENCOUNTER — Encounter: Payer: Self-pay | Admitting: Family Medicine

## 2024-08-22 ENCOUNTER — Ambulatory Visit: Admitting: Family Medicine

## 2024-08-22 VITALS — BP 142/96 | HR 66 | Wt 254.6 lb

## 2024-08-22 DIAGNOSIS — M7061 Trochanteric bursitis, right hip: Secondary | ICD-10-CM

## 2024-08-22 DIAGNOSIS — I10 Essential (primary) hypertension: Secondary | ICD-10-CM

## 2024-08-22 DIAGNOSIS — Z113 Encounter for screening for infections with a predominantly sexual mode of transmission: Secondary | ICD-10-CM | POA: Diagnosis not present

## 2024-08-22 MED ORDER — AMLODIPINE BESYLATE 5 MG PO TABS: 5.0000 mg | ORAL_TABLET | Freq: Every day | ORAL | 3 refills | Status: AC

## 2024-08-22 NOTE — Patient Instructions (Addendum)
 It was great to see you!  Our plans for today:  - Start taking amlodipine  5mg .  - Come back to see me in 1 month.   We are checking labs, we will release these results to your MyChart.  Take care and seek immediate care sooner if you develop any concerns.   Dr. Shaan Rhoads

## 2024-08-22 NOTE — Assessment & Plan Note (Addendum)
 Fairly classic symptoms previously however persistent with some relief provided by injection. Given surgery tomorrow will plan to reevaluate after surgery.  Per chart review, also had some lower leg pain with questionable nerve origin though gabapentin , naproxen  previously no relief. Saw Sports Med who recommended Neuro eval for possible EMG, don't see where this was done. Does see Neuro for migraine.  Plan to reevaluate after surgery with possible referral to Neuro pending eval.

## 2024-08-22 NOTE — Assessment & Plan Note (Addendum)
 Remains elevated though improved on recheck. Start amlodipine . Has Cardiology  f/u next week, see me 1 month. Labs today.

## 2024-08-23 ENCOUNTER — Ambulatory Visit: Payer: Self-pay | Admitting: Family Medicine

## 2024-08-23 ENCOUNTER — Encounter (HOSPITAL_COMMUNITY): Payer: Self-pay | Admitting: Obstetrics

## 2024-08-23 ENCOUNTER — Ambulatory Visit (HOSPITAL_BASED_OUTPATIENT_CLINIC_OR_DEPARTMENT_OTHER): Payer: Self-pay | Admitting: Anesthesiology

## 2024-08-23 ENCOUNTER — Encounter: Admission: RE | Disposition: A | Payer: Self-pay | Attending: Obstetrics

## 2024-08-23 ENCOUNTER — Ambulatory Visit (HOSPITAL_COMMUNITY): Payer: Self-pay | Admitting: Anesthesiology

## 2024-08-23 ENCOUNTER — Ambulatory Visit (HOSPITAL_COMMUNITY)
Admission: RE | Admit: 2024-08-23 | Discharge: 2024-08-23 | Disposition: A | Attending: Obstetrics | Admitting: Obstetrics

## 2024-08-23 DIAGNOSIS — G4730 Sleep apnea, unspecified: Secondary | ICD-10-CM | POA: Insufficient documentation

## 2024-08-23 DIAGNOSIS — K219 Gastro-esophageal reflux disease without esophagitis: Secondary | ICD-10-CM | POA: Insufficient documentation

## 2024-08-23 DIAGNOSIS — R3989 Other symptoms and signs involving the genitourinary system: Secondary | ICD-10-CM

## 2024-08-23 DIAGNOSIS — I2510 Atherosclerotic heart disease of native coronary artery without angina pectoris: Secondary | ICD-10-CM

## 2024-08-23 DIAGNOSIS — I509 Heart failure, unspecified: Secondary | ICD-10-CM | POA: Diagnosis not present

## 2024-08-23 DIAGNOSIS — Z79899 Other long term (current) drug therapy: Secondary | ICD-10-CM | POA: Diagnosis not present

## 2024-08-23 DIAGNOSIS — I110 Hypertensive heart disease with heart failure: Secondary | ICD-10-CM | POA: Insufficient documentation

## 2024-08-23 DIAGNOSIS — F419 Anxiety disorder, unspecified: Secondary | ICD-10-CM | POA: Insufficient documentation

## 2024-08-23 DIAGNOSIS — I10 Essential (primary) hypertension: Secondary | ICD-10-CM

## 2024-08-23 DIAGNOSIS — R310 Gross hematuria: Secondary | ICD-10-CM | POA: Insufficient documentation

## 2024-08-23 DIAGNOSIS — I5042 Chronic combined systolic (congestive) and diastolic (congestive) heart failure: Secondary | ICD-10-CM

## 2024-08-23 DIAGNOSIS — N3289 Other specified disorders of bladder: Secondary | ICD-10-CM

## 2024-08-23 DIAGNOSIS — N3010 Interstitial cystitis (chronic) without hematuria: Secondary | ICD-10-CM

## 2024-08-23 HISTORY — PX: CYSTOSCOPY WITH HYDRODISTENSION AND BIOPSY: SHX5127

## 2024-08-23 HISTORY — DX: Sleep apnea, unspecified: G47.30

## 2024-08-23 LAB — BASIC METABOLIC PANEL WITH GFR
Anion gap: 9 (ref 5–15)
BUN/Creatinine Ratio: 15 (ref 9–23)
BUN: 14 mg/dL (ref 6–24)
BUN: 15 mg/dL (ref 6–20)
CO2: 23 mmol/L (ref 20–29)
CO2: 27 mmol/L (ref 22–32)
Calcium: 9.8 mg/dL (ref 8.7–10.2)
Calcium: 9.5 mg/dL (ref 8.9–10.3)
Chloride: 105 mmol/L (ref 96–106)
Chloride: 105 mmol/L (ref 98–111)
Creatinine, Ser: 0.96 mg/dL (ref 0.57–1.00)
Creatinine, Ser: 1 mg/dL (ref 0.44–1.00)
GFR, Estimated: >60 mL/min (ref >60)
Glucose, Bld: 142 mg/dL — ABNORMAL HIGH (ref 70–99)
Glucose: 87 mg/dL (ref 70–99)
Potassium: 4.4 mmol/L (ref 3.5–5.2)
Potassium: 4 mmol/L (ref 3.5–5.1)
Sodium: 144 mmol/L (ref 134–144)
Sodium: 140 mmol/L (ref 135–145)
eGFR: 72 mL/min/1.73 (ref >59)

## 2024-08-23 LAB — HIV ANTIBODY (ROUTINE TESTING W REFLEX): HIV Screen 4th Generation wRfx: NONREACTIVE

## 2024-08-23 LAB — HEPATITIS C ANTIBODY: Hep C Virus Ab: NONREACTIVE

## 2024-08-23 LAB — SYPHILIS: RPR W/REFLEX TO RPR TITER AND TREPONEMAL ANTIBODIES, TRADITIONAL SCREENING AND DIAGNOSIS ALGORITHM: RPR Ser Ql: NONREACTIVE

## 2024-08-23 SURGERY — CYSTOSCOPY, WITH BLADDER HYDRODISTENSION AND BIOPSY
Anesthesia: General | Site: Bladder

## 2024-08-23 MED ORDER — LIDOCAINE 2% (20 MG/ML) 5 ML SYRINGE
INTRAMUSCULAR | Status: AC
  Filled 2024-08-23: qty 5

## 2024-08-23 MED ORDER — FENTANYL CITRATE (PF) 100 MCG/2ML IJ SOLN
INTRAMUSCULAR | Status: AC
  Filled 2024-08-23: qty 2

## 2024-08-23 MED ORDER — MIDAZOLAM HCL (PF) 2 MG/2ML IJ SOLN
INTRAMUSCULAR | Status: DC | PRN
  Administered 2024-08-23: 13:00:00 2 mg via INTRAVENOUS

## 2024-08-23 MED ORDER — ORAL CARE MOUTH RINSE: 15.0000 mL | Freq: Once | OROMUCOSAL | Status: AC

## 2024-08-23 MED ORDER — MIDAZOLAM HCL 2 MG/2ML IJ SOLN
INTRAMUSCULAR | Status: AC
  Filled 2024-08-23: qty 2

## 2024-08-23 MED ORDER — ACETAMINOPHEN 500 MG PO TABS
1000.0000 mg | ORAL_TABLET | ORAL | Status: AC
  Administered 2024-08-23: 12:00:00 1000 mg via ORAL

## 2024-08-23 MED ORDER — PHENYLEPHRINE 80 MCG/ML (10ML) SYRINGE FOR IV PUSH (FOR BLOOD PRESSURE SUPPORT)
PREFILLED_SYRINGE | INTRAVENOUS | Status: AC
  Filled 2024-08-23: qty 10

## 2024-08-23 MED ORDER — STERILE WATER FOR IRRIGATION IR SOLN
Status: DC | PRN
  Administered 2024-08-23: 13:00:00 3000 mL

## 2024-08-23 MED ORDER — MEPERIDINE HCL 25 MG/ML IJ SOLN: 6.2500 mg | INTRAMUSCULAR | Status: DC | PRN

## 2024-08-23 MED ORDER — FENTANYL CITRATE (PF) 250 MCG/5ML IJ SOLN
INTRAMUSCULAR | Status: DC | PRN
  Administered 2024-08-23: 13:00:00 50 ug via INTRAVENOUS

## 2024-08-23 MED ORDER — ONDANSETRON HCL 4 MG/2ML IJ SOLN
INTRAMUSCULAR | Status: DC | PRN
  Administered 2024-08-23: 13:00:00 4 mg via INTRAVENOUS

## 2024-08-23 MED ORDER — PROPOFOL 10 MG/ML IV BOLUS
INTRAVENOUS | Status: DC | PRN
  Administered 2024-08-23: 13:00:00 150 mg via INTRAVENOUS

## 2024-08-23 MED ORDER — SODIUM CHLORIDE 0.9 % IR SOLN: Status: DC | PRN

## 2024-08-23 MED ORDER — CEFAZOLIN SODIUM-DEXTROSE 2-4 GM/100ML-% IV SOLN
2.0000 g | INTRAVENOUS | Status: AC
  Administered 2024-08-23: 13:00:00 2 g via INTRAVENOUS

## 2024-08-23 MED ORDER — HYDROMORPHONE HCL 1 MG/ML IJ SOLN
0.2500 mg | INTRAMUSCULAR | Status: DC | PRN
  Administered 2024-08-23: 15:00:00 0.25 mg via INTRAVENOUS
  Administered 2024-08-23: 15:00:00 0.5 mg via INTRAVENOUS

## 2024-08-23 MED ORDER — OXYCODONE HCL 5 MG PO TABS
ORAL_TABLET | ORAL | Status: AC
  Filled 2024-08-23: qty 1

## 2024-08-23 MED ORDER — FENTANYL CITRATE (PF) 100 MCG/2ML IJ SOLN
25.0000 ug | INTRAMUSCULAR | Status: DC | PRN
  Administered 2024-08-23 (×3): 50 ug via INTRAVENOUS

## 2024-08-23 MED ORDER — ONDANSETRON HCL 4 MG/2ML IJ SOLN
INTRAMUSCULAR | Status: AC
  Filled 2024-08-23: qty 2

## 2024-08-23 MED ORDER — PHENYLEPHRINE 80 MCG/ML (10ML) SYRINGE FOR IV PUSH (FOR BLOOD PRESSURE SUPPORT)
PREFILLED_SYRINGE | INTRAVENOUS | Status: DC | PRN
  Administered 2024-08-23 (×2): 80 ug via INTRAVENOUS

## 2024-08-23 MED ORDER — OXYCODONE HCL 5 MG PO TABS
5.0000 mg | ORAL_TABLET | Freq: Once | ORAL | Status: AC | PRN
  Administered 2024-08-23: 15:00:00 5 mg via ORAL

## 2024-08-23 MED ORDER — ONDANSETRON HCL 4 MG/2ML IJ SOLN: 4.0000 mg | Freq: Once | INTRAMUSCULAR | Status: DC | PRN

## 2024-08-23 MED ORDER — CEFAZOLIN SODIUM-DEXTROSE 2-4 GM/100ML-% IV SOLN
INTRAVENOUS | Status: AC
  Filled 2024-08-23: qty 100

## 2024-08-23 MED ORDER — DEXAMETHASONE SOD PHOSPHATE PF 10 MG/ML IJ SOLN
INTRAMUSCULAR | Status: DC | PRN
  Administered 2024-08-23: 13:00:00 10 mg via INTRAVENOUS

## 2024-08-23 MED ORDER — HYDROMORPHONE HCL 1 MG/ML IJ SOLN
INTRAMUSCULAR | Status: AC
  Filled 2024-08-23: qty 1

## 2024-08-23 MED ORDER — CHLORHEXIDINE GLUCONATE 0.12 % MT SOLN
OROMUCOSAL | Status: AC
  Filled 2024-08-23: qty 15

## 2024-08-23 MED ORDER — LIDOCAINE 2% (20 MG/ML) 5 ML SYRINGE
INTRAMUSCULAR | Status: DC | PRN
  Administered 2024-08-23: 13:00:00 60 mg via INTRAVENOUS

## 2024-08-23 MED ORDER — LACTATED RINGERS IV SOLN: INTRAVENOUS | Status: DC

## 2024-08-23 MED ORDER — OXYCODONE HCL 5 MG/5ML PO SOLN: 5.0000 mg | Freq: Once | ORAL | Status: AC | PRN

## 2024-08-23 MED ORDER — ACETAMINOPHEN 500 MG PO TABS
ORAL_TABLET | ORAL | Status: AC
  Filled 2024-08-23: qty 2

## 2024-08-23 MED ORDER — CHLORHEXIDINE GLUCONATE 0.12 % MT SOLN
15.0000 mL | Freq: Once | OROMUCOSAL | Status: AC
  Administered 2024-08-23: 12:00:00 15 mL via OROMUCOSAL

## 2024-08-23 SURGICAL SUPPLY — 13 items
BAG DRAIN URO-CYSTO SKYTR STRL (DRAIN) ×1 IMPLANT
ELECTRODE REM PT RTRN 9FT ADLT (ELECTROSURGICAL) IMPLANT
GLOVE BIOGEL PI IND STRL 6 (GLOVE) ×1 IMPLANT
GLOVE BIOGEL PI IND STRL 7.0 (GLOVE) IMPLANT
GLOVE BIOGEL PI IND STRL 7.5 (GLOVE) IMPLANT
GLOVE SS PI 5.5 STRL (GLOVE) ×1 IMPLANT
GOWN STRL REUS W/ TWL LRG LVL3 (GOWN DISPOSABLE) ×1 IMPLANT
GOWN STRL SURGICAL XL XLNG (GOWN DISPOSABLE) IMPLANT
KIT TURNOVER KIT B (KITS) ×1 IMPLANT
MANIFOLD NEPTUNE II (INSTRUMENTS) ×1 IMPLANT
PACK CYSTO (CUSTOM PROCEDURE TRAY) ×1 IMPLANT
SLEEVE SCD COMPRESS KNEE MED (STOCKING) ×1 IMPLANT
WATER STERILE IRR 3000ML UROMA (IV SOLUTION) IMPLANT

## 2024-08-23 NOTE — Transfer of Care (Signed)
 Immediate Anesthesia Transfer of Care Note  Patient: Summer Brewer  Procedure(s) Performed: CYSTOSCOPY, WITH BLADDER HYDRODISTENSION AND BIOPSY and FULGURATION (Bladder)  Patient Location: PACU  Anesthesia Type:General  Level of Consciousness: awake and sedated  Airway & Oxygen Therapy: Patient Spontanous Breathing and Patient connected to face mask oxygen  Post-op Assessment: Report given to RN and Post -op Vital signs reviewed and stable  Post vital signs: Reviewed and stable  Last Vitals:  Vitals Value Taken Time  BP 149/93 08/23/24 13:55  Temp    Pulse 71 08/23/24 13:57  Resp 14 08/23/24 13:57  SpO2 100 % 08/23/24 13:57  Vitals shown include unfiled device data.  Last Pain:  Vitals:   08/23/24 1158  TempSrc: Oral  PainSc: 0-No pain      Patients Stated Pain Goal: 5 (08/23/24 1158)  Complications: No notable events documented.

## 2024-08-23 NOTE — Anesthesia Preprocedure Evaluation (Signed)
 Anesthesia Evaluation  Patient identified by MRN, date of birth, ID band Patient awake    Reviewed: Allergy & Precautions, H&P , NPO status , Patient's Chart, lab work & pertinent test results  Airway Mallampati: I  TM Distance: >3 FB Neck ROM: Full    Dental no notable dental hx. (+) Teeth Intact   Pulmonary neg pulmonary ROS, sleep apnea    Pulmonary exam normal breath sounds clear to auscultation       Cardiovascular Exercise Tolerance: Good hypertension, Pt. on medications and Pt. on home beta blockers + CAD and +CHF  negative cardio ROS Normal cardiovascular exam Rhythm:Regular Rate:Normal    1. Left ventricular ejection fraction, by estimation, is 40 to 45%. The  left ventricle has mildly decreased function. The left ventricle  demonstrates global hypokinesis. There is mild left ventricular  hypertrophy. Left ventricular diastolic parameters  are consistent with Grade I diastolic dysfunction (impaired relaxation).   2. Right ventricular systolic function is mildly reduced. The right  ventricular size is normal. Tricuspid regurgitation signal is inadequate  for assessing PA pressure.   3. The mitral valve is normal in structure. Trivial mitral valve  regurgitation. No evidence of mitral stenosis.   4. The aortic valve was not well visualized. Aortic valve regurgitation  is not visualized. No aortic stenosis is present.   5. The inferior vena cava is normal in size with greater than 50%  respiratory variability, suggesting right atrial pressure of 3 mmHg.     Neuro/Psych  Headaches PSYCHIATRIC DISORDERS Anxiety      Neuromuscular disease negative neurological ROS  negative psych ROS   GI/Hepatic negative GI ROS, Neg liver ROS,GERD  ,,  Endo/Other  negative endocrine ROS    Renal/GU negative Renal ROS  negative genitourinary   Musculoskeletal negative musculoskeletal ROS (+)    Abdominal   Peds negative  pediatric ROS (+)  Hematology negative hematology ROS (+) Blood dyscrasia, anemia   Anesthesia Other Findings   Reproductive/Obstetrics negative OB ROS                              Anesthesia Physical Anesthesia Plan  ASA: 3  Anesthesia Plan: General   Post-op Pain Management: Minimal or no pain anticipated   Induction: Intravenous  PONV Risk Score and Plan: 3 and Ondansetron  and Dexamethasone   Airway Management Planned: Oral ETT and LMA  Additional Equipment: None  Intra-op Plan:   Post-operative Plan:   Informed Consent: I have reviewed the patients History and Physical, chart, labs and discussed the procedure including the risks, benefits and alternatives for the proposed anesthesia with the patient or authorized representative who has indicated his/her understanding and acceptance.       Plan Discussed with: Anesthesiologist and CRNA  Anesthesia Plan Comments: ( )         Anesthesia Quick Evaluation

## 2024-08-23 NOTE — Interval H&P Note (Signed)
 History and Physical Interval Note:  08/23/2024 12:27 PM  Summer Brewer  has presented today for surgery, with the diagnosis of Bladder pain and he,matura.  The various methods of treatment have been discussed with the patient and family. After consideration of risks, benefits and other options for treatment, the patient has consented to  Procedures: CYSTOSCOPY, WITH BLADDER HYDRODISTENSION AND BIOPSY (N/A) as a surgical intervention.  The patient's history has been reviewed, patient examined, no change in status, stable for surgery.  I have reviewed the patient's chart and labs.  Questions were answered to the patient's satisfaction.     Summer Brewer

## 2024-08-23 NOTE — Anesthesia Procedure Notes (Signed)
 Procedure Name: LMA Insertion Date/Time: 08/23/2024 1:00 PM  Performed by: Dalary Hollar C, CRNAPre-anesthesia Checklist: Patient identified, Emergency Drugs available, Suction available and Patient being monitored Patient Re-evaluated:Patient Re-evaluated prior to induction Oxygen Delivery Method: Circle System Utilized Preoxygenation: Pre-oxygenation with 100% oxygen Induction Type: IV induction Ventilation: Mask ventilation without difficulty LMA: LMA inserted LMA Size: 4.0 Number of attempts: 1 Airway Equipment and Method: Bite block Placement Confirmation: positive ETCO2 Tube secured with: Tape Dental Injury: Teeth and Oropharynx as per pre-operative assessment

## 2024-08-23 NOTE — Discharge Instructions (Addendum)

## 2024-08-23 NOTE — Op Note (Signed)
 Operative Note  Preoperative Diagnosis: Gross hematuria, Bladder pain  Postoperative Diagnosis: Gross hematuria, Bladder pain  Procedures performed:  Exam under anesthesia, cystoscopy with bladder hydrodistention and biopsy   Implants: * No implants in log *  Attending Surgeon: Lianne Leila Gillis, MD  Assistant Surgeon: n/a  Assistant: n/a  Anesthesia: General LMA  Findings: 1. On vaginal exam, stage I prolapse present  2. On cystoscopy, normal urethral mucosa without injury or lesion. Brisk bilateral ureteral efflux present. Two areas of erythema with central white clearing over bladder mucosa on the right posterior wall and towards bladder dome. No blood blood or bleeding noted.  Specimens: * No specimens in log *  Estimated blood loss: 5 mL  IV fluids: 500 mL  Urine output: 0 mL  Complications: none  Procedure in Detail:  After informed consent was obtained the patient was taken to the operating room where MAC anesthesia was induced and found to be adequate.  She was placed in dorsolithotomy position taking care to avoid any nerve injury and prepped and draped in the usual sterile fashion.  Cystoscopy was performed with a 70 degree scope and sterile water .    Two areas of erythema with central white clearing over bladder mucosa on the right posterior wall and towards bladder dome consistent with Hunner ulcers. No blood blood or bleeding noted. A bladder biopsy was obtained from the right bladder dome x 3 with endoscopic cup biopsy forceps advanced under direct visualization. A Bugbee electrode was passed through the cystoscope under direct visualization and used for hemostasis at biopsy site.   The water  was placed at 80 cm above the patient and allowing to drain freely. A 360 degree view of the bladder was obtained which showed normal bladder and urethral mucosa.  Bilateral ureteral efflux was present.   Once the bladder was full and the water  stopped running,  the water  was held  in the bladder for 5 minutes.  The bladder was then drained and capacity was measured at 650 mL. The cystoscope was then placed again and petechial hemorrhage was noted throughout the bladder.  The bladder was drained and the cystoscope was removed. The patient tolerated the procedure well was taken the recovery room in stable condition.

## 2024-08-24 ENCOUNTER — Encounter (HOSPITAL_COMMUNITY): Payer: Self-pay | Admitting: Obstetrics

## 2024-08-24 ENCOUNTER — Encounter: Payer: Self-pay | Admitting: Obstetrics

## 2024-08-24 ENCOUNTER — Other Ambulatory Visit (HOSPITAL_COMMUNITY)
Admission: RE | Admit: 2024-08-24 | Discharge: 2024-08-24 | Disposition: A | Discharge status: Home / Self Care | Source: Other Acute Inpatient Hospital | Attending: Obstetrics | Admitting: Obstetrics

## 2024-08-24 ENCOUNTER — Ambulatory Visit (INDEPENDENT_AMBULATORY_CARE_PROVIDER_SITE_OTHER)

## 2024-08-24 VITALS — BP 144/77 | HR 66 | Temp 97.8°F

## 2024-08-24 DIAGNOSIS — R3989 Other symptoms and signs involving the genitourinary system: Secondary | ICD-10-CM

## 2024-08-24 DIAGNOSIS — R319 Hematuria, unspecified: Secondary | ICD-10-CM | POA: Diagnosis present

## 2024-08-24 DIAGNOSIS — R82998 Other abnormal findings in urine: Secondary | ICD-10-CM

## 2024-08-24 LAB — URINALYSIS, COMPLETE (UACMP) WITH MICROSCOPIC
Glucose, UA: NEGATIVE mg/dL
Ketones, ur: NEGATIVE mg/dL
Nitrite: NEGATIVE
Protein, ur: 100 mg/dL — AB
RBC / HPF: >50 RBC/hpf (ref 0–5)
Specific Gravity, Urine: 1.024 (ref 1.005–1.030)
pH: 5 (ref 5.0–8.0)

## 2024-08-24 LAB — POCT URINALYSIS DIP (CLINITEK)
Bilirubin, UA: NEGATIVE
Glucose, UA: NEGATIVE mg/dL
Nitrite, UA: NEGATIVE
POC PROTEIN,UA: 100 — AB
Spec Grav, UA: 1.025 (ref 1.010–1.025)
Urobilinogen, UA: 0.2 U/dL
pH, UA: 5.5 (ref 5.0–8.0)

## 2024-08-24 LAB — SURGICAL PATHOLOGY

## 2024-08-24 MED ORDER — PHENAZOPYRIDINE HCL 200 MG PO TABS: 200.0000 mg | ORAL_TABLET | Freq: Three times a day (TID) | ORAL | 0 refills | Status: AC | PRN

## 2024-08-24 MED ORDER — NITROFURANTOIN MONOHYD MACRO 100 MG PO CAPS: 100.0000 mg | ORAL_CAPSULE | Freq: Two times a day (BID) | ORAL | 0 refills | Status: AC

## 2024-08-24 NOTE — Anesthesia Postprocedure Evaluation (Signed)
 Anesthesia Post Note  Patient: Summer Brewer  Procedure(s) Performed: CYSTOSCOPY, WITH BLADDER HYDRODISTENSION AND BIOPSY and FULGURATION (Bladder)     Patient location during evaluation: PACU Anesthesia Type: General Level of consciousness: awake and alert Pain management: pain level controlled Vital Signs Assessment: post-procedure vital signs reviewed and stable Respiratory status: spontaneous breathing, nonlabored ventilation, respiratory function stable and patient connected to nasal cannula oxygen Cardiovascular status: blood pressure returned to baseline and stable Postop Assessment: no apparent nausea or vomiting Anesthetic complications: no   No notable events documented.  Last Vitals:  Vitals:   08/23/24 1530 08/23/24 1545  BP: (!) 126/97 (!) 133/92  Pulse:    Resp: 15 18  Temp:    SpO2:      Last Pain:  Vitals:   08/23/24 1545  TempSrc:   PainSc: 3    Pain Goal: Patients Stated Pain Goal: 4 (4-5) (08/23/24 1448)                 Ry Moody

## 2024-08-24 NOTE — Patient Instructions (Addendum)
 Please call if you continue to experience persistent or worsening urinary symptoms such as fever > 100.4, nausea/vomiting, one sided back pain or blood in your urine.    We have sent medication to the pharmacy for you.   It was a pleasure to see you today!  Thank you for trusting me with your care!

## 2024-08-24 NOTE — Progress Notes (Addendum)
 Patient came in today with complaints of not being able to hold her urine and bilateral back pain. UA shows moderate blood and small leukocytes. Patient denies nausea or vomiting, denies fever.  PVR was 85. Urine will be sent for a culture and micro. Treaded with Macrobid  for the possible UTI.  Patient case discussed with Kaitlin. Continue the ibuprofen  and Tylenol . Biopsy was from the back wall of the bladder, this could be deflected pain from that. Urinary incontinence could be the UTI.  Will await pathology results from biopsy and culture.   Patient to call if continues to experience persistent or worsening urinary symptoms such as fever > 100.4, nausea/vomiting, one sided back pain or blood in your urine.

## 2024-08-25 ENCOUNTER — Telehealth: Payer: Self-pay | Admitting: Cardiology

## 2024-08-25 ENCOUNTER — Telehealth: Payer: Self-pay

## 2024-08-25 ENCOUNTER — Ambulatory Visit: Payer: Self-pay | Admitting: Obstetrics

## 2024-08-25 LAB — URINE CULTURE: Culture: NO GROWTH

## 2024-08-25 NOTE — Telephone Encounter (Signed)
 Called Summer Brewer today. She was able to pick up her prescriptions from the pharmacy. She states she feels improved from yesterday, still sore today. Denies any worsening in symptoms.

## 2024-08-25 NOTE — Telephone Encounter (Signed)
° °  Insurance sending and calling need sent in by 08/26/24

## 2024-08-25 NOTE — Telephone Encounter (Signed)
 This is being worked on in another audiological scientist. Sent rph another message

## 2024-08-25 NOTE — Telephone Encounter (Signed)
 [facility] Clinical Appeals Dept  will faxing request for additional info for zepbound. Wanted to make office aware.   Callback [phone]

## 2024-08-29 ENCOUNTER — Encounter (HOSPITAL_BASED_OUTPATIENT_CLINIC_OR_DEPARTMENT_OTHER): Admitting: Family

## 2024-08-29 NOTE — Telephone Encounter (Signed)
 Mychart message sent to pt for this information

## 2024-09-01 ENCOUNTER — Other Ambulatory Visit: Payer: Self-pay | Admitting: Family Medicine

## 2024-09-05 NOTE — Telephone Encounter (Signed)
" ° ° ° °  Faxed to [phone]  "

## 2024-09-06 DIAGNOSIS — G4733 Obstructive sleep apnea (adult) (pediatric): Secondary | ICD-10-CM

## 2024-09-09 MED ORDER — ZEPBOUND 2.5 MG/0.5ML ~~LOC~~ SOAJ: 2.5000 mg | SUBCUTANEOUS | 0 refills | Status: DC

## 2024-09-09 NOTE — Addendum Note (Signed)
 Addended by: DARRELL BAREFOOT on: 09/09/2024 12:26 PM   Modules accepted: Orders

## 2024-09-12 NOTE — Telephone Encounter (Signed)
Left vm for pt to call back to schedule OV

## 2024-09-12 NOTE — Telephone Encounter (Signed)
 Zepbound  appeal has been approved appeal submitted on Aug 11, 2024.

## 2024-09-13 ENCOUNTER — Ambulatory Visit: Admitting: Obstetrics and Gynecology

## 2024-09-13 VITALS — BP 143/83 | HR 76

## 2024-09-13 DIAGNOSIS — R310 Gross hematuria: Secondary | ICD-10-CM | POA: Diagnosis not present

## 2024-09-13 DIAGNOSIS — R300 Dysuria: Secondary | ICD-10-CM | POA: Diagnosis not present

## 2024-09-13 DIAGNOSIS — R3989 Other symptoms and signs involving the genitourinary system: Secondary | ICD-10-CM

## 2024-09-13 DIAGNOSIS — M5431 Sciatica, right side: Secondary | ICD-10-CM

## 2024-09-13 LAB — POCT URINE DIPSTICK
Bilirubin, UA: NEGATIVE
Glucose, UA: >=1000 mg/dL — AB
Ketones, POC UA: NEGATIVE mg/dL
Leukocytes, UA: NEGATIVE
Nitrite, UA: NEGATIVE
Spec Grav, UA: 1.025
Urobilinogen, UA: 0.2 U/dL
pH, UA: 5.5

## 2024-09-13 MED ORDER — URO-MP 118 MG PO CAPS: 1.0000 | ORAL_CAPSULE | Freq: Three times a day (TID) | ORAL | 2 refills | Status: AC | PRN

## 2024-09-13 MED ORDER — BUPIVACAINE HCL 0.25 % IJ SOLN
20.0000 mL | Freq: Once | INTRAMUSCULAR | Status: AC
  Administered 2024-09-13: 20 mL

## 2024-09-13 MED ORDER — SODIUM BICARBONATE 8.4 % IV SOLN
5.0000 mL | Freq: Once | INTRAVENOUS | Status: AC
  Administered 2024-09-13: 5 mL

## 2024-09-13 MED ORDER — HYDROXYZINE HCL 10 MG PO TABS: 10.0000 mg | ORAL_TABLET | Freq: Every evening | ORAL | 1 refills | Status: AC | PRN

## 2024-09-13 MED ORDER — HEPARIN SODIUM (PORCINE) 10000 UNIT/ML IJ SOLN
10000.0000 [IU] | Freq: Once | INTRAMUSCULAR | Status: AC
  Administered 2024-09-13: 10000 [IU] via INTRAVESICAL

## 2024-09-13 MED ORDER — LIDOCAINE HCL 2 % IJ SOLN
20.0000 mL | Freq: Once | INTRAMUSCULAR | Status: AC
  Administered 2024-09-13: 400 mg

## 2024-09-13 MED ORDER — VIBEGRON 75 MG PO TABS: 75.0000 mg | ORAL_TABLET | Freq: Every day | ORAL | 5 refills | Status: DC

## 2024-09-13 MED ORDER — PREDNISONE 20 MG PO TABS: 20.0000 mg | ORAL_TABLET | Freq: Every day | ORAL | 0 refills | Status: AC

## 2024-09-13 NOTE — Progress Notes (Signed)
bladder

## 2024-09-13 NOTE — Progress Notes (Signed)
 Summer Brewer  SUBJECTIVE  History of Present Illness: Summer Brewer is a 54 y.o. female seen in follow-up for Gross hematuria concerns related to recent bladder biopsy.   Patient endorses she has had multiple episodes of blood clots noted coming from her urethra. She states she is having some irritation in the bladder.   Patient also endorses having back pain since her biopsy, possibly from positioning on the table. She reports it is lower lumbar and runs down the right leg.    Past Medical History: Patient  has a past medical history of Anemia, Anxiety, Back pain, CAD (coronary artery disease), Cardiomyopathy (HCC) (03/2023), CHF (congestive heart failure) (HCC), Eczema, Fibroid, GERD (gastroesophageal reflux disease), Hypertension, Migraines, Post-operative state (09/30/2022), Preterm labor, Sleep apnea, Umbilical hernia, and Vaginal Pap smear, abnormal.   Past Surgical History: She  has a past surgical history that includes Tubal ligation; Cesarean section (2000); Umbilical hernia repair (08/09/2015); Insertion of mesh (08/09/2015); Nasal polyp excision (Bilateral, 2016); Dilatation & currettage/hysteroscopy with hydrothermal ablation (N/A, 11/19/2021); Vaginal hysterectomy (Bilateral, 09/30/2022); transthoracic echocardiogram (03/29/2023); and Cystoscopy with hydrodistension and biopsy (N/A, 08/23/2024).   Medications: She has a current medication list which includes the following prescription(s): acetaminophen , albuterol , amlodipine , cetirizine , clobetasol  ointment, farxiga , ezetimibe , famotidine , fluticasone , ibuprofen , methocarbamol , metoprolol  succinate, iberogast, nitrofurantoin  (macrocrystal-monohydrate), oxycodone , phenazopyridine , prednisone , sacubitril -valsartan , scopolamine , spironolactone , zepbound , triamcinolone  cream, vibegron , hydroxyzine , and uro-mp .   Allergies: Patient is allergic to latex and tizanidine .   Social History: Patient  reports  that she has never smoked. She has never been exposed to tobacco smoke. She has never used smokeless tobacco. She reports that she does not drink alcohol and does not use drugs.     OBJECTIVE    Lab Results  Component Value Date   COLORU yellow 09/13/2024   CLARITYU clear 09/13/2024   GLUCOSEUR >=1,000 (A) 09/13/2024   BILIRUBINUR negative 09/13/2024   KETONESU Negative 10/09/2022   SPECGRAV 1.025 09/13/2024   RBCUR moderate (A) 09/13/2024   PHUR 5.5 09/13/2024   PROTEINUR 100 (A) 08/24/2024   UROBILINOGEN 0.2 09/13/2024   LEUKOCYTESUR Negative 09/13/2024     Physical Exam: Vitals:   09/13/24 1514  BP: (!) 143/83  Pulse: 76   Gen: No apparent distress, A&O x 3.  Detailed Urogynecologic Evaluation:  External vaginal exam WNL.  Urethra opening normal on exam.  Bladder Instillation: The patient was identified and verbally consented for the procedure.  The urethra was prepped with Betadine  x 3. A 16 Fr foley catheter was inserted the bladder and drained for 120cc. The foley was then attached to a 60 mL syringe with the plunger removed.  The medication was slowly poured into the bladder via the syringe and foley.  The medication consisted of: 20ml of Lidocaine  2%, 20mL of Bupivicaine 0.25%, 10,000 units/mL Heparin , 5mL Sodium Bicarbonate  8.4%.  The foley was removed and the patient was asked to hold the liquids in her bladder for 30-60 minutes if possible.      ASSESSMENT AND PLAN    Summer Brewer is a 54 y.o. with:  1. Bladder pain   2. Gross hematuria   3. Dysuria   4. Sciatica of right side    Bladder instillation done today for topical support. Will re-send Uribel in for patient as she has been out and she reports she had relief with this when she was taking it.  Cathed Urine to be sent for pathnostics and micro. Encouraged patient to re-start antihistamine (Hydroxyzine ) to decrease inflammation as  well as use of uribel and have prescribed Gemtesa  75mg  daily for  bladder spasm support.  Concern of sciatic nerve irritation related to positioning during surgery. Short course of steroids, prednisone  20mg  x5 days prescribed to see if this provides relief for the nerves.   Patient to follow up with Dr. Guadlupe next week for check and another bladder instillation.      Summer Maruyama G Melodye Swor, NP

## 2024-09-14 ENCOUNTER — Telehealth (HOSPITAL_COMMUNITY): Payer: Self-pay

## 2024-09-14 NOTE — Telephone Encounter (Signed)
 Auth Submission: NO AUTH NEEDED Site of care: Site of care: [facility] Payer: [employer] Medication & CPT/J Code(s) submitted: Vyepti  (Eptinezumab ) [id number] Diagnosis Code: G43.909 Route of submission (phone, fax, portal): portal Phone # Fax # Auth type: Buy/Bill HB Units/visits requested: 300mg  q45months Reference number:  Approval from: 09/08/24 to 12/06/24

## 2024-09-19 ENCOUNTER — Other Ambulatory Visit: Payer: Self-pay | Admitting: Obstetrics and Gynecology

## 2024-09-19 ENCOUNTER — Telehealth

## 2024-09-19 DIAGNOSIS — J019 Acute sinusitis, unspecified: Secondary | ICD-10-CM

## 2024-09-19 DIAGNOSIS — B9689 Other specified bacterial agents as the cause of diseases classified elsewhere: Secondary | ICD-10-CM | POA: Diagnosis not present

## 2024-09-19 DIAGNOSIS — R3989 Other symptoms and signs involving the genitourinary system: Secondary | ICD-10-CM

## 2024-09-20 ENCOUNTER — Ambulatory Visit: Payer: Self-pay | Admitting: Obstetrics

## 2024-09-20 ENCOUNTER — Other Ambulatory Visit (HOSPITAL_COMMUNITY): Payer: Self-pay

## 2024-09-20 ENCOUNTER — Telehealth: Payer: Self-pay | Admitting: Pharmacy Technician

## 2024-09-20 DIAGNOSIS — B9689 Other specified bacterial agents as the cause of diseases classified elsewhere: Secondary | ICD-10-CM

## 2024-09-20 MED ORDER — AMOXICILLIN-POT CLAVULANATE 875-125 MG PO TABS: 1.0000 | ORAL_TABLET | Freq: Two times a day (BID) | ORAL | 0 refills | Status: AC

## 2024-09-20 MED ORDER — PROMETHAZINE-DM 6.25-15 MG/5ML PO SYRP: 5.0000 mL | ORAL_SOLUTION | Freq: Four times a day (QID) | ORAL | 0 refills | Status: AC | PRN

## 2024-09-20 MED ORDER — METRONIDAZOLE 500 MG PO TABS: 500.0000 mg | ORAL_TABLET | Freq: Two times a day (BID) | ORAL | 0 refills | Status: DC

## 2024-09-20 NOTE — Progress Notes (Signed)
"      E-Visit for Sinus Problems  We are sorry that you are not feeling well.  Here is how we plan to help!  Based on what you have shared with me it looks like you have sinusitis.  Sinusitis is inflammation and infection in the sinus cavities of the head.  Based on your presentation I believe you most likely have Acute Bacterial Sinusitis.  This is an infection caused by bacteria and is treated with antibiotics. I have prescribed Augmentin  875mg /125mg  one tablet twice daily with food, for 7 days. I have prescribed Promethazine  DM cough syrup Take 5mL every 6 hours as needed for cough. You may use an oral decongestant such as Mucinex D or if you have glaucoma or high blood pressure use plain Mucinex. Saline nasal spray help and can safely be used as often as needed for congestion.  If you develop worsening sinus pain, fever or notice severe headache and vision changes, or if symptoms are not better after completion of antibiotic, please schedule an appointment with a health care provider.    Sinus infections are not as easily transmitted as other respiratory infection, however we still recommend that you avoid close contact with loved ones, especially the very young and elderly.  Remember to wash your hands thoroughly throughout the day as this is the number one way to prevent the spread of infection!  Home Care: Only take medications as instructed by your medical team. Complete the entire course of an antibiotic. Do not take these medications with alcohol. A steam or ultrasonic humidifier can help congestion.  You can place a towel over your head and breathe in the steam from hot water  coming from a faucet. Avoid close contacts especially the very young and the elderly. Cover your mouth when you cough or sneeze. Always remember to wash your hands.  Get Help Right Away If: You develop worsening fever or sinus pain. You develop a severe head ache or visual changes. Your symptoms persist after you  have completed your treatment plan.  Make sure you Understand these instructions. Will watch your condition. Will get help right away if you are not doing well or get worse.  Your e-visit answers were reviewed by a board certified advanced clinical practitioner to complete your personal care plan.  Depending on the condition, your plan could have included both over the counter or prescription medications.  If there is a problem please reply  once you have received a response from your provider.  Your safety is important to us .  If you have drug allergies check your prescription carefully.    You can use MyChart to ask questions about todays visit, request a non-urgent call back, or ask for a work or school excuse for 24 hours related to this e-Visit. If it has been greater than 24 hours you will need to follow up with your provider, or enter a new e-Visit to address those concerns.  You will get an e-mail in the next two days asking about your experience.  I hope that your e-visit has been valuable and will speed your recovery. Thank you for using e-visits.  I have spent 5 minutes in review of e-visit questionnaire, review and updating patient chart, medical decision making and response to patient.   Delon CHRISTELLA Dickinson, PA-C     "

## 2024-09-20 NOTE — Telephone Encounter (Signed)
 Pharmacy Patient Advocate Encounter   Received notification from RX Request Messages that prior authorization for Gemtesa  75mg  tablets is required/requested.   Insurance verification completed.   The patient is insured through [facility] MEDICAID.   Per test claim:  See below is preferred by the insurance.  If suggested medication is appropriate, Please send in a new RX and discontinue this one. If not, please advise as to why it's not appropriate so that we may request a Prior Authorization. Please note, some preferred medications may still require a PA.  If the suggested medications have not been trialed and there are no contraindications to their use, the PA will not be submitted, as it will not be approved. Archived Key:    Insurance requires documentation of a trial/failure or contraindication to 2 preferred medications before they will approved the non-preferred medication Gemtesa . I only see a trial of Myrbetriq . Has patient tried any other of the preferred medications? Please advise.

## 2024-09-21 ENCOUNTER — Ambulatory Visit (INDEPENDENT_AMBULATORY_CARE_PROVIDER_SITE_OTHER): Admitting: Obstetrics

## 2024-09-21 ENCOUNTER — Other Ambulatory Visit (HOSPITAL_COMMUNITY): Payer: Self-pay

## 2024-09-21 ENCOUNTER — Encounter: Payer: Self-pay | Admitting: Obstetrics

## 2024-09-21 VITALS — BP 126/81 | HR 70

## 2024-09-21 DIAGNOSIS — N760 Acute vaginitis: Secondary | ICD-10-CM

## 2024-09-21 DIAGNOSIS — B9689 Other specified bacterial agents as the cause of diseases classified elsewhere: Secondary | ICD-10-CM

## 2024-09-21 DIAGNOSIS — R351 Nocturia: Secondary | ICD-10-CM | POA: Diagnosis not present

## 2024-09-21 DIAGNOSIS — R3989 Other symptoms and signs involving the genitourinary system: Secondary | ICD-10-CM | POA: Diagnosis not present

## 2024-09-21 MED ORDER — LIDOCAINE HCL 2 % IJ SOLN
20.0000 mL | Freq: Once | INTRAMUSCULAR | Status: AC
  Administered 2024-09-21: 400 mg

## 2024-09-21 MED ORDER — METRONIDAZOLE 0.75 % VA GEL: 1.0000 | Freq: Every day | VAGINAL | 0 refills | Status: AC

## 2024-09-21 MED ORDER — SODIUM BICARBONATE 8.4 % IV SOLN
5.0000 mL | Freq: Once | INTRAVENOUS | Status: AC
  Administered 2024-09-21: 5 mL

## 2024-09-21 MED ORDER — HEPARIN SODIUM (PORCINE) 10000 UNIT/ML IJ SOLN
10000.0000 [IU] | Freq: Once | INTRAMUSCULAR | Status: AC
  Administered 2024-09-21: 10000 [IU] via INTRAVESICAL

## 2024-09-21 MED ORDER — BUPIVACAINE HCL 0.25 % IJ SOLN
20.0000 mL | Freq: Once | INTRAMUSCULAR | Status: AC
  Administered 2024-09-21: 20 mL

## 2024-09-21 NOTE — Addendum Note (Signed)
 Addended by: KRYSTAL ANDREE GAILS on: 09/21/2024 02:22 PM   Modules accepted: Orders

## 2024-09-21 NOTE — Assessment & Plan Note (Addendum)
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  We also discussed using pyridium  and similar over-the-counter medications for pain relief as needed. We discussed the pentad of medications including Tums, an antihistamine such as Vistaril , amitriptyline , and L-arginine.  We also discussed in-office bladder instillations for pain flares, as well as cystoscopy with hydrodistention in the operating room, which can be both diagnostic and therapeutic.  - continue tylenol  PRN pain - s/p cystoscopy with bladder hydrodistention and biopsy on 08/23/24 - continue Uribel/Uro-MP  as needed  - increase hydoxyzine to 20mg  at dinner to reduce inflammation if she discontinues nortriptyline . Patient reports desire to discontinue nortriptyline , pending message to Dr. Skeet and postpone changing  medications until after review with Dr. Skeet due to risk of withdrawal symptoms. - continue bladder instillation. Return for repeat in 1 week for a total of 6. - referral to pelvic floor PT, provided number for patient to call for appointment - continue Gemtesa  samples, pending PA - stop Robaxin

## 2024-09-21 NOTE — Assessment & Plan Note (Signed)
-   [facility] 09/13/24 from urine positive for bacterial vaginosis - unable to take PO flagyl , Rx changed to metrogel

## 2024-09-21 NOTE — Assessment & Plan Note (Addendum)
-   avoid fluid intake 3 hours before bedtime - elevated feet during the day or use compression socks to reduce lower extremity swelling - switch timing of diuretic (e.g. spironolactone ) dosing  - due to snoring, start CPAP for sleep apnea - continue Gemtesa , pending PA - tried mirabegron  in the past without relief

## 2024-09-21 NOTE — Progress Notes (Signed)
 [facility]  Date of Visit: 09/21/2024  History of Present Illness: Summer Brewer is a 54 y.o. female scheduled today for a post-operative visit.   Surgery: s/p Exam under anesthesia, cystoscopy with bladder hydrodistention and biopsy on 08/23/24  Postoperative course was uncomplicated by exacerbation of bladder pain.   Office visit POD#1 due to increased urinary frequency, leakage and bilateral back pain. UA +leuk/prot/heme/ketones. Culture with no growth. Rx macrobid  for presumed UTI. PVR 85mL Presented POD#21 on 09/13/24 for gross hematuria, underwent bladder instillation, Rx uribel, hydroxyzine , gemtesa , and short course prednisone  for sciatic nerve irritation.   Today she reports starting Uribel yesterday for pelvic pressure sensation with bladder pressure sensation. Symptoms improved after bladder instillation, blood in urine resumed last week.   Started Gemtesa  with increased urinary frequency, voided 5x/night  Stops drinking fluids around 7pm, sleeps around 10-11pm Day time voids 7.  Nocturia: 4 times per night to void.  SUI 2 x/day, UUI 1x/week Denies CPAP use for OSA, reports snoring Drinks: 6-7 Bottles of water  down to 5 bottles/day, occasional tea and daily juice (Minute maid, simply, cranberry) per day  Continues hydroxyzine  10mg  at night for anxiety and itching. Robaxin  without relief. Tried mirabegron  in the past without relief  Pathology results:  A. 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   Allergies: Patient is allergic to latex and tizanidine .   Physical Exam: BP 126/81   Pulse 70   LMP 08/10/2022   General: NAD Cardiac: regular rate Pulm: no acute resp distress Musc: no CVA tenderness, reproducible SI joint pain  Bladder Instillation: The patient was identified and verbally consented for the procedure.  The urethra was prepped with Betadine  x 3. A 16 Fr foley catheter was inserted the bladder and drained for 90 cc of green urine. The foley was then attached to a 60 mL syringe with the plunger removed.  The medication was slowly poured into the bladder via the syringe and foley.  The medication consisted of: 20ml of Lidocaine  2%, 20mL of Bupivicaine 0.25%, 10,000 units/mL Heparin , 5mL Sodium Bicarbonate  8.4%.  The foley was removed and the patient was asked to hold the liquids in her bladder for 30-60 minutes if possible.   Precautions were given and patient was instructed to call the office or on-call number for any concerns.  Lab Results  Component Value Date   CREATININE 1.00 08/23/2024    ---------------------------------------------------------  Assessment and Plan:  1. Bladder pain   2. Bacterial vaginosis   3. 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  We also discussed using pyridium  and similar over-the-counter medications for pain relief as needed. We discussed the pentad of medications including Tums, an antihistamine such as Vistaril , amitriptyline , and L-arginine.  We also discussed in-office bladder instillations for pain  flares, as well as cystoscopy with hydrodistention in the operating room, which can be both diagnostic and therapeutic.  - continue tylenol  PRN  pain - s/p cystoscopy with bladder hydrodistention and biopsy on 08/23/24 - continue Uribel/Uro-MP  as needed  - increase hydoxyzine to 20mg  at dinner to reduce inflammation if she discontinues nortriptyline . Patient reports desire to discontinue nortriptyline , pending message to Dr. Skeet and postpone changing  medications until after review with Dr. Skeet due to risk of withdrawal symptoms. - continue bladder instillation. Return for repeat in 1 week for a total of 6. - referral to pelvic floor PT, provided number for patient to call for appointment - continue Gemtesa  samples, pending PA - stop Robaxin   Orders: -     AMB referral to rehabilitation  Bacterial vaginosis Assessment & Plan: - [facility] 09/13/24 from urine positive for bacterial vaginosis - unable to take PO flagyl , Rx changed to metrogel   Orders: -     metroNIDAZOLE ; Place 1 Applicatorful vaginally at bedtime for 7 days.  Dispense: 70 g; Refill: 0  Nocturia Assessment & Plan: - avoid fluid intake 3 hours before bedtime - elevated feet during the day or use compression socks to reduce lower extremity swelling - switch timing of diuretic (e.g. spironolactone ) dosing  - due to snoring, start CPAP for sleep apnea - continue Gemtesa , pending PA - tried mirabegron  in the past without relief    - Pathology results were reviewed with the patient today and she verbalized understanding that the results were benign.  - Can resume regular activity including exercise and intercourse,  if desired.  - Discussed avoidance of heavy lifting and straining long term to reduce the risk of recurrence.   All questions answered.   Return in about 1 week (around 09/28/2024) for bladder instillation.

## 2024-09-21 NOTE — Patient Instructions (Addendum)
 Increase hydroxyzine  20mg  at dinnertime to reassess your bladder discomfort.   Continue Uribel as needed for comfort.   Return in 1 week for bladder instillation.   The origin of pelvic floor muscle spasm can be multifactorial, including primary, reactive to a different pain source, trauma, or even part of a centralized pain syndrome.Treatment options include pelvic floor physical therapy, local (vaginal) or oral  muscle relaxants, pelvic muscle trigger point injections or centrally acting pain medications.    Please call [phone] to schedule the earliest appointment for pelvic floor PT.  Start metrogel  for bacterial vaginosis.   Use Volteran 2g up to 4 times day for back pain, use warm compress as needed for comfort.

## 2024-09-22 ENCOUNTER — Telehealth: Payer: Self-pay | Admitting: Pharmacist

## 2024-09-22 NOTE — Telephone Encounter (Signed)
 Appeal has been submitted. Will advise when response is received, please be advised that most companies may take 30 days to make a decision. Appeal letter and supporting documentation have been faxed to [phone] on 09/23/2023 @3 :38 pm.   Thank you, Devere Pandy, PharmD Clinical Pharmacist  [facility]  Direct Dial: [phone]

## 2024-09-22 NOTE — Telephone Encounter (Signed)
 Devere,   Can you file an appeal for this denial?

## 2024-09-26 ENCOUNTER — Other Ambulatory Visit: Payer: Self-pay | Admitting: Cardiology

## 2024-09-26 DIAGNOSIS — I10 Essential (primary) hypertension: Secondary | ICD-10-CM

## 2024-09-28 ENCOUNTER — Other Ambulatory Visit (HOSPITAL_COMMUNITY)
Admission: RE | Admit: 2024-09-28 | Discharge: 2024-09-28 | Disposition: A | Discharge status: Home / Self Care | Source: Ambulatory Visit | Attending: Obstetrics | Admitting: Obstetrics

## 2024-09-28 ENCOUNTER — Ambulatory Visit

## 2024-09-28 VITALS — BP 136/88 | HR 105

## 2024-09-28 DIAGNOSIS — R82998 Other abnormal findings in urine: Secondary | ICD-10-CM

## 2024-09-28 DIAGNOSIS — R3989 Other symptoms and signs involving the genitourinary system: Secondary | ICD-10-CM

## 2024-09-28 DIAGNOSIS — R350 Frequency of micturition: Secondary | ICD-10-CM

## 2024-09-28 DIAGNOSIS — R300 Dysuria: Secondary | ICD-10-CM

## 2024-09-28 DIAGNOSIS — R319 Hematuria, unspecified: Secondary | ICD-10-CM

## 2024-09-28 LAB — POCT URINALYSIS DIP (CLINITEK)
Glucose, UA: NEGATIVE mg/dL
Ketones, POC UA: NEGATIVE mg/dL
Nitrite, UA: NEGATIVE
POC PROTEIN,UA: NEGATIVE
Spec Grav, UA: 1.025
Urobilinogen, UA: 0.2 U/dL
pH, UA: 6

## 2024-09-28 LAB — URINALYSIS, COMPLETE (UACMP) WITH MICROSCOPIC
Bilirubin Urine: NEGATIVE
Glucose, UA: NEGATIVE mg/dL
Ketones, ur: NEGATIVE mg/dL
Nitrite: NEGATIVE
Protein, ur: NEGATIVE mg/dL
Specific Gravity, Urine: 1.025 (ref 1.005–1.030)
pH: 5 (ref 5.0–8.0)

## 2024-09-28 MED ORDER — LIDOCAINE HCL 2 % IJ SOLN
20.0000 mL | Freq: Once | INTRAMUSCULAR | Status: AC
  Administered 2024-09-28: 400 mg

## 2024-09-28 MED ORDER — BUPIVACAINE HCL 0.25 % IJ SOLN
20.0000 mL | Freq: Once | INTRAMUSCULAR | Status: AC
  Administered 2024-09-28: 20 mL

## 2024-09-28 MED ORDER — HEPARIN SODIUM (PORCINE) 10000 UNIT/ML IJ SOLN
10000.0000 [IU] | Freq: Once | INTRAMUSCULAR | Status: AC
  Administered 2024-09-28: 10000 [IU] via INTRAVESICAL

## 2024-09-28 MED ORDER — SODIUM BICARBONATE 8.4 % IV SOLN
5.0000 mL | Freq: Once | INTRAVENOUS | Status: AC
  Administered 2024-09-28: 5 mL

## 2024-09-28 NOTE — Progress Notes (Signed)
 Bladder Instillation: The patient was identified and verbally consented for the procedure.  The urethra was prepped with Betadine  x 3. A 16 Fr foley catheter was inserted the bladder and drained for 10 cc. The foley was then attached to a 60 mL syringe with the plunger removed.  The medication was slowly poured into the bladder via the syringe and foley.  The medication consisted of: 20ml of Lidocaine  2%, 20mL of Bupivicaine 0.25%, 10,000 units/mL Heparin , 5mL Sodium Bicarbonate  8.4%.  The foley was removed and the patient was asked to hold the liquids in her bladder for 30-60 minutes if possible.   Precautions were given and patient was instructed to call the office or on-call number for any concerns.  [name], CMA

## 2024-09-28 NOTE — Patient Instructions (Signed)
 Thank you for entrusting us  with your care. Please keep all future appointments and if you have any questions or concerns please feel free to contact our office at [phone].

## 2024-09-29 ENCOUNTER — Ambulatory Visit: Payer: Self-pay | Admitting: Obstetrics

## 2024-09-29 LAB — URINE CULTURE: Culture: NO GROWTH

## 2024-10-05 ENCOUNTER — Ambulatory Visit: Payer: Self-pay | Admitting: Obstetrics

## 2024-10-05 ENCOUNTER — Other Ambulatory Visit (HOSPITAL_COMMUNITY)
Admission: RE | Admit: 2024-10-05 | Discharge: 2024-10-05 | Disposition: A | Discharge status: Home / Self Care | Source: Ambulatory Visit | Attending: Obstetrics and Gynecology | Admitting: Obstetrics and Gynecology

## 2024-10-05 ENCOUNTER — Ambulatory Visit

## 2024-10-05 ENCOUNTER — Other Ambulatory Visit: Payer: Self-pay | Admitting: Physician Assistant

## 2024-10-05 ENCOUNTER — Other Ambulatory Visit (HOSPITAL_COMMUNITY): Payer: Self-pay

## 2024-10-05 DIAGNOSIS — L292 Pruritus vulvae: Secondary | ICD-10-CM

## 2024-10-05 DIAGNOSIS — N899 Noninflammatory disorder of vagina, unspecified: Secondary | ICD-10-CM

## 2024-10-05 DIAGNOSIS — N898 Other specified noninflammatory disorders of vagina: Secondary | ICD-10-CM

## 2024-10-05 DIAGNOSIS — R350 Frequency of micturition: Secondary | ICD-10-CM

## 2024-10-05 DIAGNOSIS — G4733 Obstructive sleep apnea (adult) (pediatric): Secondary | ICD-10-CM

## 2024-10-05 DIAGNOSIS — R3989 Other symptoms and signs involving the genitourinary system: Secondary | ICD-10-CM

## 2024-10-05 LAB — POCT URINALYSIS DIP (CLINITEK)
Glucose, UA: 100 mg/dL — AB
Ketones, POC UA: NEGATIVE mg/dL
Leukocytes, UA: NEGATIVE
Nitrite, UA: NEGATIVE
POC PROTEIN,UA: NEGATIVE
Spec Grav, UA: >=1.03 — AB
Urobilinogen, UA: 0.2 U/dL
pH, UA: 5.5

## 2024-10-05 MED ORDER — LIDOCAINE HCL 2 % IJ SOLN
20.0000 mL | Freq: Once | INTRAMUSCULAR | Status: AC
  Administered 2024-10-05: 400 mg

## 2024-10-05 MED ORDER — BUPIVACAINE HCL 0.25 % IJ SOLN
20.0000 mL | Freq: Once | INTRAMUSCULAR | Status: AC
  Administered 2024-10-05: 20 mL

## 2024-10-05 MED ORDER — SODIUM BICARBONATE 8.4 % IV SOLN
5.0000 mL | Freq: Once | INTRAVENOUS | Status: AC
  Administered 2024-10-05: 5 mL

## 2024-10-05 MED ORDER — HEPARIN SODIUM (PORCINE) 10000 UNIT/ML IJ SOLN
10000.0000 [IU] | Freq: Once | INTRAMUSCULAR | Status: AC
  Administered 2024-10-05: 10000 [IU] via INTRAVESICAL

## 2024-10-05 NOTE — Patient Instructions (Signed)
 Thank you for entrusting us  with your care. Please keep all future appointments and if you have any questions or concerns please feel free to contact our office at [phone].

## 2024-10-05 NOTE — Telephone Encounter (Signed)
 Based on a test claim through [facility], insurance is now paying for Gemtesa  through 10/04/2025

## 2024-10-05 NOTE — Telephone Encounter (Signed)
 Attempted to contact patient. [facility]

## 2024-10-05 NOTE — Progress Notes (Signed)
 Bladder Instillation: The patient was identified and verbally consented for the procedure.  The urethra was prepped with Betadine  x 3. A 16 Fr foley catheter was inserted the bladder and drained for 45 cc. The foley was then attached to a 60 mL syringe with the plunger removed.  The medication was slowly poured into the bladder via the syringe and foley.  The medication consisted of: 20ml of Lidocaine  2%, 20mL of Bupivicaine 0.25%, 10,000 units/mL Heparin , 5mL Sodium Bicarbonate  8.4%.  The foley was removed and the patient was asked to hold the liquids in her bladder for 30-60 minutes if possible.   Precautions were given and patient was instructed to call the office or on-call number for any concerns.  Summer Brewer, CMA      SUBJECTIVE:  Summer Brewer is a 54 y.o. female here for a vaginal swab.  She reports the following symptoms: discharge described as white and curd-like, odor, and vulvar itching for 2days. The patient is not having significant pelvic pain, fever, or UTI symptoms.  OBJECTIVE:  LMP 08/10/2022   Appears well, in no apparent distress.  ASSESSMENT: Vaginal swab for vaginitis testing.   PLAN: Self-collected Aptima swab for vaginitis testing and sent to lab.  Treatment will be determined when results are available for review.  The office will contact you with the results once reviewed by your provider.  Contact the office if symptoms persist and or worsen or new symptoms develop.

## 2024-10-06 ENCOUNTER — Ambulatory Visit (HOSPITAL_BASED_OUTPATIENT_CLINIC_OR_DEPARTMENT_OTHER): Admitting: Pharmacist Clinician (PhC)/ Clinical Pharmacy Specialist

## 2024-10-06 NOTE — Progress Notes (Unsigned)
 "  Office Visit    Patient Name: Summer Brewer Date of Encounter: 10/06/2024  Primary Care Provider:  Madelon Donald CHRISTELLA, DO Primary Cardiologist:  Alm Clay, MD  Chief Complaint    Hypertension  Significant Past Medical History                    Allergies[1]  History of Present Illness    Summer Brewer is a 54 y.o. female patient of Dr ***, in the office today for hypertension evaluation.   Blood Pressure Goal:  130/80  Current Medications:    Previously tried:    Family Hx:     Social Hx:      Tobacco:  Alcohol:  Caffeine: Diet:      Exercise:   Home BP readings:      Adherence Assessment  Do you ever forget to take your medication? [] Yes [] No  Do you ever skip doses due to side effects? [] Yes [] No  Do you have trouble affording your medicines? [] Yes [] No  Are you ever unable to pick up your medication due to transportation difficulties? [] Yes [] No  Do you ever stop taking your medications because you don't believe they are helping? [] Yes [] No  Do you check your weight daily? [] Yes [] No   Adherence strategy: ***  Barriers to obtaining medications: ***     Accessory Clinical Findings    Lab Results  Component Value Date   CREATININE 1.00 08/23/2024   BUN 15 08/23/2024   NA 140 08/23/2024   K 4.0 08/23/2024   CL 105 08/23/2024   CO2 27 08/23/2024   Lab Results  Component Value Date   ALT 10 09/30/2023   AST 13 09/30/2023   ALKPHOS 74 09/30/2023   BILITOT 0.3 09/30/2023   Lab Results  Component Value Date   HGBA1C 5.5 08/08/2024    Home Medications    Current Outpatient Medications  Medication Sig Dispense Refill   acetaminophen  (TYLENOL ) 500 MG tablet Take 1 tablet (500 mg total) by mouth every 6 (six) hours as needed (pain). 30 tablet 0   albuterol  (VENTOLIN  HFA) 108 (90 Base) MCG/ACT inhaler Inhale 2 puffs into the lungs every 4 (four) hours as needed. 18 g 3   amLODipine  (NORVASC ) 5 MG tablet Take 1 tablet (5  mg total) by mouth daily. 90 tablet 3   amoxicillin -clavulanate (AUGMENTIN ) 875-125 MG tablet Take 1 tablet by mouth 2 (two) times daily. 14 tablet 0   cetirizine  (ZYRTEC ) 10 MG tablet TAKE 1 TABLET BY MOUTH EVERY DAY 90 tablet 0   clobetasol  ointment (TEMOVATE ) 0.05 % Apply 1 Application topically 2 (two) times daily. Apply to areas of sever eczema twice daily until clear. 60 g 2   dapagliflozin  propanediol (FARXIGA ) 10 MG TABS tablet TAKE 1 TABLET BY MOUTH EVERY DAY 90 tablet 3   ezetimibe  (ZETIA ) 10 MG tablet Take 1 tablet (10 mg total) by mouth daily. 90 tablet 3   famotidine  (PEPCID ) 40 MG tablet TAKE 1 TABLET BY MOUTH TWICE A DAY 60 tablet 5   fluticasone  (FLONASE ) 50 MCG/ACT nasal spray PLACE 1 SPRAY INTO BOTH NOSTRILS AS NEEDED. 48 mL 1   GEMTESA  75 MG TABS TAKE 1 TABLET BY MOUTH EVERY DAY 30 tablet 5   hydrOXYzine  (ATARAX ) 10 MG tablet Take 1 tablet (10 mg total) by mouth at bedtime as needed. 100 each 1   ibuprofen  (ADVIL ) 600 MG tablet Take 1 tablet (600 mg total) by mouth every 6 (six) hours  as needed. 30 tablet 0   Meth-Hyo-M Bl-Na Phos-Ph Sal (URO-MP ) 118 MG CAPS Take 1 capsule (118 mg total) by mouth 3 (three) times daily as needed. 90 capsule 2   methocarbamol  (ROBAXIN ) 500 MG tablet Take 1 tablet (500 mg total) by mouth 3 (three) times daily. 60 tablet 0   metoprolol  succinate (TOPROL -XL) 100 MG 24 hr tablet TAKE 1 TABLET (100 MG TOTAL) BY MOUTH AT BEDTIME. TAKE WITH OR IMMEDIATELY FOLLOWING A MEAL. 90 tablet 0   Misc Natural Products (IBEROGAST) LIQD Take by mouth.     nitrofurantoin , macrocrystal-monohydrate, (MACROBID ) 100 MG capsule Take 1 capsule (100 mg total) by mouth 2 (two) times daily. 10 capsule 0   oxyCODONE  (OXY IR/ROXICODONE ) 5 MG immediate release tablet Take 1 tablet (5 mg total) by mouth every 4 (four) hours as needed for severe pain (pain score 7-10). 5 tablet 0   phenazopyridine  (PYRIDIUM ) 200 MG tablet Take 1 tablet (200 mg total) by mouth 3 (three) times daily  as needed for pain. 30 tablet 0   predniSONE  (DELTASONE ) 20 MG tablet Take 1 tablet (20 mg total) by mouth daily with breakfast. 5 tablet 0   promethazine -dextromethorphan (PROMETHAZINE -DM) 6.25-15 MG/5ML syrup Take 5 mLs by mouth 4 (four) times daily as needed. 118 mL 0   sacubitril -valsartan  (ENTRESTO ) 97-103 MG Take 1 tablet by mouth 2 (two) times daily. 180 tablet 2   scopolamine  (TRANSDERM-SCOP) 1 MG/3DAYS Place 1 patch (1.5 mg total) onto the skin every 3 (three) days. 10 patch 5   spironolactone  (ALDACTONE ) 25 MG tablet Take 1 tablet (25 mg total) by mouth daily. 90 tablet 3   tirzepatide  (ZEPBOUND ) 2.5 MG/0.5ML Pen Inject 2.5 mg into the skin once a week. 2 mL 0   triamcinolone  cream (KENALOG ) 0.5 % Apply topically 2 (two) times daily as needed. 15 g 2   No current facility-administered medications for this visit.     No BP recorded.  {Refresh Note OR Click here to enter BP  :1}***   Assessment & Plan    No problem-specific Assessment & Plan notes found for this encounter.   Jericho Alcorn PharmD CPP [facility]  [address] [place], [place] [address] [phone]      [1]  Allergies Allergen Reactions   Latex Hives and Itching   Tizanidine  Other (See Comments)    Mouth dryness   "

## 2024-10-07 LAB — CERVICOVAGINAL ANCILLARY ONLY
Bacterial Vaginitis (gardnerella): POSITIVE — AB
Candida Glabrata: NEGATIVE
Candida Vaginitis: POSITIVE — AB
Comment: NEGATIVE
Comment: NEGATIVE
Comment: NEGATIVE

## 2024-10-07 MED ORDER — FLUCONAZOLE 150 MG PO TABS: 150.0000 mg | ORAL_TABLET | Freq: Once | ORAL | 0 refills | Status: AC

## 2024-10-07 MED ORDER — METRONIDAZOLE 500 MG PO TABS: 500.0000 mg | ORAL_TABLET | Freq: Two times a day (BID) | ORAL | 0 refills | Status: AC

## 2024-10-12 ENCOUNTER — Telehealth: Payer: Self-pay

## 2024-10-12 ENCOUNTER — Ambulatory Visit

## 2024-10-12 VITALS — BP 154/89 | HR 98

## 2024-10-12 DIAGNOSIS — R350 Frequency of micturition: Secondary | ICD-10-CM

## 2024-10-12 DIAGNOSIS — R3989 Other symptoms and signs involving the genitourinary system: Secondary | ICD-10-CM

## 2024-10-12 LAB — POCT URINALYSIS DIP (CLINITEK)
Bilirubin, UA: NEGATIVE
Glucose, UA: NEGATIVE mg/dL
Ketones, POC UA: NEGATIVE mg/dL
Leukocytes, UA: NEGATIVE
Nitrite, UA: NEGATIVE
POC PROTEIN,UA: NEGATIVE
Spec Grav, UA: 1.025
Urobilinogen, UA: 0.2 U/dL
pH, UA: 5.5

## 2024-10-12 MED ORDER — HEPARIN SODIUM (PORCINE) 10000 UNIT/ML IJ SOLN
10000.0000 [IU] | Freq: Once | INTRAMUSCULAR | Status: AC
  Administered 2024-10-12: 10000 [IU] via INTRAVESICAL

## 2024-10-12 MED ORDER — LIDOCAINE HCL 2 % IJ SOLN
20.0000 mL | Freq: Once | INTRAMUSCULAR | Status: AC
  Administered 2024-10-12: 400 mg

## 2024-10-12 MED ORDER — BUPIVACAINE HCL 0.25 % IJ SOLN
20.0000 mL | Freq: Once | INTRAMUSCULAR | Status: AC
  Administered 2024-10-12: 20 mL

## 2024-10-12 MED ORDER — SODIUM BICARBONATE 8.4 % IV SOLN
5.0000 mL | Freq: Once | INTRAVENOUS | Status: AC
  Administered 2024-10-12: 5 mL

## 2024-10-12 NOTE — Progress Notes (Signed)
 Bladder Instillation: The patient was identified and verbally consented for the procedure.  The urethra was prepped with Betadine  x 3. A 16 Fr foley catheter was inserted the bladder and drained for __50__ cc. The foley was then attached to a 50 mL syringe with the plunger removed.  The medication was slowly poured into the bladder via the syringe and foley.  The medication consisted of: 20ml of Lidocaine  2%, 20mL of Bupivicaine 0.25%, 10,000 units/mL Heparin , 5mL Sodium Bicarbonate  8.4%.  The foley was removed and the patient was asked to hold the liquids in her bladder for 30-60 minutes if possible.   Precautions were given and patient was instructed to call the office or on-call number for any concerns.  Concerns today that her yeast infection has not been eradicated. She took the Diflucan , but onlt took it this past Monday 10-10-24. This also applies to her treatment of Flagyl  for her BV. She is requesting another    Andree LULLA Boas, CMA

## 2024-10-12 NOTE — Telephone Encounter (Addendum)
 During patients nurse visit today, she complains of a yeast infection that has not yet resolved. After chart review I called her to discuss her current medication for treatment due to Flagyl  being prescribed as 1 tablet 2 x a day for 7 days. She answered the phone but couldn't talk as she was at a restaurant. Asked her to please call back when she had time to talk.

## 2024-10-13 ENCOUNTER — Other Ambulatory Visit: Payer: Self-pay | Admitting: Neurology

## 2024-10-19 ENCOUNTER — Ambulatory Visit

## 2024-10-26 ENCOUNTER — Ambulatory Visit

## 2024-11-02 ENCOUNTER — Ambulatory Visit

## 2024-11-07 ENCOUNTER — Ambulatory Visit: Admitting: Cardiology

## 2024-11-08 ENCOUNTER — Encounter (HOSPITAL_COMMUNITY)

## 2024-11-09 ENCOUNTER — Ambulatory Visit: Admitting: Physician Assistant

## 2024-11-11 ENCOUNTER — Ambulatory Visit: Admitting: Neurology

## 2024-11-14 ENCOUNTER — Ambulatory Visit
